# Patient Record
Sex: Male | Born: 1955
Health system: Southern US, Community
[De-identification: ages and names within clinical notes are randomized; demographics above are authoritative.]

## PROBLEM LIST (undated history)

## (undated) DIAGNOSIS — J449 Chronic obstructive pulmonary disease, unspecified: Secondary | ICD-10-CM

## (undated) DIAGNOSIS — C801 Malignant (primary) neoplasm, unspecified: Secondary | ICD-10-CM

## (undated) DIAGNOSIS — I10 Essential (primary) hypertension: Secondary | ICD-10-CM

## (undated) HISTORY — PX: HERNIA REPAIR: SHX51

## (undated) HISTORY — DX: Chronic obstructive pulmonary disease, unspecified: J44.9

## (undated) HISTORY — PX: APPENDECTOMY: SHX54

## (undated) MED FILL — Leucovorin Calcium For Inj 350 MG: INTRAMUSCULAR | Qty: 17.6 | Status: AC

---

## 2019-06-20 DIAGNOSIS — Z0000 Encounter for general adult medical examination without abnormal findings: Secondary | ICD-10-CM | POA: Diagnosis not present

## 2019-11-24 DIAGNOSIS — K219 Gastro-esophageal reflux disease without esophagitis: Secondary | ICD-10-CM | POA: Diagnosis not present

## 2019-11-24 DIAGNOSIS — R079 Chest pain, unspecified: Secondary | ICD-10-CM | POA: Diagnosis not present

## 2019-11-24 DIAGNOSIS — Z87891 Personal history of nicotine dependence: Secondary | ICD-10-CM | POA: Diagnosis not present

## 2019-11-24 DIAGNOSIS — R112 Nausea with vomiting, unspecified: Secondary | ICD-10-CM | POA: Diagnosis not present

## 2019-11-24 DIAGNOSIS — I10 Essential (primary) hypertension: Secondary | ICD-10-CM | POA: Diagnosis not present

## 2019-11-24 DIAGNOSIS — E785 Hyperlipidemia, unspecified: Secondary | ICD-10-CM | POA: Diagnosis not present

## 2019-11-24 DIAGNOSIS — R1013 Epigastric pain: Secondary | ICD-10-CM | POA: Diagnosis not present

## 2019-11-24 DIAGNOSIS — R9431 Abnormal electrocardiogram [ECG] [EKG]: Secondary | ICD-10-CM | POA: Diagnosis not present

## 2019-11-25 DIAGNOSIS — I10 Essential (primary) hypertension: Secondary | ICD-10-CM | POA: Diagnosis not present

## 2019-11-25 DIAGNOSIS — R1013 Epigastric pain: Secondary | ICD-10-CM | POA: Diagnosis not present

## 2019-11-25 DIAGNOSIS — R9431 Abnormal electrocardiogram [ECG] [EKG]: Secondary | ICD-10-CM | POA: Diagnosis not present

## 2019-11-25 DIAGNOSIS — R112 Nausea with vomiting, unspecified: Secondary | ICD-10-CM

## 2019-11-25 DIAGNOSIS — Z87891 Personal history of nicotine dependence: Secondary | ICD-10-CM | POA: Diagnosis not present

## 2019-11-25 DIAGNOSIS — E785 Hyperlipidemia, unspecified: Secondary | ICD-10-CM | POA: Diagnosis not present

## 2019-11-25 DIAGNOSIS — K219 Gastro-esophageal reflux disease without esophagitis: Secondary | ICD-10-CM | POA: Diagnosis not present

## 2019-11-25 DIAGNOSIS — Z9049 Acquired absence of other specified parts of digestive tract: Secondary | ICD-10-CM | POA: Diagnosis not present

## 2019-11-25 DIAGNOSIS — R079 Chest pain, unspecified: Secondary | ICD-10-CM | POA: Diagnosis not present

## 2019-11-29 DIAGNOSIS — K219 Gastro-esophageal reflux disease without esophagitis: Secondary | ICD-10-CM | POA: Diagnosis not present

## 2019-11-29 DIAGNOSIS — I10 Essential (primary) hypertension: Secondary | ICD-10-CM | POA: Diagnosis not present

## 2019-11-29 DIAGNOSIS — Z131 Encounter for screening for diabetes mellitus: Secondary | ICD-10-CM | POA: Diagnosis not present

## 2019-11-29 DIAGNOSIS — Z122 Encounter for screening for malignant neoplasm of respiratory organs: Secondary | ICD-10-CM | POA: Diagnosis not present

## 2019-11-29 DIAGNOSIS — R0789 Other chest pain: Secondary | ICD-10-CM | POA: Diagnosis not present

## 2019-11-29 DIAGNOSIS — Z6830 Body mass index (BMI) 30.0-30.9, adult: Secondary | ICD-10-CM | POA: Diagnosis not present

## 2019-11-29 DIAGNOSIS — E785 Hyperlipidemia, unspecified: Secondary | ICD-10-CM | POA: Diagnosis not present

## 2019-11-29 DIAGNOSIS — Z87891 Personal history of nicotine dependence: Secondary | ICD-10-CM | POA: Diagnosis not present

## 2019-12-07 DIAGNOSIS — Z87891 Personal history of nicotine dependence: Secondary | ICD-10-CM | POA: Diagnosis not present

## 2019-12-07 DIAGNOSIS — Z122 Encounter for screening for malignant neoplasm of respiratory organs: Secondary | ICD-10-CM | POA: Diagnosis not present

## 2020-01-02 DIAGNOSIS — K219 Gastro-esophageal reflux disease without esophagitis: Secondary | ICD-10-CM | POA: Diagnosis not present

## 2020-01-02 DIAGNOSIS — Z9181 History of falling: Secondary | ICD-10-CM | POA: Diagnosis not present

## 2020-01-02 DIAGNOSIS — Z23 Encounter for immunization: Secondary | ICD-10-CM | POA: Diagnosis not present

## 2020-01-02 DIAGNOSIS — Z6831 Body mass index (BMI) 31.0-31.9, adult: Secondary | ICD-10-CM | POA: Diagnosis not present

## 2020-01-03 DIAGNOSIS — D649 Anemia, unspecified: Secondary | ICD-10-CM | POA: Diagnosis not present

## 2020-01-04 DIAGNOSIS — K219 Gastro-esophageal reflux disease without esophagitis: Secondary | ICD-10-CM

## 2020-01-04 DIAGNOSIS — Z1211 Encounter for screening for malignant neoplasm of colon: Secondary | ICD-10-CM | POA: Diagnosis not present

## 2020-01-04 DIAGNOSIS — D649 Anemia, unspecified: Secondary | ICD-10-CM | POA: Diagnosis not present

## 2020-01-04 DIAGNOSIS — Z6831 Body mass index (BMI) 31.0-31.9, adult: Secondary | ICD-10-CM | POA: Diagnosis not present

## 2020-01-04 HISTORY — DX: Gastro-esophageal reflux disease without esophagitis: K21.9

## 2020-02-29 ENCOUNTER — Encounter: Payer: Self-pay | Admitting: Family Medicine

## 2020-07-09 DIAGNOSIS — Z125 Encounter for screening for malignant neoplasm of prostate: Secondary | ICD-10-CM | POA: Diagnosis not present

## 2020-07-09 DIAGNOSIS — E785 Hyperlipidemia, unspecified: Secondary | ICD-10-CM | POA: Diagnosis not present

## 2020-07-09 DIAGNOSIS — Z79899 Other long term (current) drug therapy: Secondary | ICD-10-CM | POA: Diagnosis not present

## 2020-07-09 DIAGNOSIS — Z1211 Encounter for screening for malignant neoplasm of colon: Secondary | ICD-10-CM | POA: Diagnosis not present

## 2020-07-09 DIAGNOSIS — K219 Gastro-esophageal reflux disease without esophagitis: Secondary | ICD-10-CM | POA: Diagnosis not present

## 2020-07-09 DIAGNOSIS — Z131 Encounter for screening for diabetes mellitus: Secondary | ICD-10-CM | POA: Diagnosis not present

## 2020-07-09 DIAGNOSIS — Z1331 Encounter for screening for depression: Secondary | ICD-10-CM | POA: Diagnosis not present

## 2020-07-09 DIAGNOSIS — E669 Obesity, unspecified: Secondary | ICD-10-CM | POA: Diagnosis not present

## 2020-07-09 DIAGNOSIS — Z0000 Encounter for general adult medical examination without abnormal findings: Secondary | ICD-10-CM | POA: Diagnosis not present

## 2020-07-09 DIAGNOSIS — I10 Essential (primary) hypertension: Secondary | ICD-10-CM | POA: Diagnosis not present

## 2020-07-09 DIAGNOSIS — Z6831 Body mass index (BMI) 31.0-31.9, adult: Secondary | ICD-10-CM | POA: Diagnosis not present

## 2020-07-31 ENCOUNTER — Encounter: Payer: Self-pay | Admitting: Gastroenterology

## 2020-08-12 ENCOUNTER — Other Ambulatory Visit: Payer: Self-pay | Admitting: General Surgery

## 2020-08-20 ENCOUNTER — Other Ambulatory Visit (INDEPENDENT_AMBULATORY_CARE_PROVIDER_SITE_OTHER): Payer: PPO

## 2020-08-20 ENCOUNTER — Other Ambulatory Visit: Payer: PPO

## 2020-08-20 ENCOUNTER — Other Ambulatory Visit: Payer: Self-pay

## 2020-08-20 ENCOUNTER — Encounter: Payer: Self-pay | Admitting: Gastroenterology

## 2020-08-20 ENCOUNTER — Ambulatory Visit (INDEPENDENT_AMBULATORY_CARE_PROVIDER_SITE_OTHER): Payer: PPO | Admitting: Gastroenterology

## 2020-08-20 VITALS — BP 154/86 | HR 73 | Ht 62.0 in | Wt 179.5 lb

## 2020-08-20 DIAGNOSIS — Z1212 Encounter for screening for malignant neoplasm of rectum: Secondary | ICD-10-CM | POA: Diagnosis not present

## 2020-08-20 DIAGNOSIS — D509 Iron deficiency anemia, unspecified: Secondary | ICD-10-CM

## 2020-08-20 DIAGNOSIS — K219 Gastro-esophageal reflux disease without esophagitis: Secondary | ICD-10-CM | POA: Diagnosis not present

## 2020-08-20 DIAGNOSIS — R195 Other fecal abnormalities: Secondary | ICD-10-CM | POA: Diagnosis not present

## 2020-08-20 DIAGNOSIS — Z1211 Encounter for screening for malignant neoplasm of colon: Secondary | ICD-10-CM

## 2020-08-20 LAB — CBC
HCT: 34.4 % — ABNORMAL LOW (ref 38.5–50.0)
Hemoglobin: 12.7 g/dL — ABNORMAL LOW (ref 13.2–17.1)
MCH: 34 pg — ABNORMAL HIGH (ref 27.0–33.0)
MCHC: 36.9 g/dL — ABNORMAL HIGH (ref 32.0–36.0)
MCV: 92 fL (ref 80.0–100.0)
MPV: 10.2 fL (ref 7.5–12.5)
Platelets: 303 10*3/uL (ref 140–400)
RBC: 3.74 10*6/uL — ABNORMAL LOW (ref 4.20–5.80)
RDW: 14.1 % (ref 11.0–15.0)
WBC: 9.6 10*3/uL (ref 3.8–10.8)

## 2020-08-20 LAB — FERRITIN: Ferritin: 67.9 ng/mL (ref 22.0–322.0)

## 2020-08-20 MED ORDER — CLENPIQ 10-3.5-12 MG-GM -GM/160ML PO SOLN: 1.0000 | ORAL | 0 refills | Status: DC

## 2020-08-20 NOTE — Progress Notes (Signed)
Chief Complaint: Colon cancer screening, GERD   Referring Provider:     Serita Grammes, MD   HPI:     Alexander [name] is a 65 y.o. male with a history of COPD, HLD, HTN, GERD, referred to the Gastroenterology Clinic for evaluation of colon cancer screening and chronic GERD.  No previous colonoscopy.   Had noticed black stool approx 3 weeks ago, lasted a couple days, which then resolved.  No Pepto, p.o. iron, etc. at that time.  Does use Advil for arthralgias and LBP.    He has a long-standing hx of GERD, which had been largely well controlled. More recently with increasing nocturnal sxs (belching, regurgitation, sour brash). Also with increased daytime belching, HB, regurgitation. No dysphagia. Occasional nausea, but no emesis. Has been taking Protonix 40 mg/day for 1 year. No prior similar sxs. No previous EGD.   He reports he was diagnosed with IDA in 2021, which was normal on repeat a few months later with his PCM. Never took any supplemental iron. No labs for review.  Requested records today.  Evaluated by Cardiology in 11/2019: -TTE (11/25/2019): Concentric LVH, EF 60-65%, mild aortic valve sclerosis -CT chest (12/07/2019): Lung RADS 2, benign appearance.  Continued annual screening with CT without contrast in 12 months.  Hepatic steatosis.  Aortic atherosclerosis and emphysema.  Coronary artery atherosclerosis.  Aortic valve calcifications. -Lexiscan cardiac stress test (11/25/2018): Uneventful Lexiscan infusion, EKG changes at rest most likely related to LVH.  No reversible ischemia or infarction.  EF 70%.   Past Medical History:  Diagnosis Date  . COPD (chronic obstructive pulmonary disease) ([place])   . GERD without esophagitis 01/04/2020   [facility]     Past Surgical History:  Procedure Laterality Date  . APPENDECTOMY    . HERNIA REPAIR     age 46 or 2    Family History  Problem Relation Age of Onset  . Diabetes Sister   .  Hypertension Brother   . Cancer Neg Hx   . Stroke Neg Hx   . Heart attack Neg Hx    Social History   Tobacco Use  . Smoking status: Former Smoker    Types: Cigars    Quit date: 04/2019    Years since quitting: 1.3  . Smokeless tobacco: Never Used  Substance Use Topics  . Alcohol use: Not Currently    Comment: doesnt drink anymore. stopped in 2021  . Drug use: Not Currently   Current Outpatient Medications  Medication Sig Dispense Refill  . losartan (COZAAR) 25 MG tablet Take 25 mg by mouth every morning.    . pantoprazole (PROTONIX) 40 MG tablet Take 40 mg by mouth at bedtime.    . rosuvastatin (CRESTOR) 10 MG tablet Take 10 mg by mouth every morning.     No current facility-administered medications for this visit.   No Known Allergies   Review of Systems: All systems reviewed and negative except where noted in HPI.     Physical Exam:    Wt Readings from Last 3 Encounters:  08/20/20 179 lb 8 oz (81.4 kg)    BP (!) 154/86   Pulse 73   Ht 5\' 2"  (1.575 m)   Wt 179 lb 8 oz (81.4 kg)   BMI 32.83 kg/m  Constitutional:  Pleasant, in no acute distress. Psychiatric: Normal mood and affect. Behavior is normal. EENT: Pupils normal.  Conjunctivae are normal.  No scleral icterus. Neck supple. No cervical LAD. Cardiovascular: Normal rate, regular rhythm. No edema Pulmonary/chest: Effort normal and breath sounds normal. No wheezing, rales or rhonchi. Abdominal: Soft, nondistended, nontender. Bowel sounds active throughout. There are no masses palpable. No hepatomegaly. Neurological: Alert and oriented to person place and time. Skin: Skin is warm and dry. No rashes noted.   ASSESSMENT AND PLAN;   1) colon cancer screening -Overdue for average risk screening -Schedule colonoscopy  2) GERD 3) Nausea without emesis -Longstanding history of reflux, previously well controlled, but increasing nocturnal reflux symptoms lately despite continued Protonix 40 mg/day -EGD to  evaluate for erosive esophagitis, LES laxity, hiatal hernia, Barrett's esophagus screening -Antireflux lifestyle/dietary modifications -HOB elevation, avoid eating within 3 hours of bedtime -Avoid exacerbating foods  4) Dark stools -Isolated episode lasting a couple days of dark stools approximately 3 weeks ago.  Does take NSAIDs regularly for arthralgias -Evaluate for gastritis, PUD, etc. time EGD as above  5) Possible history of iron deficiency anemia -Requested previous records -Repeat CBC and check iron panel -Evaluate for callosal/luminal pathology time EGD/colonoscopy as above  The indications, risks, and benefits of EGD and colonoscopy were explained to the patient in detail. Risks include but are not limited to bleeding, perforation, adverse reaction to medications, and cardiopulmonary compromise. Sequelae include but are not limited to the possibility of surgery, hospitalization, and mortality. The patient verbalized understanding and wished to proceed. All questions answered, referred to scheduler and bowel prep ordered. Further recommendations pending results of the exam.     Alexander Bullion, DO, FACG  08/20/2020, 11:10 AM   Serita Grammes, MD

## 2020-08-20 NOTE — Patient Instructions (Addendum)
If you are age 66 or older, your body mass index should be between 23-30. Your Body mass index is 32.83 kg/m. If this is out of the aforementioned range listed, please consider follow up with your Primary Care Provider.  If you are age 78 or younger, your body mass index should be between 19-25. Your Body mass index is 32.83 kg/m. If this is out of the aformentioned range listed, please consider follow up with your Primary Care Provider.    Please go to the 2nd floor of this building today and schedule your labwork, [place], [address].  We have sent the following medications to your pharmacy for you to pick up at your convenience:  Clenpiq   Due to recent changes in healthcare laws, you may see the results of your imaging and laboratory studies on MyChart before your provider has had a chance to review them.  We understand that in some cases there may be results that are confusing or concerning to you. Not all laboratory results come back in the same time frame and the provider may be waiting for multiple results in order to interpret others.  Please give [place] 48 hours in order for your provider to thoroughly review all the results before contacting the office for clarification of your results.   Thank you for choosing me and [facility].  Vito Cirigliano, D.O.

## 2020-08-21 ENCOUNTER — Telehealth: Payer: Self-pay | Admitting: Gastroenterology

## 2020-08-21 LAB — IRON, TOTAL/TOTAL IRON BINDING CAP
%SAT: 21 % (calc) (ref 20–48)
Iron: 88 ug/dL (ref 50–180)
TIBC: 422 mcg/dL (calc) (ref 250–425)

## 2020-08-21 NOTE — Telephone Encounter (Signed)
LVM for the patients wife to contact me back. Left a sample of Clenpiq the front desk for the patient to pick up

## 2020-08-22 ENCOUNTER — Telehealth: Payer: Self-pay | Admitting: General Surgery

## 2020-08-22 NOTE — Telephone Encounter (Signed)
-----   Message from [place], DO sent at 08/21/2020  4:30 PM EDT ----- Hemoglobin very mildly reduced at 12.7, but otherwise normal CBC.  Normal iron panel.  No need to start iron therapy.

## 2020-08-22 NOTE — Telephone Encounter (Signed)
Left a voicemail for the patient advising him that he hand a mildly reduced hemoglobin. No need to start iron. They also had requested a cheaper prep for his colonoscopy. Left a prep at the front desk in [place] for him.

## 2020-09-05 DIAGNOSIS — K5730 Diverticulosis of large intestine without perforation or abscess without bleeding: Secondary | ICD-10-CM | POA: Diagnosis not present

## 2020-09-05 DIAGNOSIS — Z8249 Family history of ischemic heart disease and other diseases of the circulatory system: Secondary | ICD-10-CM | POA: Diagnosis not present

## 2020-09-05 DIAGNOSIS — R1084 Generalized abdominal pain: Secondary | ICD-10-CM | POA: Diagnosis not present

## 2020-09-05 DIAGNOSIS — C169 Malignant neoplasm of stomach, unspecified: Secondary | ICD-10-CM | POA: Diagnosis present

## 2020-09-05 DIAGNOSIS — D696 Thrombocytopenia, unspecified: Secondary | ICD-10-CM | POA: Diagnosis not present

## 2020-09-05 DIAGNOSIS — Z20822 Contact with and (suspected) exposure to covid-19: Secondary | ICD-10-CM | POA: Diagnosis not present

## 2020-09-05 DIAGNOSIS — K3189 Other diseases of stomach and duodenum: Secondary | ICD-10-CM | POA: Diagnosis not present

## 2020-09-05 DIAGNOSIS — D649 Anemia, unspecified: Secondary | ICD-10-CM | POA: Diagnosis not present

## 2020-09-05 DIAGNOSIS — D72829 Elevated white blood cell count, unspecified: Secondary | ICD-10-CM | POA: Diagnosis not present

## 2020-09-05 DIAGNOSIS — K259 Gastric ulcer, unspecified as acute or chronic, without hemorrhage or perforation: Secondary | ICD-10-CM | POA: Diagnosis not present

## 2020-09-05 DIAGNOSIS — K222 Esophageal obstruction: Secondary | ICD-10-CM | POA: Diagnosis not present

## 2020-09-05 DIAGNOSIS — Z6830 Body mass index (BMI) 30.0-30.9, adult: Secondary | ICD-10-CM | POA: Diagnosis not present

## 2020-09-05 DIAGNOSIS — E669 Obesity, unspecified: Secondary | ICD-10-CM | POA: Diagnosis not present

## 2020-09-05 DIAGNOSIS — E441 Mild protein-calorie malnutrition: Secondary | ICD-10-CM | POA: Diagnosis not present

## 2020-09-05 DIAGNOSIS — R079 Chest pain, unspecified: Secondary | ICD-10-CM | POA: Diagnosis not present

## 2020-09-05 DIAGNOSIS — R1013 Epigastric pain: Secondary | ICD-10-CM | POA: Diagnosis not present

## 2020-09-05 DIAGNOSIS — Z87891 Personal history of nicotine dependence: Secondary | ICD-10-CM | POA: Diagnosis not present

## 2020-09-05 DIAGNOSIS — Z808 Family history of malignant neoplasm of other organs or systems: Secondary | ICD-10-CM | POA: Diagnosis not present

## 2020-09-05 DIAGNOSIS — R932 Abnormal findings on diagnostic imaging of liver and biliary tract: Secondary | ICD-10-CM | POA: Diagnosis not present

## 2020-09-05 DIAGNOSIS — E785 Hyperlipidemia, unspecified: Secondary | ICD-10-CM | POA: Diagnosis not present

## 2020-09-05 DIAGNOSIS — K5669 Other partial intestinal obstruction: Secondary | ICD-10-CM | POA: Diagnosis not present

## 2020-09-05 DIAGNOSIS — M1990 Unspecified osteoarthritis, unspecified site: Secondary | ICD-10-CM | POA: Diagnosis not present

## 2020-09-05 DIAGNOSIS — K219 Gastro-esophageal reflux disease without esophagitis: Secondary | ICD-10-CM | POA: Diagnosis not present

## 2020-09-05 DIAGNOSIS — E46 Unspecified protein-calorie malnutrition: Secondary | ICD-10-CM | POA: Diagnosis not present

## 2020-09-05 DIAGNOSIS — K311 Adult hypertrophic pyloric stenosis: Secondary | ICD-10-CM | POA: Diagnosis not present

## 2020-09-05 DIAGNOSIS — K264 Chronic or unspecified duodenal ulcer with hemorrhage: Secondary | ICD-10-CM | POA: Diagnosis not present

## 2020-09-05 DIAGNOSIS — R112 Nausea with vomiting, unspecified: Secondary | ICD-10-CM | POA: Diagnosis not present

## 2020-09-05 DIAGNOSIS — K254 Chronic or unspecified gastric ulcer with hemorrhage: Secondary | ICD-10-CM | POA: Diagnosis not present

## 2020-09-05 DIAGNOSIS — I10 Essential (primary) hypertension: Secondary | ICD-10-CM | POA: Diagnosis not present

## 2020-09-05 DIAGNOSIS — Z4682 Encounter for fitting and adjustment of non-vascular catheter: Secondary | ICD-10-CM | POA: Diagnosis not present

## 2020-09-05 DIAGNOSIS — Z6831 Body mass index (BMI) 31.0-31.9, adult: Secondary | ICD-10-CM | POA: Diagnosis not present

## 2020-09-05 DIAGNOSIS — E876 Hypokalemia: Secondary | ICD-10-CM | POA: Diagnosis not present

## 2020-09-05 DIAGNOSIS — Z79899 Other long term (current) drug therapy: Secondary | ICD-10-CM | POA: Diagnosis not present

## 2020-09-05 DIAGNOSIS — K922 Gastrointestinal hemorrhage, unspecified: Secondary | ICD-10-CM | POA: Diagnosis not present

## 2020-09-05 DIAGNOSIS — K760 Fatty (change of) liver, not elsewhere classified: Secondary | ICD-10-CM | POA: Diagnosis not present

## 2020-09-05 DIAGNOSIS — Z806 Family history of leukemia: Secondary | ICD-10-CM | POA: Diagnosis not present

## 2020-09-05 DIAGNOSIS — C7889 Secondary malignant neoplasm of other digestive organs: Secondary | ICD-10-CM | POA: Diagnosis not present

## 2020-09-05 DIAGNOSIS — E871 Hypo-osmolality and hyponatremia: Secondary | ICD-10-CM | POA: Diagnosis not present

## 2020-09-05 DIAGNOSIS — D500 Iron deficiency anemia secondary to blood loss (chronic): Secondary | ICD-10-CM | POA: Diagnosis not present

## 2020-09-05 DIAGNOSIS — Z833 Family history of diabetes mellitus: Secondary | ICD-10-CM | POA: Diagnosis not present

## 2020-09-05 DIAGNOSIS — Z23 Encounter for immunization: Secondary | ICD-10-CM | POA: Diagnosis present

## 2020-09-05 DIAGNOSIS — J449 Chronic obstructive pulmonary disease, unspecified: Secondary | ICD-10-CM | POA: Diagnosis not present

## 2020-09-05 DIAGNOSIS — C163 Malignant neoplasm of pyloric antrum: Secondary | ICD-10-CM | POA: Diagnosis not present

## 2020-09-12 ENCOUNTER — Inpatient Hospital Stay (HOSPITAL_COMMUNITY): Payer: PPO

## 2020-09-12 ENCOUNTER — Encounter (HOSPITAL_COMMUNITY): Payer: Self-pay | Admitting: Internal Medicine

## 2020-09-12 ENCOUNTER — Inpatient Hospital Stay (HOSPITAL_COMMUNITY)
Admission: AD | Admit: 2020-09-12 | Discharge: 2020-10-02 | DRG: 326 | Disposition: A | Discharge status: Home / Self Care | Payer: PPO | Source: Other Acute Inpatient Hospital | Attending: Internal Medicine | Admitting: Internal Medicine

## 2020-09-12 DIAGNOSIS — Z6831 Body mass index (BMI) 31.0-31.9, adult: Secondary | ICD-10-CM | POA: Diagnosis not present

## 2020-09-12 DIAGNOSIS — Z87891 Personal history of nicotine dependence: Secondary | ICD-10-CM | POA: Diagnosis not present

## 2020-09-12 DIAGNOSIS — J449 Chronic obstructive pulmonary disease, unspecified: Secondary | ICD-10-CM | POA: Diagnosis present

## 2020-09-12 DIAGNOSIS — R059 Cough, unspecified: Secondary | ICD-10-CM | POA: Diagnosis not present

## 2020-09-12 DIAGNOSIS — Z833 Family history of diabetes mellitus: Secondary | ICD-10-CM

## 2020-09-12 DIAGNOSIS — E46 Unspecified protein-calorie malnutrition: Secondary | ICD-10-CM | POA: Diagnosis not present

## 2020-09-12 DIAGNOSIS — K5669 Other partial intestinal obstruction: Secondary | ICD-10-CM | POA: Diagnosis not present

## 2020-09-12 DIAGNOSIS — Z20822 Contact with and (suspected) exposure to covid-19: Secondary | ICD-10-CM | POA: Diagnosis not present

## 2020-09-12 DIAGNOSIS — K254 Chronic or unspecified gastric ulcer with hemorrhage: Secondary | ICD-10-CM | POA: Diagnosis not present

## 2020-09-12 DIAGNOSIS — E876 Hypokalemia: Secondary | ICD-10-CM | POA: Diagnosis not present

## 2020-09-12 DIAGNOSIS — K760 Fatty (change of) liver, not elsewhere classified: Secondary | ICD-10-CM | POA: Diagnosis not present

## 2020-09-12 DIAGNOSIS — Z0189 Encounter for other specified special examinations: Secondary | ICD-10-CM

## 2020-09-12 DIAGNOSIS — E669 Obesity, unspecified: Secondary | ICD-10-CM | POA: Diagnosis not present

## 2020-09-12 DIAGNOSIS — C169 Malignant neoplasm of stomach, unspecified: Secondary | ICD-10-CM | POA: Diagnosis not present

## 2020-09-12 DIAGNOSIS — E441 Mild protein-calorie malnutrition: Secondary | ICD-10-CM | POA: Diagnosis not present

## 2020-09-12 DIAGNOSIS — Z808 Family history of malignant neoplasm of other organs or systems: Secondary | ICD-10-CM | POA: Diagnosis not present

## 2020-09-12 DIAGNOSIS — Z23 Encounter for immunization: Secondary | ICD-10-CM | POA: Diagnosis not present

## 2020-09-12 DIAGNOSIS — Z8249 Family history of ischemic heart disease and other diseases of the circulatory system: Secondary | ICD-10-CM | POA: Diagnosis not present

## 2020-09-12 DIAGNOSIS — Z85028 Personal history of other malignant neoplasm of stomach: Secondary | ICD-10-CM | POA: Diagnosis not present

## 2020-09-12 DIAGNOSIS — E871 Hypo-osmolality and hyponatremia: Secondary | ICD-10-CM | POA: Diagnosis not present

## 2020-09-12 DIAGNOSIS — E785 Hyperlipidemia, unspecified: Secondary | ICD-10-CM | POA: Diagnosis not present

## 2020-09-12 DIAGNOSIS — D696 Thrombocytopenia, unspecified: Secondary | ICD-10-CM | POA: Diagnosis not present

## 2020-09-12 DIAGNOSIS — C7889 Secondary malignant neoplasm of other digestive organs: Secondary | ICD-10-CM | POA: Diagnosis not present

## 2020-09-12 DIAGNOSIS — R0602 Shortness of breath: Secondary | ICD-10-CM | POA: Diagnosis not present

## 2020-09-12 DIAGNOSIS — R112 Nausea with vomiting, unspecified: Secondary | ICD-10-CM

## 2020-09-12 DIAGNOSIS — I10 Essential (primary) hypertension: Secondary | ICD-10-CM | POA: Diagnosis not present

## 2020-09-12 DIAGNOSIS — Z452 Encounter for adjustment and management of vascular access device: Secondary | ICD-10-CM | POA: Diagnosis not present

## 2020-09-12 DIAGNOSIS — D500 Iron deficiency anemia secondary to blood loss (chronic): Secondary | ICD-10-CM | POA: Diagnosis not present

## 2020-09-12 DIAGNOSIS — Z6830 Body mass index (BMI) 30.0-30.9, adult: Secondary | ICD-10-CM

## 2020-09-12 DIAGNOSIS — K311 Adult hypertrophic pyloric stenosis: Secondary | ICD-10-CM | POA: Diagnosis not present

## 2020-09-12 DIAGNOSIS — K219 Gastro-esophageal reflux disease without esophagitis: Secondary | ICD-10-CM | POA: Diagnosis present

## 2020-09-12 DIAGNOSIS — Z806 Family history of leukemia: Secondary | ICD-10-CM | POA: Diagnosis not present

## 2020-09-12 DIAGNOSIS — D649 Anemia, unspecified: Secondary | ICD-10-CM | POA: Diagnosis not present

## 2020-09-12 DIAGNOSIS — Z4682 Encounter for fitting and adjustment of non-vascular catheter: Secondary | ICD-10-CM | POA: Diagnosis not present

## 2020-09-12 DIAGNOSIS — R1114 Bilious vomiting: Secondary | ICD-10-CM | POA: Diagnosis not present

## 2020-09-12 LAB — COMPREHENSIVE METABOLIC PANEL
ALT: 33 U/L (ref 0–44)
AST: 28 U/L (ref 15–41)
Albumin: 3.4 g/dL — ABNORMAL LOW (ref 3.5–5.0)
Alkaline Phosphatase: 68 U/L (ref 38–126)
Anion gap: 13 (ref 5–15)
BUN: 9 mg/dL (ref 8–23)
CO2: 18 mmol/L — ABNORMAL LOW (ref 22–32)
Calcium: 9.1 mg/dL (ref 8.9–10.3)
Chloride: 108 mmol/L (ref 98–111)
Creatinine, Ser: 0.99 mg/dL (ref 0.61–1.24)
GFR, Estimated: >60 mL/min (ref >60)
Glucose, Bld: 107 mg/dL — ABNORMAL HIGH (ref 70–99)
Potassium: 3.8 mmol/L (ref 3.5–5.1)
Sodium: 139 mmol/L (ref 135–145)
Total Bilirubin: 1.3 mg/dL — ABNORMAL HIGH (ref 0.3–1.2)
Total Protein: 6.6 g/dL (ref 6.5–8.1)

## 2020-09-12 LAB — CBC WITH DIFFERENTIAL/PLATELET
Abs Immature Granulocytes: 0.11 10*3/uL — ABNORMAL HIGH (ref 0.00–0.07)
Basophils Absolute: 0.1 10*3/uL (ref 0.0–0.1)
Basophils Relative: 1 %
Eosinophils Absolute: 0.2 10*3/uL (ref 0.0–0.5)
Eosinophils Relative: 1 %
HCT: 35.4 % — ABNORMAL LOW (ref 39.0–52.0)
Hemoglobin: 12 g/dL — ABNORMAL LOW (ref 13.0–17.0)
Immature Granulocytes: 1 %
Lymphocytes Relative: 16 %
Lymphs Abs: 2.1 10*3/uL (ref 0.7–4.0)
MCH: 32 pg (ref 26.0–34.0)
MCHC: 33.9 g/dL (ref 30.0–36.0)
MCV: 94.4 fL (ref 80.0–100.0)
Monocytes Absolute: 1.2 10*3/uL — ABNORMAL HIGH (ref 0.1–1.0)
Monocytes Relative: 9 %
Neutro Abs: 9.6 10*3/uL — ABNORMAL HIGH (ref 1.7–7.7)
Neutrophils Relative %: 72 %
Platelets: 360 10*3/uL (ref 150–400)
RBC: 3.75 MIL/uL — ABNORMAL LOW (ref 4.22–5.81)
RDW: 14.9 % (ref 11.5–15.5)
WBC: 13.2 10*3/uL — ABNORMAL HIGH (ref 4.0–10.5)
nRBC: 0 % (ref 0.0–0.2)

## 2020-09-12 LAB — MAGNESIUM: Magnesium: 2 mg/dL (ref 1.7–2.4)

## 2020-09-12 MED ORDER — POTASSIUM CHLORIDE 2 MEQ/ML IV SOLN
INTRAVENOUS | Status: DC
  Filled 2020-09-12: qty 1000

## 2020-09-12 MED ORDER — ACETAMINOPHEN 650 MG RE SUPP: 650.0000 mg | Freq: Four times a day (QID) | RECTAL | Status: DC | PRN

## 2020-09-12 MED ORDER — ONDANSETRON HCL 4 MG/2ML IJ SOLN
4.0000 mg | Freq: Four times a day (QID) | INTRAMUSCULAR | Status: DC | PRN
  Administered 2020-09-17 – 2020-10-02 (×8): 4 mg via INTRAVENOUS
  Filled 2020-09-12 (×9): qty 2

## 2020-09-12 MED ORDER — LABETALOL HCL 5 MG/ML IV SOLN
10.0000 mg | INTRAVENOUS | Status: DC | PRN
  Administered 2020-09-13: 10 mg via INTRAVENOUS
  Filled 2020-09-12: qty 4

## 2020-09-12 MED ORDER — PANTOPRAZOLE SODIUM 40 MG IV SOLR
40.0000 mg | Freq: Two times a day (BID) | INTRAVENOUS | Status: DC
  Administered 2020-09-13 – 2020-09-26 (×27): 40 mg via INTRAVENOUS
  Filled 2020-09-12 (×27): qty 40

## 2020-09-12 MED ORDER — ONDANSETRON HCL 4 MG PO TABS: 4.0000 mg | ORAL_TABLET | Freq: Four times a day (QID) | ORAL | Status: DC | PRN

## 2020-09-12 MED ORDER — ACETAMINOPHEN 325 MG PO TABS: 650.0000 mg | ORAL_TABLET | Freq: Four times a day (QID) | ORAL | Status: DC | PRN

## 2020-09-12 NOTE — H&P (Signed)
History and Physical    Alexander [name] [name] GOT:[id number] DOB: [birth year] DOA: 09/12/2020  PCP: Serita Grammes, MD  Patient coming from: [facility]  I have personally briefly reviewed patient's old medical records in [place]  Chief Complaint: Gastric malignancy  HPI: Alexander [name] is a 65 y.o. male with medical history significant for COPD, HTN, HLD, GERD who was admitted from [facility] for management of newly diagnosed gastric malignancy with gastric outlet obstruction.  Physical chart/records from [facility] reviewed.  Initially admitted at [facility] 4/21 for abdominal bloating with 2 episodes of large-volume emesis, and melena. CT Abd/Pelvis showed nodular thickening of the distal gastric mucosa involving antrum and pylorus concerning for infiltrative neoplasm w/ associated gastric outlet obstruction. Patient was initially treated with IV PPI, IV octreotide. He did not require blood transfusion during his hospital stay.  GI were consulted and he underwent EGD on 4/22. 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 Biopsy was performed. NG tube was placed post EGD and he has maintained n.p.o. status.   Pathology resulted today 4/28 consistent with adenocarcinoma. Recommendation was to transfer to [facility] for surgery evaluation.  On arrival, patient is feeling well.  He did not have any further nausea, vomiting, or abdominal discomfort.  He says he did have output via his NG tube earlier today.  He has not had any recent bowel movement which he attributes to not eating anything over the last week.  He has not seen any further obvious bleeding since he was initially admitted at [facility].  He otherwise denies any chest pain, dyspnea, or dysuria.  He reports former tobacco use of 35-40 pack years.  Quit 1.5 years ago.  Also former  alcohol use, sixpack of beer on almost daily basis.  He quit drinking alcohol about a year ago.  He reports a history of brain cancer in his father.  Review of Systems: All systems reviewed and are negative except as documented in history of present illness above.   Past Medical History:  Diagnosis Date  . COPD (chronic obstructive pulmonary disease) ([place])   . GERD without esophagitis 01/04/2020   [facility]    Past Surgical History:  Procedure Laterality Date  . APPENDECTOMY    . HERNIA REPAIR     age 89 or 2     Social History:  reports that he quit smoking about 16 months ago. His smoking use included cigars. He has never used smokeless tobacco. He reports previous alcohol use. He reports previous drug use.  No Known Allergies  Family History  Problem Relation Age of Onset  . Diabetes Sister   . Hypertension Brother   . Brain cancer Father   . Cancer Neg Hx   . Stroke Neg Hx   . Heart attack Neg Hx      Prior to Admission medications   Medication Sig Start Date End Date Taking? Authorizing Provider  losartan (COZAAR) 25 MG tablet Take 25 mg by mouth every morning. 06/25/20   [provider]  pantoprazole (PROTONIX) 40 MG tablet Take 40 mg by mouth at bedtime. 06/25/20   [provider]  rosuvastatin (CRESTOR) 10 MG tablet Take 10 mg by mouth every morning. 06/25/20   [provider]  Sod Picosulfate-Mag Ox-Cit Acd (CLENPIQ) 10-3.5-12 MG-GM -GM/160ML SOLN Take 1 kit by mouth as directed. 08/20/20   Cirigliano, Luanna Salk  V, DO    Physical Exam: Vitals:   09/12/20 2056  BP: (!) 174/94  Pulse: 81  Resp: 18  Temp: 97.6 F (36.4 C)  TempSrc: Oral  SpO2: 98%   Constitutional: Resting in bed with head elevated, NAD, calm, comfortable Eyes: PERRL, lids and conjunctivae normal ENMT: NG tube in place right nares.  Mucous membranes are moist. Posterior pharynx clear of any exudate or lesions.Normal dentition.  Neck: normal, supple, no  masses. Respiratory: clear to auscultation bilaterally, no wheezing, no crackles. Normal respiratory effort. No accessory muscle use.  Cardiovascular: Regular rate and rhythm, no murmurs / rubs / gallops. No extremity edema. 2+ pedal pulses. Abdomen: no tenderness, no masses palpated. No hepatosplenomegaly. Bowel sounds diminished.  Musculoskeletal: no clubbing / cyanosis. No joint deformity upper and lower extremities. Good ROM, no contractures. Normal muscle tone.  Skin: no rashes, lesions, ulcers. No induration Neurologic: CN 2-12 grossly intact. Sensation intact. Strength 5/5 in all 4.  Psychiatric: Normal judgment and insight. Alert and oriented x 3. Normal mood.  Labs on Admission: I have personally reviewed following labs and imaging studies  CBC: No results for input(s): WBC, NEUTROABS, HGB, HCT, MCV, PLT in the last 168 hours. Basic Metabolic Panel: Recent Labs  Lab 09/12/20 2135  NA 139  K 3.8  CL 108  CO2 18*  GLUCOSE 107*  BUN 9  CREATININE 0.99  CALCIUM 9.1  MG 2.0   GFR: CrCl cannot be calculated (Unknown ideal weight.). Liver Function Tests: Recent Labs  Lab 09/12/20 2135  AST 28  ALT 33  ALKPHOS 68  BILITOT 1.3*  PROT 6.6  ALBUMIN 3.4*   No results for input(s): LIPASE, AMYLASE in the last 168 hours. No results for input(s): AMMONIA in the last 168 hours. Coagulation Profile: No results for input(s): INR, PROTIME in the last 168 hours. Cardiac Enzymes: No results for input(s): CKTOTAL, CKMB, CKMBINDEX, TROPONINI in the last 168 hours. BNP (last 3 results) No results for input(s): PROBNP in the last 8760 hours. HbA1C: No results for input(s): HGBA1C in the last 72 hours. CBG: No results for input(s): GLUCAP in the last 168 hours. Lipid Profile: No results for input(s): CHOL, HDL, LDLCALC, TRIG, CHOLHDL, LDLDIRECT in the last 72 hours. Thyroid Function Tests: No results for input(s): TSH, T4TOTAL, FREET4, T3FREE, THYROIDAB in the last 72  hours. Anemia Panel: No results for input(s): VITAMINB12, FOLATE, FERRITIN, TIBC, IRON, RETICCTPCT in the last 72 hours. Urine analysis: No results found for: COLORURINE, APPEARANCEUR, LABSPEC, PHURINE, GLUCOSEU, HGBUR, BILIRUBINUR, KETONESUR, PROTEINUR, UROBILINOGEN, NITRITE, LEUKOCYTESUR  Radiological Exams on Admission: No results found.  EKG: Not performed.  Assessment/Plan Principal Problem:   Gastric adenocarcinoma (HCC) Active Problems:   Gastric outlet obstruction   Essential hypertension   THADDEUS EVITTS [name] is a 65 y.o. male with medical history significant for COPD, HTN, HLD, GERD who is admitted with gastric adenocarcinoma with associated gastric outlet obstruction.  Ulcerative gastric adenocarcinoma with associated gastric outlet obstruction: Transfer from [place] EGD 4/22 which showed a large antral ulcer with GOO.  Pathology resulted 4/28 showing adenocarcinoma. -Keep n.p.o. -Check KUB -Continue NG tube with low intermittent suction -Continue IV Protonix 40 mg twice daily -Continue maintenance IV fluids overnight -Will need to consult general surgery and oncology in a.m.  GI bleed: Present at initial admission to [facility] due to gastric ulcer.  Did not require blood transfusion.  Continue to monitor hemoglobin and transfuse if <7.0 or has large volume GI bleeding.  Continue IV PPI.  Hypertension: Home antihypertensives on hold while NPO.  Continue IV labetalol as needed.  DVT prophylaxis: SCDs Code Status: Full code, confirmed with patient Family Communication: Discussed with patient, he has discussed with family Disposition Plan: Likely discharge to home pending further surgical and oncological evaluation Consults called: Will need general surgery and oncology consult in a.m. Level of care: Med-Surg Admission status:  Status is: Inpatient  Remains inpatient appropriate because:Ongoing diagnostic testing needed not appropriate  for outpatient work up  Dispo: The patient is from: Home              Anticipated d/c is to: Home              Patient currently is not medically stable to d/c.   Difficult to place patient No  Zada Finders MD [facility]  If 7PM-7AM, please contact night-coverage [email or web address]  09/12/2020, 10:46 PM

## 2020-09-13 ENCOUNTER — Inpatient Hospital Stay: Payer: Self-pay

## 2020-09-13 ENCOUNTER — Encounter (HOSPITAL_COMMUNITY): Payer: Self-pay | Admitting: Internal Medicine

## 2020-09-13 ENCOUNTER — Inpatient Hospital Stay (HOSPITAL_COMMUNITY): Payer: PPO

## 2020-09-13 ENCOUNTER — Other Ambulatory Visit: Payer: Self-pay

## 2020-09-13 DIAGNOSIS — C169 Malignant neoplasm of stomach, unspecified: Principal | ICD-10-CM

## 2020-09-13 DIAGNOSIS — K5669 Other partial intestinal obstruction: Secondary | ICD-10-CM | POA: Diagnosis not present

## 2020-09-13 DIAGNOSIS — K760 Fatty (change of) liver, not elsewhere classified: Secondary | ICD-10-CM

## 2020-09-13 DIAGNOSIS — D649 Anemia, unspecified: Secondary | ICD-10-CM

## 2020-09-13 DIAGNOSIS — E785 Hyperlipidemia, unspecified: Secondary | ICD-10-CM

## 2020-09-13 DIAGNOSIS — I10 Essential (primary) hypertension: Secondary | ICD-10-CM

## 2020-09-13 DIAGNOSIS — J449 Chronic obstructive pulmonary disease, unspecified: Secondary | ICD-10-CM | POA: Diagnosis not present

## 2020-09-13 DIAGNOSIS — E441 Mild protein-calorie malnutrition: Secondary | ICD-10-CM

## 2020-09-13 LAB — BASIC METABOLIC PANEL
Anion gap: 11 (ref 5–15)
BUN: 9 mg/dL (ref 8–23)
CO2: 21 mmol/L — ABNORMAL LOW (ref 22–32)
Calcium: 9.4 mg/dL (ref 8.9–10.3)
Chloride: 108 mmol/L (ref 98–111)
Creatinine, Ser: 0.95 mg/dL (ref 0.61–1.24)
GFR, Estimated: >60 mL/min (ref >60)
Glucose, Bld: 105 mg/dL — ABNORMAL HIGH (ref 70–99)
Potassium: 4 mmol/L (ref 3.5–5.1)
Sodium: 140 mmol/L (ref 135–145)

## 2020-09-13 LAB — CBC
HCT: 32.9 % — ABNORMAL LOW (ref 39.0–52.0)
Hemoglobin: 11.8 g/dL — ABNORMAL LOW (ref 13.0–17.0)
MCH: 33.1 pg (ref 26.0–34.0)
MCHC: 35.9 g/dL (ref 30.0–36.0)
MCV: 92.4 fL (ref 80.0–100.0)
Platelets: 355 10*3/uL (ref 150–400)
RBC: 3.56 MIL/uL — ABNORMAL LOW (ref 4.22–5.81)
RDW: 15 % (ref 11.5–15.5)
WBC: 13.5 10*3/uL — ABNORMAL HIGH (ref 4.0–10.5)
nRBC: 0 % (ref 0.0–0.2)

## 2020-09-13 LAB — HEMOGLOBIN A1C
Hgb A1c MFr Bld: 5.6 % (ref 4.8–5.6)
Mean Plasma Glucose: 114.02 mg/dL

## 2020-09-13 LAB — GLUCOSE, CAPILLARY
Glucose-Capillary: 114 mg/dL — ABNORMAL HIGH (ref 70–99)
Glucose-Capillary: 96 mg/dL (ref 70–99)
Glucose-Capillary: 129 mg/dL — ABNORMAL HIGH (ref 70–99)
Glucose-Capillary: 141 mg/dL — ABNORMAL HIGH (ref 70–99)

## 2020-09-13 LAB — HIV ANTIBODY (ROUTINE TESTING W REFLEX): HIV Screen 4th Generation wRfx: NONREACTIVE

## 2020-09-13 LAB — PROTIME-INR
INR: 1 (ref 0.8–1.2)
Prothrombin Time: 13.4 seconds (ref 11.4–15.2)

## 2020-09-13 LAB — TYPE AND SCREEN
ABO/RH(D): O POS
Antibody Screen: NEGATIVE

## 2020-09-13 LAB — PHOSPHORUS: Phosphorus: 3.3 mg/dL (ref 2.5–4.6)

## 2020-09-13 LAB — SARS CORONAVIRUS 2 (TAT 6-24 HRS): SARS Coronavirus 2: NEGATIVE

## 2020-09-13 LAB — PREALBUMIN: Prealbumin: 7.2 mg/dL — ABNORMAL LOW (ref 18–38)

## 2020-09-13 LAB — ABO/RH: ABO/RH(D): O POS

## 2020-09-13 MED ORDER — LACTATED RINGERS IV SOLN: INTRAVENOUS | Status: DC

## 2020-09-13 MED ORDER — INSULIN ASPART 100 UNIT/ML IJ SOLN
0.0000 [IU] | Freq: Four times a day (QID) | INTRAMUSCULAR | Status: DC
  Administered 2020-09-13 – 2020-09-15 (×7): 1 [IU] via SUBCUTANEOUS
  Administered 2020-09-15: 2 [IU] via SUBCUTANEOUS
  Administered 2020-09-16 – 2020-09-17 (×4): 1 [IU] via SUBCUTANEOUS

## 2020-09-13 MED ORDER — TRAVASOL 10 % IV SOLN
INTRAVENOUS | Status: AC
  Filled 2020-09-13: qty 449.4

## 2020-09-13 MED ORDER — CHLORHEXIDINE GLUCONATE CLOTH 2 % EX PADS
6.0000 | MEDICATED_PAD | Freq: Every day | CUTANEOUS | Status: DC
  Administered 2020-09-13 – 2020-10-02 (×20): 6 via TOPICAL

## 2020-09-13 MED ORDER — POTASSIUM CHLORIDE 2 MEQ/ML IV SOLN
INTRAVENOUS | Status: AC
  Filled 2020-09-13 (×2): qty 1000

## 2020-09-13 MED ORDER — CLONIDINE HCL 0.2 MG/24HR TD PTWK
0.2000 mg | MEDICATED_PATCH | TRANSDERMAL | Status: DC
  Administered 2020-09-13: 0.2 mg via TRANSDERMAL
  Filled 2020-09-13: qty 1

## 2020-09-13 MED ORDER — SODIUM CHLORIDE 0.9% FLUSH
10.0000 mL | Freq: Two times a day (BID) | INTRAVENOUS | Status: DC
  Administered 2020-09-13 (×2): 10 mL

## 2020-09-13 MED ORDER — SODIUM CHLORIDE 0.9% FLUSH
10.0000 mL | INTRAVENOUS | Status: DC | PRN
  Administered 2020-09-14 – 2020-10-01 (×4): 10 mL

## 2020-09-13 MED ORDER — HEPARIN SODIUM (PORCINE) 5000 UNIT/ML IJ SOLN
5000.0000 [IU] | Freq: Three times a day (TID) | INTRAMUSCULAR | Status: DC
  Administered 2020-09-13 – 2020-09-23 (×24): 5000 [IU] via SUBCUTANEOUS
  Filled 2020-09-13 (×25): qty 1

## 2020-09-13 MED ORDER — TRACE MINERALS CU-MN-SE-ZN 300-55-60-3000 MCG/ML IV SOLN
INTRAVENOUS | Status: DC
  Filled 2020-09-13: qty 898.8

## 2020-09-13 NOTE — Progress Notes (Signed)
PT Cancellation Note  Patient Details Name: Alexander [name] MRN: [id number] DOB: 11-Sep-1955   Cancelled Treatment:    Reason Eval/Treat Not Completed: PT screened, no needs identified, will sign off.  Pt currently has no acute PT needs, but relays to me he is going to have surgery on Monday 09/16/20.  I anticipate PT/OT would be beneficial post-op, so will hold evaluation until after his surgery on Monday 09/16/20 scheduled for 07:15 am.  See OT evaluation for current mobility details.   Thanks,  Verdene Lennert, PT, DPT  Acute Rehabilitation [phone] pager #[phone] office       Barbarann Ehlers Alannie Amodio 09/13/2020, 12:24 PM

## 2020-09-13 NOTE — Evaluation (Signed)
Occupational Therapy Evaluation Patient Details Name: Alexander [name] MRN: [id number] DOB: June 20, 1955 Today's Date: 09/13/2020    History of Present Illness Mr. Garske is a 65 yo male with a history of HTN and GERD who developed nausea and early satiety about a month ago. His symptoms progressed until he was able to tolerate only a few bites of food per day. He also began having large volume emesis, as well as dark tarry stools. He was admitted at [facility] on 4/21, and a CT scan showed significant gastric distension, consistent with gastric outlet obstruction. He was mildly anemic but did not require any blood transfusions. An NG tube was placed. He underwent an EGD on 4/22, which showed a 1.5cm ulcer in the antrum with obstruction of the pylorus. The pyloric channel could only be traversed after dilations. A biopsy of the ulcer was performed, and yesterday the pathology came back as moderately-differentiated adenocarcinoma   Clinical Impression   Pt in recliner upon arrival with wife present. Pt moving well and is Ind with mobility, ambulating to bathroom, transfers and all selfcare; no physical assist required. Pt with no LOB observed during mobility. All education completed and no further acute OT services are indicated at this time. OT will sign off    Follow Up Recommendations  No OT follow up    Equipment Recommendations  None recommended by OT    Recommendations for Other Services       Precautions / Restrictions Precautions Precautions: None Precaution Comments: NG tube Restrictions Weight Bearing Restrictions: No      Mobility Bed Mobility Overal bed mobility: Independent                  Transfers Overall transfer level: Independent                    Balance Overall balance assessment: No apparent balance deficits (not formally assessed)                                         ADL either performed or assessed with  clinical judgement   ADL Overall ADL's : Modified independent;Independent                                             Vision Patient Visual Report: No change from baseline       Perception     Praxis      Pertinent Vitals/Pain Pain Assessment: No/denies pain     Hand Dominance Right   Extremity/Trunk Assessment Upper Extremity Assessment Upper Extremity Assessment: Overall WFL for tasks assessed   Lower Extremity Assessment Lower Extremity Assessment: Defer to PT evaluation   Cervical / Trunk Assessment Cervical / Trunk Assessment: Normal   Communication Communication Communication: No difficulties   Cognition Arousal/Alertness: Awake/alert Behavior During Therapy: WFL for tasks assessed/performed Overall Cognitive Status: Within Functional Limits for tasks assessed                                     General Comments       Exercises     Shoulder Instructions      Home Living Family/patient expects to be discharged to:: Private  residence Living Arrangements: Spouse/significant other Available Help at Discharge: Family;Available 24 hours/day Type of Home: House Home Access: Stairs to enter [employer] of Steps: 2   Home Layout: One level     Bathroom Shower/Tub: Tub/shower unit;Walk-in shower   Bathroom Toilet: Standard     Home Equipment: None          Prior Functioning/Environment Level of Independence: Independent                 OT Problem List: Decreased activity tolerance      OT Treatment/Interventions: Self-care/ADL training;Therapeutic activities;Patient/family education    OT Goals(Current goals can be found in the care plan section) Acute Rehab OT Goals Patient Stated Goal: go home OT Goal Formulation: With patient  OT Frequency:     Barriers to D/C:            Co-evaluation              AM-PAC OT "6 Clicks" Daily Activity     Outcome Measure Help from another  person eating meals?: None Help from another person taking care of personal grooming?: None Help from another person toileting, which includes using toliet, bedpan, or urinal?: None Help from another person bathing (including washing, rinsing, drying)?: None Help from another person to put on and taking off regular upper body clothing?: None Help from another person to put on and taking off regular lower body clothing?: None 6 Click Score: 24   End of Session Nurse Communication: Mobility status  Activity Tolerance: Patient tolerated treatment well Patient left: in chair;with call bell/phone within reach;with family/visitor present                   Time: 4010-2725 OT Time Calculation (min): 20 min Charges:  OT General Charges $OT Visit: 1 Visit OT Evaluation $OT Eval Low Complexity: 1 Low  Britt Bottom 09/13/2020, 12:32 PM

## 2020-09-13 NOTE — Consult Note (Signed)
Alexander [name] 03-29-1956  [id number].    Chief Complaint/Reason for Consult: gastric cancer, gastric outlet obstruction  HPI:  Alexander [name] is a 65 yo male with a history of HTN and GERD who developed nausea and early satiety about a month ago. His symptoms progressed until he was able to tolerate only a few bites of food per day. He also began having large volume emesis, as well as dark tarry stools. He was admitted at [facility] on 4/21, and a CT scan showed significant gastric distension, consistent with gastric outlet obstruction. He was mildly anemic but did not require any blood transfusions. An NG tube was placed. He underwent an EGD on 4/22, which showed a 1.5cm ulcer in the antrum with obstruction of the pylorus. The pyloric channel could only be traversed after dilations. A biopsy of the ulcer was performed, and yesterday the pathology came back as moderately-differentiated adenocarcinoma.   Attempts were made to advance the patient's diet after he underwent pyloric dilation, but he continued to have PO intolerance. He was transferred to [facility] last night. He has not eaten in over a week but denies recent unintentional weight loss.  His only prior abdominal surgeries are an appendectomy and inguinal hernia repair, both in childhood. He had a cardiac workup in July 2021; Lexiscan showed no ischemic changes with an EF of 70%.  ROS: Review of Systems  Constitutional: Negative for chills and fever.  HENT: Negative for congestion.   Respiratory: Negative for shortness of breath and stridor.   Cardiovascular: Negative for chest pain.  Gastrointestinal: Positive for blood in stool, nausea and vomiting. Negative for abdominal pain.  Musculoskeletal: Negative for falls.  Skin: Negative for rash.  Neurological: Negative for dizziness and seizures.    Family History  Problem Relation Age of Onset  . Diabetes Sister   . Hypertension Brother   . Brain cancer Father   .  Cancer Neg Hx   . Stroke Neg Hx   . Heart attack Neg Hx     Past Medical History:  Diagnosis Date  . COPD (chronic obstructive pulmonary disease) ([place])   . GERD without esophagitis 01/04/2020   [facility]    Past Surgical History:  Procedure Laterality Date  . APPENDECTOMY    . HERNIA REPAIR     age 75 or 2     Social History:  reports that he quit smoking about 16 months ago. His smoking use included cigars. He has never used smokeless tobacco. He reports previous alcohol use. He reports previous drug use.  Allergies: No Known Allergies  Medications Prior to Admission  Medication Sig Dispense Refill  . losartan (COZAAR) 25 MG tablet Take 25 mg by mouth every morning.    . pantoprazole (PROTONIX) 40 MG tablet Take 40 mg by mouth at bedtime.    . rosuvastatin (CRESTOR) 10 MG tablet Take 10 mg by mouth every morning.    . Sod Picosulfate-Mag Ox-Cit Acd (CLENPIQ) 10-3.5-12 MG-GM -GM/160ML SOLN Take 1 kit by mouth as directed. 320 mL 0     Physical Exam: Blood pressure (!) 173/94, pulse 81, temperature 98.8 F (37.1 C), temperature source Oral, resp. rate 18, SpO2 98 %. General: resting comfortably, appears stated age, no apparent distress Neurological: alert and oriented, no focal deficits, cranial nerves grossly in tact HEENT: normocephalic, atraumatic, oropharynx clear, no scleral icterus. NG tube in place draining clear gastric contents. CV:  extremities warm and well-perfused Respiratory: normal work of  breathing, lungs clear to auscultation bilaterally, symmetric chest wall expansion Abdomen: soft, nondistended, nontender to deep palpation. No masses or organomegaly. Extremities: warm and well-perfused, no deformities, moving all extremities spontaneously Psychiatric: normal mood and affect Skin: warm and dry, no jaundice, no rashes or lesions   Results for orders placed or performed during the hospital encounter of 09/12/20 (from the past 48 hour(s))   CBC WITH DIFFERENTIAL     Status: Abnormal   Collection Time: 09/12/20  9:35 PM  Result Value Ref Range   WBC 13.2 (H) 4.0 - 10.5 K/uL   RBC 3.75 (L) 4.22 - 5.81 MIL/uL   Hemoglobin 12.0 (L) 13.0 - 17.0 g/dL   HCT 35.4 (L) 39.0 - 52.0 %   MCV 94.4 80.0 - 100.0 fL   MCH 32.0 26.0 - 34.0 pg   MCHC 33.9 30.0 - 36.0 g/dL    Comment: CORRECTED FOR COLD AGGLUTININS   RDW 14.9 11.5 - 15.5 %   Platelets 360 150 - 400 K/uL   nRBC 0.0 0.0 - 0.2 %   Neutrophils Relative % 72 %   Neutro Abs 9.6 (H) 1.7 - 7.7 K/uL   Lymphocytes Relative 16 %   Lymphs Abs 2.1 0.7 - 4.0 K/uL   Monocytes Relative 9 %   Monocytes Absolute 1.2 (H) 0.1 - 1.0 K/uL   Eosinophils Relative 1 %   Eosinophils Absolute 0.2 0.0 - 0.5 K/uL   Basophils Relative 1 %   Basophils Absolute 0.1 0.0 - 0.1 K/uL   Immature Granulocytes 1 %   Abs Immature Granulocytes 0.11 (H) 0.00 - 0.07 K/uL    Comment: Performed at [facility], [address], [place], [place] [address]  Comprehensive metabolic panel     Status: Abnormal   Collection Time: 09/12/20  9:35 PM  Result Value Ref Range   Sodium 139 135 - 145 mmol/L   Potassium 3.8 3.5 - 5.1 mmol/L   Chloride 108 98 - 111 mmol/L   CO2 18 (L) 22 - 32 mmol/L   Glucose, Bld 107 (H) 70 - 99 mg/dL    Comment: Glucose reference range applies only to samples taken after fasting for at least 8 hours.   BUN 9 8 - 23 mg/dL   Creatinine, Ser 0.99 0.61 - 1.24 mg/dL   Calcium 9.1 8.9 - 10.3 mg/dL   Total Protein 6.6 6.5 - 8.1 g/dL   Albumin 3.4 (L) 3.5 - 5.0 g/dL   AST 28 15 - 41 U/L   ALT 33 0 - 44 U/L   Alkaline Phosphatase 68 38 - 126 U/L   Total Bilirubin 1.3 (H) 0.3 - 1.2 mg/dL   GFR, Estimated >60 >60 mL/min    Comment: (NOTE) Calculated using the CKD-EPI Creatinine Equation (2021)    Anion gap 13 5 - 15    Comment: Performed at [facility] [address], [place], [place] [address]  Magnesium     Status: None   Collection Time: 09/12/20  9:35 PM  Result Value  Ref Range   Magnesium 2.0 1.7 - 2.4 mg/dL    Comment: Performed at [facility] [address] [place], [place] [address]  Basic metabolic panel     Status: Abnormal   Collection Time: 09/13/20  2:59 AM  Result Value Ref Range   Sodium 140 135 - 145 mmol/L   Potassium 4.0 3.5 - 5.1 mmol/L   Chloride 108 98 - 111 mmol/L   CO2 21 (L)  22 - 32 mmol/L   Glucose, Bld 105 (H) 70 - 99 mg/dL    Comment: Glucose reference range applies only to samples taken after fasting for at least 8 hours.   BUN 9 8 - 23 mg/dL   Creatinine, Ser 0.95 0.61 - 1.24 mg/dL   Calcium 9.4 8.9 - 10.3 mg/dL   GFR, Estimated >60 >60 mL/min    Comment: (NOTE) Calculated using the CKD-EPI Creatinine Equation (2021)    Anion gap 11 5 - 15    Comment: Performed at [facility] [address] [place], [place] [address]  CBC     Status: Abnormal   Collection Time: 09/13/20  2:59 AM  Result Value Ref Range   WBC 13.5 (H) 4.0 - 10.5 K/uL   RBC 3.56 (L) 4.22 - 5.81 MIL/uL   Hemoglobin 11.8 (L) 13.0 - 17.0 g/dL   HCT 32.9 (L) 39.0 - 52.0 %   MCV 92.4 80.0 - 100.0 fL   MCH 33.1 26.0 - 34.0 pg   MCHC 35.9 30.0 - 36.0 g/dL    Comment: CORRECTED FOR COLD AGGLUTININS   RDW 15.0 11.5 - 15.5 %   Platelets 355 150 - 400 K/uL   nRBC 0.0 0.0 - 0.2 %    Comment: Performed at [facility], [place] [address] [place], [place] [address]   DG Abd Portable 1V  Result Date: 09/12/2020 CLINICAL DATA:  NG tube in place.  Gastric outlet obstruction. EXAM: PORTABLE ABDOMEN - 1 VIEW COMPARISON:  Abdominal CTA 09/05/2020 FINDINGS: Tip and side port of the enteric tube are below the diaphragm in the stomach, enteric tube is coiled within the gastric body. Decreased gastric distension from prior CT. Generalized paucity of small bowel gas. Residual high-density contrast throughout the colon. Vascular calcifications are seen. IMPRESSION: Tip and side port of the enteric tube below the diaphragm in the stomach, enteric tube coiled  within the gastric body. Electronically Signed   By: Keith Rake M.D.   On: 09/12/2020 23:28   [place] EKG SITE RITE  Result Date: 09/13/2020 If Site Rite image not attached, placement could not be confirmed due to current cardiac rhythm.     Assessment/Plan 65 yo male with gastric outlet obstruction secondary to a gastric cancer at the antrum. I reviewed the patient's EGD reports and his CT chest/abd/pelvis from [facility]. The stomach is markedly dilated with thickening of the pyloric channel, however the small bowel is decompressed. The liver is steatotic but there are no signs of metastatic disease. Since the patient is obstructed he should undergo upfront surgical resection for treatment of his cancer, which will include an open distal gastrectomy with lymphadenectomy. Final pathology results will determine whether adjuvant chemotherapy is indicated. - Place PICC today and begin TPN to optimize nutrition, as patient has been NPO for >1 week - Continue NG decompression. Ok for ice and sips of clears for comfort. Do not administer PO meds as they will not be absorbed. - Plan for surgery early next week, likely Monday 4/2 - Surgery will continue to follow   Michaelle Birks, [facility], Hepatobiliary and Pancreatic Surgery 09/13/20 6:30 AM

## 2020-09-13 NOTE — Progress Notes (Signed)
Peripherally Inserted Central Catheter Placement  The IV Nurse has discussed with the patient and/or persons authorized to consent for the patient, the purpose of this procedure and the potential benefits and risks involved with this procedure.  The benefits include less needle sticks, lab draws from the catheter, and the patient may be discharged home with the catheter. Risks include, but not limited to, infection, bleeding, blood clot (thrombus formation), and puncture of an artery; nerve damage and irregular heartbeat and possibility to perform a PICC exchange if needed/ordered by physician.  Alternatives to this procedure were also discussed.  Bard Power PICC patient education guide, fact sheet on infection prevention and patient information card has been provided to patient /or left at bedside.    PICC Placement Documentation  PICC Double Lumen 09/13/20 PICC Right Brachial 36 cm 0 cm (Active)  Indication for Insertion or Continuance of Line Administration of hyperosmolar/irritating solutions (i.e. TPN, Vancomycin, etc.) 09/13/20 1000  Exposed Catheter (cm) 0 cm 09/13/20 1000  Site Assessment Clean;Dry;Intact 09/13/20 1000  Lumen #1 Status Flushed;Saline locked;Blood return noted 09/13/20 1000  Lumen #2 Status Flushed;Saline locked;Blood return noted 09/13/20 1000  Dressing Type Transparent;Securing device 09/13/20 1000  Dressing Status Clean;Dry;Intact 09/13/20 1000  Antimicrobial disc in place? Yes 09/13/20 1000  Safety Lock Not Applicable 93/81/82 9937  Line Care Connections checked and tightened 09/13/20 1000  Dressing Intervention New dressing 09/13/20 1000  Dressing Change Due 09/20/20 09/13/20 [place], [place] 09/13/2020, 10:31 AM

## 2020-09-13 NOTE — Progress Notes (Signed)
Initial Nutrition Assessment  DOCUMENTATION CODES:   Obesity unspecified  INTERVENTION:   -TPN management per pharmacy  NUTRITION DIAGNOSIS:   Increased nutrient needs related to chronic illness (gastric cancer) as evidenced by estimated needs.  GOAL:   Patient will meet greater than or equal to 90% of their needs  MONITOR:   Diet advancement,Labs,Weight trends,Skin,I & O's  REASON FOR ASSESSMENT:   Consult New TPN/TNA  ASSESSMENT:   Alexander [name] is a 65 y.o. male with medical history significant for COPD, HTN, HLD, GERD who was admitted from [facility] for management of newly diagnosed gastric malignancy with gastric outlet obstruction, he had work-up at [facility] which included EGD and biopsy showing gastric adenocarcinoma, NG tube was placed and he was transferred to [facility] for further treatment.  Pt admitted with GOO secondary to gastric cancer.   4/22- NGT placed, connected to low, intermittent suction 4/29- PICC placed, TPN initiated  Reviewed I/O's: -450 ml x 24 hours  Per general surgery notes, plan for surgery early next week.   Case discussed with RN, who reports PICC line was just placed and plan to start TPN today.   Spoke with pt and wife at bedside. Pt reports that he has a very healthy appetite at baseline, consuming 3 meals per day ("I would eat anything and everything"). Over the past 3 weeks, intake has decreased and pt was consuming mostly gingerale, activia yogurt, and smoothies, as that was all he would be able to keep down. Pt wife shares that pt experienced significant abdominal distention, which also impaired his ability to consume adequate nutrition ("his belly looked like he was 9 months pregnant with triplets"). Pt shares that his abdominal distention has reduced significantly and suspects that this has caused him to lose weight. Of note, pt was transferred from [facility], where he had been NPO for approximately 1  week.   Reviewed wt hx; pt has experienced a 5.2% wt loss over the past 3 weeks, which is significant for time frame. Pt denies any weight loss PTA.   Discussed rationale for NPO status and explained how pt would receive nutrition via TPN.   Per pharmacy note, plan to initiate TPN at 1800 at 35 ml/hr, which provides 825 kcals and 45 grams protein, meeting 50% of estimated kcal and protein needs.   Medications reviewed and include lactated ringers with potassium chloride infusion @ 75 ml/hr.   Lab Results  Component Value Date   HGBA1C 5.6 09/13/2020   PTA DM medications are none.   Labs reviewed: CBGS: 114 (inpatient orders for glycemic control are 0-9 units insulin aspart TID with meals).   NUTRITION - FOCUSED PHYSICAL EXAM:  Flowsheet Row Most Recent Value  Orbital Region No depletion  Upper Arm Region No depletion  Thoracic and Lumbar Region No depletion  Buccal Region No depletion  Temple Region No depletion  Clavicle Bone Region No depletion  Clavicle and Acromion Bone Region No depletion  Scapular Bone Region No depletion  Dorsal Hand No depletion  Patellar Region Mild depletion  Anterior Thigh Region Mild depletion  Posterior Calf Region Mild depletion  Edema (RD Assessment) None  Hair Reviewed  Eyes Reviewed  Mouth Reviewed  Skin Reviewed  Nails Reviewed       Diet Order:   Diet Order            Diet NPO time specified  Diet effective now  EDUCATION NEEDS:   Education needs have been addressed  Skin:  Skin Assessment: Reviewed RN Assessment  Last BM:  09/05/20  Height:   Ht Readings from Last 1 Encounters:  09/13/20 5\' 2"  (1.575 m)    Weight:   Wt Readings from Last 1 Encounters:  09/13/20 77.2 kg    Ideal Body Weight:  53.6 kg  BMI:  Body mass index is 31.13 kg/m.  Estimated Nutritional Needs:   Kcal:  1650-1850  Protein:  90-105 grams  Fluid:  > 1.6 L    Loistine Chance, RD, LDN, [place] Registered Dietitian  II Certified Diabetes Care and Education Specialist Please refer to [facility] for RD and/or RD on-call/weekend/after hours pager

## 2020-09-13 NOTE — Plan of Care (Signed)
  Problem: Education: Goal: Knowledge of General Education information will improve Description: Including pain rating scale, medication(s)/side effects and non-pharmacologic comfort measures Outcome: Progressing   Problem: Clinical Measurements: Goal: Ability to maintain clinical measurements within normal limits will improve Outcome: Progressing Goal: Will remain free from infection Outcome: Progressing Goal: Diagnostic test results will improve Outcome: Progressing Goal: Respiratory complications will improve Outcome: Progressing Goal: Cardiovascular complication will be avoided Outcome: Progressing   Problem: Health Behavior/Discharge Planning: Goal: Ability to manage health-related needs will improve Outcome: Progressing   Problem: Activity: Goal: Risk for activity intolerance will decrease Outcome: Progressing   Problem: Nutrition: Goal: Adequate nutrition will be maintained Outcome: Progressing   Problem: Coping: Goal: Level of anxiety will decrease Outcome: Progressing   Problem: Elimination: Goal: Will not experience complications related to bowel motility Outcome: Progressing Goal: Will not experience complications related to urinary retention Outcome: Progressing

## 2020-09-13 NOTE — Consult Note (Addendum)
[place]  [phone] [phone]   MEDICAL ONCOLOGY - INITIAL CONSULTATION  Referral MD: Dr. Lala Lund  Reason for Referral: Gastric adenocarcinoma  HPI: [name] is a 65 year old male with a past medical history significant for COPD, hypertension, hyperlipidemia, GERD.  He was admitted from [facility] for management of newly diagnosed gastric malignancy with gastric outlet obstruction.  Outside records from [facility] have been reviewed. The patient was admitted to [facility] starting on 4/21 for abdominal bloating with 2 episodes of large-volume emesis and melena.  CTA chest/abdomen/pelvis showed a nodular thickening in the distal gastric mucosa involving the antrum and pylorus concerning for infiltrative neoplasm with associated gastric outlet obstruction.  He was also found to have severe fatty infiltrate of his liver.  He was initially treated with IV PPI and IV octreotide.  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  Biopsy was performed.  Biopsy was consistent with moderately differentiated adenocarcinoma and he was transferred to [facility] for surgical evaluation.  The patient has been seen by general surgery who have placed him on TPN to optimize his nutrition and he has an NG tube in place for decompression.  Plan is for surgery early next week.  The patient is sitting up in the recliner today.  His wife is at the bedside.  He reports that he has not had a full meal since before Easter.  He has lost about 15 pounds.  He currently has an NG tube in place with improvement of his abdominal/epigastric pain and bloating.  He reports prior to admission he had some hematemesis.  He has not had a bowel movement in about a week but stools were black in color with his last bowel movement.  He  denies fevers, chills, headaches, dizziness, chest pain, shortness of breath, cough.  No lower extremity edema reported.  The patient is married and has 2 biologic children.  He has a history of smoking cigarettes including 1 pack a day for 40 years.  He quit 2020.  He previously drank a sixpack of beer daily but quit in 2021.  Family history significant for a father with brain cancer and a maternal uncle with acute leukemia.  Medical oncology was asked to see the patient to make recommendations regarding his gastric adenocarcinoma.   Past Medical History:  Diagnosis Date  . COPD (chronic obstructive pulmonary disease) ([place])   . GERD without esophagitis 01/04/2020   [facility]  :  Past Surgical History:  Procedure Laterality Date  . APPENDECTOMY    . HERNIA REPAIR     age 83 or 2   :  Current Facility-Administered Medications  Medication Dose Route Frequency Provider Last Rate Last Admin  . acetaminophen (TYLENOL) tablet 650 mg  650 mg Oral Q6H PRN Lenore Cordia, MD       Or  . acetaminophen (TYLENOL) suppository 650 mg  650 mg Rectal Q6H PRN Lenore Cordia, MD      . Chlorhexidine Gluconate Cloth 2 % PADS 6 each  6 each Topical Daily Thurnell Lose, MD   6 each at 09/13/20 1100  . cloNIDine (CATAPRES - Dosed in mg/24 hr) patch 0.2 mg  0.2 mg Transdermal Q Fri Singh, Prashant K, MD   0.2 mg at 09/13/20 1115  . heparin injection 5,000 Units  5,000 Units  Subcutaneous Q8H Thurnell Lose, MD   5,000 Units at 09/13/20 1311  . insulin aspart (novoLOG) injection 0-9 Units  0-9 Units Subcutaneous Q6H Wilson Singer I, RPH      . labetalol (NORMODYNE) injection 10 mg  10 mg Intravenous Q4H PRN Lenore Cordia, MD   10 mg at 09/13/20 0924  . lactated ringers 1,000 mL with potassium chloride 20 mEq infusion   Intravenous Continuous Wilson Singer I, RPH 75 mL/hr at 09/13/20 1055 New Bag at 09/13/20 1055  . ondansetron (ZOFRAN) injection 4 mg  4 mg Intravenous Q6H  PRN Zada Finders R, MD      . pantoprazole (PROTONIX) injection 40 mg  40 mg Intravenous Q12H Lenore Cordia, MD   40 mg at 09/13/20 0919  . sodium chloride flush (NS) 0.9 % injection 10-40 mL  10-40 mL Intracatheter Q12H Thurnell Lose, MD   10 mL at 09/13/20 1059  . sodium chloride flush (NS) 0.9 % injection 10-40 mL  10-40 mL Intracatheter PRN Thurnell Lose, MD      . TPN ADULT (ION)   Intravenous Continuous TPN Wilson Singer I, RPH         No Known Allergies:  Family History  Problem Relation Age of Onset  . Diabetes Sister   . Hypertension Brother   . Brain cancer Father   . Cancer Neg Hx   . Stroke Neg Hx   . Heart attack Neg Hx   :  Social History   Socioeconomic History  . Marital status: Married    Spouse name: Not on file  . Number of children: Not on file  . Years of education: Not on file  . Highest education level: Not on file  Occupational History  . Not on file  Tobacco Use  . Smoking status: Former Smoker    Types: Cigars    Quit date: 04/2019    Years since quitting: 1.4  . Smokeless tobacco: Never Used  . Tobacco comment: 35-40 pack years  Substance and Sexual Activity  . Alcohol use: Not Currently    Comment: doesnt drink anymore. stopped in 2021.  Previously drinking 6 pack of beer on almost daily basis.  . Drug use: Not Currently  . Sexual activity: Not on file  Other Topics Concern  . Not on file  Social History Narrative  . Not on file   Social Determinants of Health   Financial Resource Strain: Not on file  Food Insecurity: Not on file  Transportation Needs: Not on file  Physical Activity: Not on file  Stress: Not on file  Social Connections: Not on file  Intimate Partner Violence: Not on file  :  Review of Systems: A comprehensive 14 point review of systems was negative except as noted in the HPI.  Exam: Patient Vitals for the past 24 hrs:  BP Temp Temp src Pulse Resp SpO2 Height Weight  09/13/20 1231 (!) 169/100  97.8 F (36.6 C) Oral 75 17 96 % -- --  09/13/20 0900 -- -- -- -- -- -- 5\' 2"  (1.575 m) 77.2 kg  09/13/20 0811 -- -- -- -- -- -- -- 77.2 kg  09/13/20 0740 (!) 184/90 97.8 F (36.6 C) Oral 76 20 96 % -- --  09/13/20 0349 (!) 173/94 98.8 F (37.1 C) Oral 81 18 98 % -- --  09/12/20 2300 (!) 154/80 98.8 F (37.1 C) Oral 80 18 99 % -- --  09/12/20 2056 (!) 174/94 97.6 F (36.4  C) Oral 81 18 98 % -- --    General:  well-nourished in no acute distress.   Eyes:  no scleral icterus.   ENT: NG tube in place, there were no oropharyngeal lesions.   Lymphatics:  Negative cervical, supraclavicular or axillary adenopathy.   Respiratory: lungs were clear bilaterally without wheezing or crackles.   Cardiovascular:  Regular rate and rhythm, S1/S2, without murmur, rub or gallop.  There was no pedal edema.   GI: Hypoactive bowel sounds, soft, nontender, nondistended. Musculoskeletal: Strength symmetrical in the upper and lower extremities. Skin exam was without echymosis, petichae.   Neuro exam was nonfocal. Patient was alert and oriented.  Attention was good. Language was appropriate.  Mood was normal without depression.  Speech was not pressured.  Thought content was not tangential.     Lab Results  Component Value Date   WBC 13.5 (H) 09/13/2020   HGB 11.8 (L) 09/13/2020   HCT 32.9 (L) 09/13/2020   PLT 355 09/13/2020   GLUCOSE 105 (H) 09/13/2020   ALT 33 09/12/2020   AST 28 09/12/2020   NA 140 09/13/2020   K 4.0 09/13/2020   CL 108 09/13/2020   CREATININE 0.95 09/13/2020   BUN 9 09/13/2020   CO2 21 (L) 09/13/2020    DG Chest Port 1 View  Result Date: 09/13/2020 CLINICAL DATA:  Cough and shortness of breath EXAM: PORTABLE CHEST 1 VIEW COMPARISON:  Chest CT September 05, 2020 FINDINGS: Nasogastric tube tip and side port in stomach. No edema or airspace opacity. Heart size and pulmonary vascularity are normal. No adenopathy. No bone lesions. IMPRESSION: Nasogastric tube tip and side port in  stomach. Lungs clear. Cardiac silhouette within normal limits. Electronically Signed   By: Lowella Grip III M.D.   On: 09/13/2020 08:00   DG Abd Portable 1V  Result Date: 09/12/2020 CLINICAL DATA:  NG tube in place.  Gastric outlet obstruction. EXAM: PORTABLE ABDOMEN - 1 VIEW COMPARISON:  Abdominal CTA 09/05/2020 FINDINGS: Tip and side port of the enteric tube are below the diaphragm in the stomach, enteric tube is coiled within the gastric body. Decreased gastric distension from prior CT. Generalized paucity of small bowel gas. Residual high-density contrast throughout the colon. Vascular calcifications are seen. IMPRESSION: Tip and side port of the enteric tube below the diaphragm in the stomach, enteric tube coiled within the gastric body. Electronically Signed   By: Keith Rake M.D.   On: 09/12/2020 23:28   [place] EKG SITE RITE  Result Date: 09/13/2020 If Site Rite image not attached, placement could not be confirmed due to current cardiac rhythm.    DG Chest Port 1 View  Result Date: 09/13/2020 CLINICAL DATA:  Cough and shortness of breath EXAM: PORTABLE CHEST 1 VIEW COMPARISON:  Chest CT September 05, 2020 FINDINGS: Nasogastric tube tip and side port in stomach. No edema or airspace opacity. Heart size and pulmonary vascularity are normal. No adenopathy. No bone lesions. IMPRESSION: Nasogastric tube tip and side port in stomach. Lungs clear. Cardiac silhouette within normal limits. Electronically Signed   By: Lowella Grip III M.D.   On: 09/13/2020 08:00   DG Abd Portable 1V  Result Date: 09/12/2020 CLINICAL DATA:  NG tube in place.  Gastric outlet obstruction. EXAM: PORTABLE ABDOMEN - 1 VIEW COMPARISON:  Abdominal CTA 09/05/2020 FINDINGS: Tip and side port of the enteric tube are below the diaphragm in the stomach, enteric tube is coiled within the gastric body. Decreased gastric distension from prior CT. Generalized paucity  of small bowel gas. Residual high-density contrast throughout  the colon. Vascular calcifications are seen. IMPRESSION: Tip and side port of the enteric tube below the diaphragm in the stomach, enteric tube coiled within the gastric body. Electronically Signed   By: Keith Rake M.D.   On: 09/12/2020 23:28   [place] EKG SITE RITE  Result Date: 09/13/2020 If Site Rite image not attached, placement could not be confirmed due to current cardiac rhythm.  Assessment and Plan:  1.  Moderately differentiated adenocarcinoma of the stomach 2.  Gastric outlet obstruction secondary to gastric cancer 3.  Mild anemia 4.  COPD 5.  Hypertension 6.  Hyperlipidemia 7.  Hepatic steatosis 8.  Protein calorie malnutrition  -Discussed the biopsy findings as well as the CTA chest/abdomen/pelvis performed at [facility].  CT scan does not appear to show any evidence of metastatic disease. -The patient will proceed with gastrectomy early next week.  We will await surgical pathology report. -Discussed with the patient that depending on the biopsy results, he may need adjuvant systemic therapy.  Further discussion regarding adjuvant therapy will occur once biopsy results are available to [place]. -The patient has mild anemia due to GI blood loss.  Recent iron studies performed 08/20/2020 did not show any evidence of iron deficiency.  Monitor hemoglobin closely and transfuse for hemoglobin less than 8. -TPN to begin this evening per general surgery.  Thank you for this referral.   Mikey Bussing, DNP, AGPCNP-BC, AOCNP  ADDENDUM: I saw and examined [name].  He is neurovascularly.  He was former Nature conservation officer.  He was in the [employer].  I thanked him for his service to our country.  Looks like this is a localized process.  He has adenocarcinoma.  It does not look like this is spread a least from the reports I have seen.  I totally agree that this is a surgical issue and he needs to have the tumor resected.  By doing this, we can get the stage of the tumor.  He clearly needs  nutrition right now.  He will start TNA this evening.  He will get this up until his surgery.  His prealbumin is only 7.2 which is quite low.  He will clearly need postop chemotherapy.  This is typically the standard now.  He lives down in [place].  It certainly would be easier for him to be treated down there since it is closer to him.  We will certainly make arrangements for all of this at the time of his discharge after surgery.  Again, hopefully this is no more than a stage II malignancy.  I cannot think of any other studies that need to be done right now.  We will follow along and really just always supportive care for him right now.  We really we will have to see what the results of surgery show.  I know that he is in fantastic hands with all the staff up on [address].  It was truly a pleasure to meet he and his wife.  I talked to them.  I answered their questions.  I would like to hope that he will be cured from his cancer by surgery and adjuvant therapy.  Lattie Haw, MD  Jeneen Rinks 1:5

## 2020-09-13 NOTE — Progress Notes (Signed)
Progress Note     Subjective: Denies abdominal pain. Understands plans for OR early next week.   Objective: Vital signs in last 24 hours: Temp:  [97.6 F (36.4 C)-98.8 F (37.1 C)] 97.8 F (36.6 C) (04/29 0740) Pulse Rate:  [76-81] 76 (04/29 0740) Resp:  [18-20] 20 (04/29 0740) BP: (154-184)/(80-94) 184/90 (04/29 0740) SpO2:  [96 %-99 %] 96 % (04/29 0740) Weight:  [77.2 kg] 77.2 kg (04/29 0900)    Intake/Output from previous day: 04/28 0701 - 04/29 0700 In: -  Out: 450 [Urine:450] Intake/Output this shift: No intake/output data recorded.  PE: General: pleasant, WD, overweight male who is laying in bed in NAD HEENT: head is normocephalic, atraumatic.  Sclera are noninjected. Ears and nose without any masses or lesions.  Mouth is pink and moist Heart: regular, rate, and rhythm.  Lungs:  Respiratory effort nonlabored Abd: soft, NT, ND, +BS, no masses, hernias, or organomegaly, NGT with minimal thin yellowish drainage  MS: all 4 extremities are symmetrical with no cyanosis, clubbing, or edema. Skin: warm and dry with no masses, lesions, or rashes Neuro: Cranial nerves 2-12 grossly intact, sensation is normal throughout Psych: A&Ox3 with an appropriate affect.    Lab Results:  Recent Labs    09/12/20 2135 09/13/20 0259  WBC 13.2* 13.5*  HGB 12.0* 11.8*  HCT 35.4* 32.9*  PLT 360 355   BMET Recent Labs    09/12/20 2135 09/13/20 0259  NA 139 140  K 3.8 4.0  CL 108 108  CO2 18* 21*  GLUCOSE 107* 105*  BUN 9 9  CREATININE 0.99 0.95  CALCIUM 9.1 9.4   PT/INR Recent Labs    09/13/20 0638  LABPROT 13.4  INR 1.0   CMP     Component Value Date/Time   NA 140 09/13/2020 0259   K 4.0 09/13/2020 0259   CL 108 09/13/2020 0259   CO2 21 (L) 09/13/2020 0259   GLUCOSE 105 (H) 09/13/2020 0259   BUN 9 09/13/2020 0259   CREATININE 0.95 09/13/2020 0259   CALCIUM 9.4 09/13/2020 0259   PROT 6.6 09/12/2020 2135   ALBUMIN 3.4 (L) 09/12/2020 2135   AST 28  09/12/2020 2135   ALT 33 09/12/2020 2135   ALKPHOS 68 09/12/2020 2135   BILITOT 1.3 (H) 09/12/2020 2135   GFRNONAA >60 09/13/2020 0259   Lipase  No results found for: LIPASE     Studies/Results: DG Chest Port 1 View  Result Date: 09/13/2020 CLINICAL DATA:  Cough and shortness of breath EXAM: PORTABLE CHEST 1 VIEW COMPARISON:  Chest CT September 05, 2020 FINDINGS: Nasogastric tube tip and side port in stomach. No edema or airspace opacity. Heart size and pulmonary vascularity are normal. No adenopathy. No bone lesions. IMPRESSION: Nasogastric tube tip and side port in stomach. Lungs clear. Cardiac silhouette within normal limits. Electronically Signed   By: Lowella Grip III M.D.   On: 09/13/2020 08:00   DG Abd Portable 1V  Result Date: 09/12/2020 CLINICAL DATA:  NG tube in place.  Gastric outlet obstruction. EXAM: PORTABLE ABDOMEN - 1 VIEW COMPARISON:  Abdominal CTA 09/05/2020 FINDINGS: Tip and side port of the enteric tube are below the diaphragm in the stomach, enteric tube is coiled within the gastric body. Decreased gastric distension from prior CT. Generalized paucity of small bowel gas. Residual high-density contrast throughout the colon. Vascular calcifications are seen. IMPRESSION: Tip and side port of the enteric tube below the diaphragm in the stomach, enteric tube coiled within the gastric  body. Electronically Signed   By: Keith Rake M.D.   On: 09/12/2020 23:28   [place] EKG SITE RITE  Result Date: 09/13/2020 If Site Rite image not attached, placement could not be confirmed due to current cardiac rhythm.   Anti-infectives: Anti-infectives (From admission, onward)   None       Assessment/Plan GOO secondary to gastric adenocarcinoma in antrum - PICC and TPN - NGT decompression - ok to have ice chips and sips for comfort - check nutritional labs Monday, likely OR Monday     LOS: 1 day    Norm Parcel, [facility] Surgery 09/13/2020, 9:11 AM Please  see Amion for pager number during day hours 7:00am-4:30pm

## 2020-09-13 NOTE — Progress Notes (Signed)
PROGRESS NOTE                                                                                                                                                                                                             Patient Demographics:    Alexander [name], is a 65 y.o. male, DOB - 07/31/1955, IRW:[id number]  Outpatient Primary MD for the patient is Serita Grammes, MD    LOS - 1  Admit date - 09/12/2020    No chief complaint on file.      Brief Narrative (HPI from H&P)  - Alexander [name] is a 65 y.o. male with medical history significant for COPD, HTN, HLD, GERD who was admitted from [facility] for management of newly diagnosed gastric malignancy with gastric outlet obstruction, he had work-up at [facility] which included EGD and biopsy showing gastric adenocarcinoma, NG tube was placed and he was transferred to [facility] for further treatment.   Subjective:    Alexander [name] today has, No headache, No chest pain, No abdominal pain - No Nausea, No new weakness tingling or numbness, no SOB   Assessment  & Plan :   1.  Gastric outlet obstruction caused by gastric adenocarcinoma as diagnosed with biopsy the hospital. General surgery following, will continue NG tube for decompression, per general surgery PICC line with ANA Comer to optimize him for possible surgical intervention on coming Monday or Tuesday.  2.  HTN.  Catapres patch along with IV labetalol as needed.  3.  Upper GI bleed caused by gastric ulcer at [facility].  Resolved after IV PPI.  Type screen and monitor.        Condition - Extremely Guarded  Family Communication  : Wife Neoma Laming [phone] 09/13/2020  Code Status :  Full  Consults  :  CCS, Oncology  PUD Prophylaxis : PPI   Procedures  :            Disposition Plan  :    Status is: Inpatient  Remains inpatient appropriate because:IV treatments appropriate  due to intensity of illness or inability to take PO   Dispo: The patient is from: Home              Anticipated d/c is to: Home  Patient currently is not medically stable to d/c.   Difficult to place patient No   DVT Prophylaxis  :    SCDs Start: 09/12/20 2127 Heparin    Lab Results  Component Value Date   PLT 355 09/13/2020    Diet :  Diet Order            Diet NPO time specified  Diet effective now                  Inpatient Medications  Scheduled Meds:  . cloNIDine  0.2 mg Transdermal Weekly  . insulin aspart  0-9 Units Subcutaneous Q6H  . pantoprazole (PROTONIX) IV  40 mg Intravenous Q12H   Continuous Infusions:  . lactated ringers with kcl 75 mL/hr at 09/13/20 0919  . lactated ringers    . TPN ADULT (ION)     PRN Meds:.acetaminophen **OR** acetaminophen, labetalol, ondansetron **OR** ondansetron (ZOFRAN) IV  Antibiotics  :    Anti-infectives (From admission, onward)   None       Time Spent in minutes  30   Lala Lund M.D on 09/13/2020 at 10:22 AM  To page go to [email or web address]   [facility] -  Office  [phone]    See all Orders from today for further details    Objective:   Vitals:   09/13/20 0349 09/13/20 0740 09/13/20 0811 09/13/20 0900  BP: (!) 173/94 (!) 184/90    Pulse: 81 76    Resp: 18 20    Temp: 98.8 F (37.1 C) 97.8 F (36.6 C)    TempSrc: Oral Oral    SpO2: 98% 96%    Weight:   77.2 kg 77.2 kg  Height:    5\' 2"  (1.575 m)    Wt Readings from Last 3 Encounters:  09/13/20 77.2 kg  08/20/20 81.4 kg     Intake/Output Summary (Last 24 hours) at 09/13/2020 1022 Last data filed at 09/13/2020 0400 Gross per 24 hour  Intake --  Output 450 ml  Net -450 ml     Physical Exam  Awake Alert, No new F.N deficits, NG in place [place].AT,PERRAL Supple Neck,No JVD, No cervical lymphadenopathy appriciated.  Symmetrical Chest wall movement, Good air movement bilaterally, CTAB RRR,No Gallops,Rubs or new  Murmurs, No Parasternal Heave +ve B.Sounds, Abd Soft, No tenderness, No organomegaly appriciated, No rebound - guarding or rigidity. No Cyanosis,      Data Review:    CBC Recent Labs  Lab 09/12/20 2135 09/13/20 0259  WBC 13.2* 13.5*  HGB 12.0* 11.8*  HCT 35.4* 32.9*  PLT 360 355  MCV 94.4 92.4  MCH 32.0 33.1  MCHC 33.9 35.9  RDW 14.9 15.0  LYMPHSABS 2.1  --   MONOABS 1.2*  --   EOSABS 0.2  --   BASOSABS 0.1  --     Recent Labs  Lab 09/12/20 2135 09/13/20 0259 09/13/20 0638  NA 139 140  --   K 3.8 4.0  --   CL 108 108  --   CO2 18* 21*  --   GLUCOSE 107* 105*  --   BUN 9 9  --   CREATININE 0.99 0.95  --   CALCIUM 9.1 9.4  --   AST 28  --   --   ALT 33  --   --   ALKPHOS 68  --   --   BILITOT 1.3*  --   --   ALBUMIN 3.4*  --   --   MG  2.0  --   --   INR  --   --  1.0    ------------------------------------------------------------------------------------------------------------------ No results for input(s): CHOL, HDL, LDLCALC, TRIG, CHOLHDL, LDLDIRECT in the last 72 hours.  No results found for: HGBA1C ------------------------------------------------------------------------------------------------------------------ No results for input(s): TSH, T4TOTAL, T3FREE, THYROIDAB in the last 72 hours.  Invalid input(s): FREET3  Cardiac Enzymes No results for input(s): CKMB, TROPONINI, MYOGLOBIN in the last 168 hours.  Invalid input(s): CK ------------------------------------------------------------------------------------------------------------------ No results found for: BNP  Micro Results No results found for this or any previous visit (from the past 240 hour(s)).  Radiology Reports DG Chest Port 1 View  Result Date: 09/13/2020 CLINICAL DATA:  Cough and shortness of breath EXAM: PORTABLE CHEST 1 VIEW COMPARISON:  Chest CT September 05, 2020 FINDINGS: Nasogastric tube tip and side port in stomach. No edema or airspace opacity. Heart size and pulmonary  vascularity are normal. No adenopathy. No bone lesions. IMPRESSION: Nasogastric tube tip and side port in stomach. Lungs clear. Cardiac silhouette within normal limits. Electronically Signed   By: Lowella Grip [name] M.D.   On: 09/13/2020 08:00   DG Abd Portable 1V  Result Date: 09/12/2020 CLINICAL DATA:  NG tube in place.  Gastric outlet obstruction. EXAM: PORTABLE ABDOMEN - 1 VIEW COMPARISON:  Abdominal CTA 09/05/2020 FINDINGS: Tip and side port of the enteric tube are below the diaphragm in the stomach, enteric tube is coiled within the gastric body. Decreased gastric distension from prior CT. Generalized paucity of small bowel gas. Residual high-density contrast throughout the colon. Vascular calcifications are seen. IMPRESSION: Tip and side port of the enteric tube below the diaphragm in the stomach, enteric tube coiled within the gastric body. Electronically Signed   By: Keith Rake M.D.   On: 09/12/2020 23:28   [place] EKG SITE RITE  Result Date: 09/13/2020 If Site Rite image not attached, placement could not be confirmed due to current cardiac rhythm.

## 2020-09-13 NOTE — Progress Notes (Addendum)
PHARMACY - TOTAL PARENTERAL NUTRITION CONSULT NOTE   Indication: gastric outlet obstruction + malnourishment   Patient Measurements: Height: 5\' 2"  (157.5 cm) Weight: 77.2 kg (170 lb 3.1 oz) IBW/kg (Calculated) : 54.6 TPN AdjBW (KG): 60.2 Body mass index is 31.13 kg/m. Usual Weight: ~75kg per patient   Assessment:  65 yo M with PMH significant for GERD and now newly diagnosed gastric malignancy with planned bowel resection on Monday 09/16/2020.Patient reports normal diet (3 meals/day) prior to admission with no dietary restrictions and no unusual weight changes or fluctuations recently. He stopped eating regularly a few days PTA.  Pharmacy consulted to manage TPN.  Glucose / Insulin: no DM known (no A1c on file), no SSI prior to TPN  Electrolytes: CO2 21, Chloride 108, Phos 3.3 other lytes WNL Renal: Scr <1, BUN WNL  Hepatic: LFTs WNL, Tbili 1.3, Alb 3.4, Prealbumin 7.2 Intake / Output; MIVF: LR @ 59ml/hr GI Imaging: 4/28 Abx Xray: enteric tube placed in stomach GI Surgeries / Procedures: planned for Monday 5/2 bowel resection   Central access: PICC 4/29  TPN start date: NPO + TPN to start 1800   Nutritional Goals  kCal: 1650, Protein: 190g, Fluid: 1.7L Goal TPN rate is 70 mL/hr (provides 90 g of protein, 1650 kcals per day) -current formula: 1649 kcal, 89g AA, 233g CHO, 49g lipids  Current Nutrition:  NPO + starting TPN   Plan:  Start TPN at half goal rate of 82mL/hr at 1800 Electrolytes in TPN: Na 26mEq/L, K 47mEq/L, Ca 83mEq/L, Mg 86mEq/L, and Phos 16mmol/L. Cl:Ac 1:2 Add standard MVI and trace elements to TPN Initiate Sensitive q6h SSI and adjust as needed  Reduce MIVF to 40 mL/hr at 1800 and remove K from MIVF Monitor TPN labs on Mon/Thurs TPN labs tomorrow AM   Wilson Singer, PharmD PGY1 Pharmacy Resident 09/13/2020 9:26 AM

## 2020-09-14 DIAGNOSIS — C169 Malignant neoplasm of stomach, unspecified: Secondary | ICD-10-CM | POA: Diagnosis not present

## 2020-09-14 LAB — BRAIN NATRIURETIC PEPTIDE: B Natriuretic Peptide: 86.4 pg/mL (ref 0.0–100.0)

## 2020-09-14 LAB — CBC WITH DIFFERENTIAL/PLATELET
Abs Immature Granulocytes: 0.16 10*3/uL — ABNORMAL HIGH (ref 0.00–0.07)
Basophils Absolute: 0.1 10*3/uL (ref 0.0–0.1)
Basophils Relative: 1 %
Eosinophils Absolute: 0.5 10*3/uL (ref 0.0–0.5)
Eosinophils Relative: 4 %
HCT: 33.1 % — ABNORMAL LOW (ref 39.0–52.0)
Hemoglobin: 11.3 g/dL — ABNORMAL LOW (ref 13.0–17.0)
Immature Granulocytes: 1 %
Lymphocytes Relative: 20 %
Lymphs Abs: 2.6 10*3/uL (ref 0.7–4.0)
MCH: 31.1 pg (ref 26.0–34.0)
MCHC: 34.1 g/dL (ref 30.0–36.0)
MCV: 91.2 fL (ref 80.0–100.0)
Monocytes Absolute: 0.9 10*3/uL (ref 0.1–1.0)
Monocytes Relative: 7 %
Neutro Abs: 9 10*3/uL — ABNORMAL HIGH (ref 1.7–7.7)
Neutrophils Relative %: 67 %
Platelets: 330 10*3/uL (ref 150–400)
RBC: 3.63 MIL/uL — ABNORMAL LOW (ref 4.22–5.81)
RDW: 14.5 % (ref 11.5–15.5)
WBC: 13.2 10*3/uL — ABNORMAL HIGH (ref 4.0–10.5)
nRBC: 0 % (ref 0.0–0.2)

## 2020-09-14 LAB — COMPREHENSIVE METABOLIC PANEL
ALT: 32 U/L (ref 0–44)
AST: 26 U/L (ref 15–41)
Albumin: 3.1 g/dL — ABNORMAL LOW (ref 3.5–5.0)
Alkaline Phosphatase: 62 U/L (ref 38–126)
Anion gap: 10 (ref 5–15)
BUN: 11 mg/dL (ref 8–23)
CO2: 27 mmol/L (ref 22–32)
Calcium: 8.8 mg/dL — ABNORMAL LOW (ref 8.9–10.3)
Chloride: 100 mmol/L (ref 98–111)
Creatinine, Ser: 0.79 mg/dL (ref 0.61–1.24)
GFR, Estimated: >60 mL/min (ref >60)
Glucose, Bld: 139 mg/dL — ABNORMAL HIGH (ref 70–99)
Potassium: 2.9 mmol/L — ABNORMAL LOW (ref 3.5–5.1)
Sodium: 137 mmol/L (ref 135–145)
Total Bilirubin: 0.8 mg/dL (ref 0.3–1.2)
Total Protein: 6.1 g/dL — ABNORMAL LOW (ref 6.5–8.1)

## 2020-09-14 LAB — MAGNESIUM: Magnesium: 2 mg/dL (ref 1.7–2.4)

## 2020-09-14 LAB — PHOSPHORUS: Phosphorus: 3.2 mg/dL (ref 2.5–4.6)

## 2020-09-14 LAB — PREALBUMIN: Prealbumin: 7.3 mg/dL — ABNORMAL LOW (ref 18–38)

## 2020-09-14 LAB — GLUCOSE, CAPILLARY
Glucose-Capillary: 128 mg/dL — ABNORMAL HIGH (ref 70–99)
Glucose-Capillary: 128 mg/dL — ABNORMAL HIGH (ref 70–99)
Glucose-Capillary: 137 mg/dL — ABNORMAL HIGH (ref 70–99)
Glucose-Capillary: 141 mg/dL — ABNORMAL HIGH (ref 70–99)

## 2020-09-14 LAB — TRIGLYCERIDES: Triglycerides: 90 mg/dL (ref <150)

## 2020-09-14 MED ORDER — TRAVASOL 10 % IV SOLN
INTRAVENOUS | Status: AC
  Filled 2020-09-14: qty 648

## 2020-09-14 MED ORDER — POTASSIUM CHLORIDE 10 MEQ/100ML IV SOLN
10.0000 meq | INTRAVENOUS | Status: AC
  Administered 2020-09-14 (×6): 10 meq via INTRAVENOUS
  Filled 2020-09-14 (×6): qty 100

## 2020-09-14 MED ORDER — HYDRALAZINE HCL 20 MG/ML IJ SOLN
10.0000 mg | Freq: Four times a day (QID) | INTRAMUSCULAR | Status: DC | PRN
  Administered 2020-09-14 – 2020-09-25 (×10): 10 mg via INTRAVENOUS
  Filled 2020-09-14 (×10): qty 1

## 2020-09-14 MED ORDER — PNEUMOCOCCAL VAC POLYVALENT 25 MCG/0.5ML IJ INJ
0.5000 mL | INJECTION | INTRAMUSCULAR | Status: AC
  Administered 2020-10-02: 0.5 mL via INTRAMUSCULAR
  Filled 2020-09-14: qty 0.5

## 2020-09-14 MED ORDER — CLONIDINE HCL 0.3 MG/24HR TD PTWK
0.3000 mg | MEDICATED_PATCH | TRANSDERMAL | Status: DC
  Administered 2020-09-20 – 2020-09-27 (×2): 0.3 mg via TRANSDERMAL
  Filled 2020-09-14 (×2): qty 1

## 2020-09-14 NOTE — Progress Notes (Signed)
PHARMACY - TOTAL PARENTERAL NUTRITION CONSULT NOTE  Indication: Gastric outlet obstruction  Patient Measurements: Height: 5\' 2"  (157.5 cm) Weight: 77.2 kg (170 lb 3.1 oz) IBW/kg (Calculated) : 54.6 TPN AdjBW (KG): 60.2 Body mass index is 31.13 kg/m. Usual Weight: ~75kg per patient   Assessment:  65 yo M with PMH significant for GERD and now newly diagnosed gastric malignancy with planned bowel resection on Monday 09/16/20. Patient reports a normal diet (3 meals/day) PTA with no dietary restrictions and no unusual weight changes recently. He stopped eating regularly a few days PTA.  Pharmacy consulted to manage TPN.  Glucose / Insulin: no hx DM - CBGs < 180.  Used 2 units SSI since TPN started Electrolytes: K down to 2.9, others WNL Renal: SCr <1, BUN WNL  Hepatic: LFTs / TG WNL, tbili normalized, albumin 3.1, prealbumin low at 7.2 Intake / Output; MIVF: UOP 0.7 ml/kg/hr, NG 880mL, net -1.3L GI Imaging:  4/28 Abx Xray: enteric tube placed in stomach GI Surgeries / Procedures:  SBR pending  Central access: PICC 09/13/20 TPN start date: 09/13/20  Nutritional Goals  1650-1850 kCal, 90-105g AA, > 1.6L fluid per day  Current Nutrition:  TPN  Plan:  Increase TPN to 50 ml/hr at 1800 (goal rate 70 ml/hr) Electrolytes in TPN: Na 35mEq/L, incr K 57mEq/L, Ca 47mEq/L, Mg 19mEq/L, Phos 48mmol/L, Cl:Ac 1:2 Add standard MVI and trace elements to TPN Continue sensitive SSI Q6H  KCL x 6 runs per MD F/U AM labs  Sagar Tengan D. Mina Marble, PharmD, BCPS, [place] 09/14/2020, 8:55 AM

## 2020-09-14 NOTE — Progress Notes (Addendum)
Progress Note     Subjective: Feeling well.    Objective: Vital signs in last 24 hours: Temp:  [97.8 F (36.6 C)-98.8 F (37.1 C)] 98.4 F (36.9 C) (04/30 0801) Pulse Rate:  [75-83] 81 (04/30 0801) Resp:  [17-20] 18 (04/30 0801) BP: (138-177)/(90-103) 154/92 (04/30 0402) SpO2:  [93 %-98 %] 94 % (04/30 0801) Last BM Date: 09/05/20  Intake/Output from previous day: 04/29 0701 - 04/30 0700 In: 1136.8 [I.V.:1036.8; NG/GT:100] Out: 2075 [Urine:1275; Emesis/NG output:800] Intake/Output this shift: No intake/output data recorded.  PE: General: pleasant, WD, overweight male who is laying in bed in NAD HEENT: head is normocephalic, atraumatic.  Sclera are noninjected. Ears and nose without any masses or lesions.  Mouth is pink and moist Heart: regular, rate, and rhythm.  Lungs:  Respiratory effort nonlabored Abd: soft, NT, ND, +BS, no masses, hernias, or organomegaly, NGT with minimal thin light drainage  MS: all 4 extremities are symmetrical with no cyanosis, clubbing, or edema. Skin: warm and dry with no masses, lesions, or rashes Neuro: Cranial nerves 2-12 grossly intact, sensation is normal throughout Psych: A&Ox3 with an appropriate affect.    Lab Results:  Recent Labs    09/13/20 0259 09/14/20 0315  WBC 13.5* 13.2*  HGB 11.8* 11.3*  HCT 32.9* 33.1*  PLT 355 330   BMET Recent Labs    09/13/20 0259 09/14/20 0315  NA 140 137  K 4.0 2.9*  CL 108 100  CO2 21* 27  GLUCOSE 105* 139*  BUN 9 11  CREATININE 0.95 0.79  CALCIUM 9.4 8.8*   PT/INR Recent Labs    09/13/20 0638  LABPROT 13.4  INR 1.0   CMP     Component Value Date/Time   NA 137 09/14/2020 0315   K 2.9 (L) 09/14/2020 0315   CL 100 09/14/2020 0315   CO2 27 09/14/2020 0315   GLUCOSE 139 (H) 09/14/2020 0315   BUN 11 09/14/2020 0315   CREATININE 0.79 09/14/2020 0315   CALCIUM 8.8 (L) 09/14/2020 0315   PROT 6.1 (L) 09/14/2020 0315   ALBUMIN 3.1 (L) 09/14/2020 0315   AST 26 09/14/2020 0315    ALT 32 09/14/2020 0315   ALKPHOS 62 09/14/2020 0315   BILITOT 0.8 09/14/2020 0315   GFRNONAA >60 09/14/2020 0315   Lipase  No results found for: LIPASE     Studies/Results: DG Chest Port 1 View  Result Date: 09/13/2020 CLINICAL DATA:  Cough and shortness of breath EXAM: PORTABLE CHEST 1 VIEW COMPARISON:  Chest CT September 05, 2020 FINDINGS: Nasogastric tube tip and side port in stomach. No edema or airspace opacity. Heart size and pulmonary vascularity are normal. No adenopathy. No bone lesions. IMPRESSION: Nasogastric tube tip and side port in stomach. Lungs clear. Cardiac silhouette within normal limits. Electronically Signed   By: Lowella Grip III M.D.   On: 09/13/2020 08:00   DG Abd Portable 1V  Result Date: 09/12/2020 CLINICAL DATA:  NG tube in place.  Gastric outlet obstruction. EXAM: PORTABLE ABDOMEN - 1 VIEW COMPARISON:  Abdominal CTA 09/05/2020 FINDINGS: Tip and side port of the enteric tube are below the diaphragm in the stomach, enteric tube is coiled within the gastric body. Decreased gastric distension from prior CT. Generalized paucity of small bowel gas. Residual high-density contrast throughout the colon. Vascular calcifications are seen. IMPRESSION: Tip and side port of the enteric tube below the diaphragm in the stomach, enteric tube coiled within the gastric body. Electronically Signed   By: Keith Rake  M.D.   On: 09/12/2020 23:28   [place] EKG SITE RITE  Result Date: 09/13/2020 If Site Rite image not attached, placement could not be confirmed due to current cardiac rhythm.   Anti-infectives: Anti-infectives (From admission, onward)   None       Assessment/Plan GOO secondary to gastric adenocarcinoma in antrum - PICC and TPN - NGT decompression - ok to have ice chips and sips for comfort - check nutritional labs Monday, if prealbumin still quite low may be best to postpone resection a week or two to improve nutrition/conditioning and improve his outcome.       LOS: 2 days    Clovis Riley, [facility] 09/14/2020, 10:31 AM Please see Amion for pager number during day hours 7:00am-4:30pm

## 2020-09-14 NOTE — Progress Notes (Signed)
PROGRESS NOTE                                                                                                                                                                                                             Patient Demographics:    Alexander [name], is a 65 y.o. male, DOB - April 27, 1956, WEX:[id number]  Outpatient Primary MD for the patient is Serita Grammes, MD    LOS - 2  Admit date - 09/12/2020    No chief complaint on file.      Brief Narrative (HPI from H&P)  - Alexander [name] is a 65 y.o. male with medical history significant for COPD, HTN, HLD, GERD who was admitted from [facility] for management of newly diagnosed gastric malignancy with gastric outlet obstruction, he had work-up at [facility] which included EGD and biopsy showing gastric adenocarcinoma, NG tube was placed and he was transferred to [facility] for further treatment.   Subjective:   Patient in bed, appears comfortable, denies any headache, no fever, no chest pain or pressure, no shortness of breath , no abdominal pain. No new focal weakness.   Assessment  & Plan :   1.  Gastric outlet obstruction caused by gastric adenocarcinoma as diagnosed with biopsy the hospital. General surgery following, will continue NG tube for decompression, per general surgery PICC line with ANA Comer to optimize him for possible surgical intervention on coming Monday or Tuesday.  2. HTN.  Catapres patch dose increased 09/14/20  along with IV labetalol &  Hydralazine as needed.  3. Upper GI bleed caused by gastric ulcer at [facility]  Resolved after IV PPI.  Type screen and monitor.   4. Hypokalemia - replaced, likely due to NG losses.       Condition - Extremely Guarded  Family Communication  : Wife Alexander [name] [phone] 09/13/2020  Code Status :  Full  Consults  :  CCS, Oncology  PUD Prophylaxis : PPI   Procedures  :             Disposition Plan  :    Status is: Inpatient  Remains inpatient appropriate because:IV treatments appropriate due to intensity of illness or inability to take PO   Dispo: The patient is from: Home  Anticipated d/c is to: Home              Patient currently is not medically stable to d/c.   Difficult to place patient No   DVT Prophylaxis  :    heparin injection 5,000 Units Start: 09/13/20 1400 SCDs Start: 09/12/20 2127    Lab Results  Component Value Date   PLT 330 09/14/2020    Diet :  Diet Order            Diet NPO time specified  Diet effective now                  Inpatient Medications  Scheduled Meds:  . Chlorhexidine Gluconate Cloth  6 each Topical Daily  . [START ON 09/20/2020] cloNIDine  0.3 mg Transdermal Q Fri  . heparin injection (subcutaneous)  5,000 Units Subcutaneous Q8H  . insulin aspart  0-9 Units Subcutaneous Q6H  . pantoprazole (PROTONIX) IV  40 mg Intravenous Q12H  . [START ON 09/15/2020] pneumococcal 23 valent vaccine  0.5 mL Intramuscular Tomorrow-1000   Continuous Infusions:  . potassium chloride 10 mEq (09/14/20 0944)  . TPN ADULT (ION) 35 mL/hr at 09/13/20 1731  . TPN ADULT (ION)     PRN Meds:.acetaminophen **OR** acetaminophen, hydrALAZINE, labetalol, [DISCONTINUED] ondansetron **OR** ondansetron (ZOFRAN) IV, sodium chloride flush  Antibiotics  :    Anti-infectives (From admission, onward)   None       Time Spent in minutes  30   Lala Lund M.D on 09/14/2020 at 10:36 AM  To page go to [email or web address]   [facility] -  Office  [phone]    See all Orders from today for further details    Objective:   Vitals:   09/13/20 2001 09/13/20 2321 09/14/20 0402 09/14/20 0801  BP: (!) 177/102 (!) 176/90 (!) 154/92   Pulse: 80 75 83 81  Resp: 20 20 18 18   Temp: 98.8 F (37.1 C) 98.8 F (37.1 C) 98.7 F (37.1 C) 98.4 F (36.9 C)  TempSrc: Oral Oral Oral Oral  SpO2: 98% 98% 93% 94%  Weight:       Height:        Wt Readings from Last 3 Encounters:  09/13/20 77.2 kg  08/20/20 81.4 kg     Intake/Output Summary (Last 24 hours) at 09/14/2020 1036 Last data filed at 09/14/2020 0659 Gross per 24 hour  Intake 1136.75 ml  Output 2075 ml  Net -938.25 ml     Physical Exam  Awake Alert, No new F.N deficits, NG in place .AT,PERRAL Supple Neck,No JVD, No cervical lymphadenopathy appriciated.  Symmetrical Chest wall movement, Good air movement bilaterally, CTAB RRR,No Gallops, Rubs or new Murmurs, No Parasternal Heave +ve B.Sounds, Abd Soft, No tenderness, No organomegaly appriciated, No rebound - guarding or rigidity. No Cyanosis, Clubbing or edema, No new Rash or bruise    Data Review:    CBC Recent Labs  Lab 09/12/20 2135 09/13/20 0259 09/14/20 0315  WBC 13.2* 13.5* 13.2*  HGB 12.0* 11.8* 11.3*  HCT 35.4* 32.9* 33.1*  PLT 360 355 330  MCV 94.4 92.4 91.2  MCH 32.0 33.1 31.1  MCHC 33.9 35.9 34.1  RDW 14.9 15.0 14.5  LYMPHSABS 2.1  --  2.6  MONOABS 1.2*  --  0.9  EOSABS 0.2  --  0.5  BASOSABS 0.1  --  0.1    Recent Labs  Lab 09/12/20 2135 09/13/20 0259 09/13/20 0638 09/13/20 0943 09/14/20 0315  NA 139 140  --   --  137  K 3.8 4.0  --   --  2.9*  CL 108 108  --   --  100  CO2 18* 21*  --   --  27  GLUCOSE 107* 105*  --   --  139*  BUN 9 9  --   --  11  CREATININE 0.99 0.95  --   --  0.79  CALCIUM 9.1 9.4  --   --  8.8*  AST 28  --   --   --  26  ALT 33  --   --   --  32  ALKPHOS 68  --   --   --  62  BILITOT 1.3*  --   --   --  0.8  ALBUMIN 3.4*  --   --   --  3.1*  MG 2.0  --   --   --  2.0  INR  --   --  1.0  --   --   HGBA1C  --   --   --  5.6  --   BNP  --   --   --   --  86.4    ------------------------------------------------------------------------------------------------------------------ Recent Labs    09/14/20 0315  TRIG 90    Lab Results  Component Value Date   HGBA1C 5.6 09/13/2020    ------------------------------------------------------------------------------------------------------------------ No results for input(s): TSH, T4TOTAL, T3FREE, THYROIDAB in the last 72 hours.  Invalid input(s): FREET3  Cardiac Enzymes No results for input(s): CKMB, TROPONINI, MYOGLOBIN in the last 168 hours.  Invalid input(s): CK ------------------------------------------------------------------------------------------------------------------    Component Value Date/Time   BNP 86.4 09/14/2020 0315    Micro Results Recent Results (from the past 240 hour(s))  SARS CORONAVIRUS 2 (TAT 6-24 HRS) Nasopharyngeal Nasopharyngeal Swab     Status: None   Collection Time: 09/13/20  5:20 AM   Specimen: Nasopharyngeal Swab  Result Value Ref Range Status   SARS Coronavirus 2 NEGATIVE NEGATIVE Final    Comment: (NOTE) SARS-CoV-2 target nucleic acids are NOT DETECTED.  The SARS-CoV-2 RNA is generally detectable in upper and lower respiratory specimens during the acute phase of infection. Negative results do not preclude SARS-CoV-2 infection, do not rule out co-infections with other pathogens, and should not be used as the sole basis for treatment or other patient management decisions. Negative results must be combined with clinical observations, patient history, and epidemiological information. The expected result is Negative.  Fact Sheet for Patients: SugarRoll.be  Fact Sheet for Healthcare Providers: https://www.woods-mathews.com/  This test is not yet approved or cleared by the [place] FDA and  has been authorized for detection and/or diagnosis of SARS-CoV-2 by FDA under an Emergency Use Authorization (EUA). This EUA will remain  in effect (meaning this test can be used) for the duration of the COVID-19 declaration under Se ction 564(b)(1) of the Act, 21 U.S.C. section 360bbb-3(b)(1), unless the authorization is terminated or revoked  sooner.  Performed at [facility], [place] [address], [place], [place] [address]     Radiology Reports DG Chest [place] 1 View  Result Date: 09/13/2020 CLINICAL DATA:  Cough and shortness of breath EXAM: PORTABLE CHEST 1 VIEW COMPARISON:  Chest CT September 05, 2020 FINDINGS: Nasogastric tube tip and side port in stomach. No edema or airspace opacity. Heart size and pulmonary vascularity are normal. No adenopathy. No bone lesions. IMPRESSION: Nasogastric tube tip and side port in stomach. Lungs clear. Cardiac silhouette within normal limits. Electronically Signed   By: Gwyndolyn Saxon  Jasmine December [name] M.D.   On: 09/13/2020 08:00   DG Abd Portable 1V  Result Date: 09/12/2020 CLINICAL DATA:  NG tube in place.  Gastric outlet obstruction. EXAM: PORTABLE ABDOMEN - 1 VIEW COMPARISON:  Abdominal CTA 09/05/2020 FINDINGS: Tip and side port of the enteric tube are below the diaphragm in the stomach, enteric tube is coiled within the gastric body. Decreased gastric distension from prior CT. Generalized paucity of small bowel gas. Residual high-density contrast throughout the colon. Vascular calcifications are seen. IMPRESSION: Tip and side port of the enteric tube below the diaphragm in the stomach, enteric tube coiled within the gastric body. Electronically Signed   By: Keith Rake M.D.   On: 09/12/2020 23:28   [place] EKG SITE RITE  Result Date: 09/13/2020 If Site Rite image not attached, placement could not be confirmed due to current cardiac rhythm.

## 2020-09-15 DIAGNOSIS — C169 Malignant neoplasm of stomach, unspecified: Secondary | ICD-10-CM | POA: Diagnosis not present

## 2020-09-15 LAB — CBC WITH DIFFERENTIAL/PLATELET
Abs Immature Granulocytes: 0.15 10*3/uL — ABNORMAL HIGH (ref 0.00–0.07)
Basophils Absolute: 0.1 10*3/uL (ref 0.0–0.1)
Basophils Relative: 1 %
Eosinophils Absolute: 0.5 10*3/uL (ref 0.0–0.5)
Eosinophils Relative: 4 %
HCT: 34.3 % — ABNORMAL LOW (ref 39.0–52.0)
Hemoglobin: 11.8 g/dL — ABNORMAL LOW (ref 13.0–17.0)
Immature Granulocytes: 1 %
Lymphocytes Relative: 18 %
Lymphs Abs: 2.4 10*3/uL (ref 0.7–4.0)
MCH: 31.7 pg (ref 26.0–34.0)
MCHC: 34.4 g/dL (ref 30.0–36.0)
MCV: 92.2 fL (ref 80.0–100.0)
Monocytes Absolute: 1.5 10*3/uL — ABNORMAL HIGH (ref 0.1–1.0)
Monocytes Relative: 11 %
Neutro Abs: 9.1 10*3/uL — ABNORMAL HIGH (ref 1.7–7.7)
Neutrophils Relative %: 65 %
Platelets: 345 10*3/uL (ref 150–400)
RBC: 3.72 MIL/uL — ABNORMAL LOW (ref 4.22–5.81)
RDW: 14.2 % (ref 11.5–15.5)
WBC: 13.8 10*3/uL — ABNORMAL HIGH (ref 4.0–10.5)
nRBC: 0 % (ref 0.0–0.2)

## 2020-09-15 LAB — GLUCOSE, CAPILLARY
Glucose-Capillary: 132 mg/dL — ABNORMAL HIGH (ref 70–99)
Glucose-Capillary: 137 mg/dL — ABNORMAL HIGH (ref 70–99)
Glucose-Capillary: 145 mg/dL — ABNORMAL HIGH (ref 70–99)
Glucose-Capillary: 160 mg/dL — ABNORMAL HIGH (ref 70–99)

## 2020-09-15 LAB — COMPREHENSIVE METABOLIC PANEL
ALT: 33 U/L (ref 0–44)
AST: 26 U/L (ref 15–41)
Albumin: 3 g/dL — ABNORMAL LOW (ref 3.5–5.0)
Alkaline Phosphatase: 61 U/L (ref 38–126)
Anion gap: 9 (ref 5–15)
BUN: 15 mg/dL (ref 8–23)
CO2: 29 mmol/L (ref 22–32)
Calcium: 8.9 mg/dL (ref 8.9–10.3)
Chloride: 100 mmol/L (ref 98–111)
Creatinine, Ser: 0.83 mg/dL (ref 0.61–1.24)
GFR, Estimated: >60 mL/min (ref >60)
Glucose, Bld: 163 mg/dL — ABNORMAL HIGH (ref 70–99)
Potassium: 3.3 mmol/L — ABNORMAL LOW (ref 3.5–5.1)
Sodium: 138 mmol/L (ref 135–145)
Total Bilirubin: 0.6 mg/dL (ref 0.3–1.2)
Total Protein: 6.2 g/dL — ABNORMAL LOW (ref 6.5–8.1)

## 2020-09-15 LAB — BRAIN NATRIURETIC PEPTIDE: B Natriuretic Peptide: 50.5 pg/mL (ref 0.0–100.0)

## 2020-09-15 LAB — TYPE AND SCREEN
ABO/RH(D): O POS
Antibody Screen: NEGATIVE

## 2020-09-15 LAB — MAGNESIUM: Magnesium: 2 mg/dL (ref 1.7–2.4)

## 2020-09-15 LAB — PHOSPHORUS: Phosphorus: 2.7 mg/dL (ref 2.5–4.6)

## 2020-09-15 MED ORDER — POTASSIUM PHOSPHATES 15 MMOLE/5ML IV SOLN
10.0000 mmol | Freq: Once | INTRAVENOUS | Status: AC
  Administered 2020-09-15: 10 mmol via INTRAVENOUS
  Filled 2020-09-15: qty 3.33

## 2020-09-15 MED ORDER — TRAVASOL 10 % IV SOLN
INTRAVENOUS | Status: AC
  Filled 2020-09-15: qty 972

## 2020-09-15 MED ORDER — NITROGLYCERIN 2 % TD OINT
0.5000 [in_us] | TOPICAL_OINTMENT | Freq: Four times a day (QID) | TRANSDERMAL | Status: DC
  Administered 2020-09-15 – 2020-09-16 (×4): 0.5 [in_us] via TOPICAL
  Filled 2020-09-15: qty 30

## 2020-09-15 MED ORDER — POTASSIUM CHLORIDE 10 MEQ/100ML IV SOLN
10.0000 meq | INTRAVENOUS | Status: AC
  Administered 2020-09-15 (×4): 10 meq via INTRAVENOUS
  Filled 2020-09-15 (×4): qty 100

## 2020-09-15 NOTE — Progress Notes (Signed)
PROGRESS NOTE                                                                                                                                                                                                             Patient Demographics:    Alexander [name], is a 65 y.o. male, DOB - 03/16/1956, DGL:[id number]  Outpatient Primary MD for the patient is Serita Grammes, MD    LOS - 3  Admit date - 09/12/2020    No chief complaint on file.      Brief Narrative (HPI from H&P)  - Alexander [name] is a 65 y.o. male with medical history significant for COPD, HTN, HLD, GERD who was admitted from [facility] for management of newly diagnosed gastric malignancy with gastric outlet obstruction, he had work-up at [facility] which included EGD and biopsy showing gastric adenocarcinoma, NG tube was placed and he was transferred to [facility] for further treatment.   Subjective:   Patient in bed, appears comfortable, denies any headache, no fever, no chest pain or pressure, no shortness of breath , no abdominal pain. No new focal weakness.   Assessment  & Plan :   1.  Gastric outlet obstruction caused by gastric adenocarcinoma as diagnosed with biopsy the hospital. General surgery following, will continue NG tube for decompression, per general surgery PICC line with ANA Comer to optimize him for possible surgical intervention on coming Monday or Tuesday.  2. HTN.  Catapres patch dose increased 09/14/20, add NTG paste along with IV labetalol &  Hydralazine as needed.  3. Upper GI bleed caused by gastric ulcer at [facility].  Resolved after IV PPI.  Type screen and monitor.   4. Hypokalemia -likely due to ongoing NG losses, replaced IV again and requested pharmacy to add some more potassium in the TPN fluid.       Condition - Extremely Guarded  Family Communication  : Wife Neoma Laming [phone] 09/13/2020  Code  Status :  Full  Consults  :  CCS, Oncology  PUD Prophylaxis : PPI   Procedures  :            Disposition Plan  :    Status is: Inpatient  Remains inpatient appropriate because:IV treatments appropriate due to intensity of illness or inability to take PO   Dispo: The patient  is from: Home              Anticipated d/c is to: Home              Patient currently is not medically stable to d/c.   Difficult to place patient No   DVT Prophylaxis  :    heparin injection 5,000 Units Start: 09/13/20 1400 SCDs Start: 09/12/20 2127    Lab Results  Component Value Date   PLT 345 09/15/2020    Diet :  Diet Order            Diet NPO time specified  Diet effective now                  Inpatient Medications  Scheduled Meds:  . Chlorhexidine Gluconate Cloth  6 each Topical Daily  . [START ON 09/20/2020] cloNIDine  0.3 mg Transdermal Q Fri  . heparin injection (subcutaneous)  5,000 Units Subcutaneous Q8H  . insulin aspart  0-9 Units Subcutaneous Q6H  . pantoprazole (PROTONIX) IV  40 mg Intravenous Q12H  . pneumococcal 23 valent vaccine  0.5 mL Intramuscular Tomorrow-1000   Continuous Infusions:  . potassium chloride 10 mEq (09/15/20 0918)  . potassium PHOSPHATE IVPB (in mmol)    . TPN ADULT (ION) 50 mL/hr at 09/14/20 1725  . TPN ADULT (ION)     PRN Meds:.acetaminophen **OR** acetaminophen, hydrALAZINE, labetalol, [DISCONTINUED] ondansetron **OR** ondansetron (ZOFRAN) IV, sodium chloride flush  Antibiotics  :    Anti-infectives (From admission, onward)   None       Time Spent in minutes  30   Lala Lund M.D on 09/15/2020 at 9:54 AM  To page go to [email or web address]   [facility] -  Office  [phone]    See all Orders from today for further details    Objective:   Vitals:   09/14/20 2349 09/15/20 0107 09/15/20 0351 09/15/20 0831  BP: (!) 158/98 (!) 149/86 (!) 165/90 (!) 153/93  Pulse: 93 96 94 84  Resp: 18  17 18   Temp: 98.7 F (37.1 C)   98.4 F (36.9 C) 98.3 F (36.8 C)  TempSrc: Oral  Oral Oral  SpO2: 94%  94% 94%  Weight:      Height:        Wt Readings from Last 3 Encounters:  09/13/20 77.2 kg  08/20/20 81.4 kg     Intake/Output Summary (Last 24 hours) at 09/15/2020 0954 Last data filed at 09/15/2020 [id number] Gross per 24 hour  Intake 1911.46 ml  Output 1650 ml  Net 261.46 ml     Physical Exam  Awake Alert, No new F.N deficits, NG in place [place].AT,PERRAL Supple Neck,No JVD, No cervical lymphadenopathy appriciated.  Symmetrical Chest wall movement, Good air movement bilaterally, CTAB RRR,No Gallops, Rubs or new Murmurs, No Parasternal Heave +ve B.Sounds, Abd Soft, No tenderness, No organomegaly appriciated, No rebound - guarding or rigidity. No Cyanosis, Clubbing or edema, No new Rash or bruise     Data Review:    CBC Recent Labs  Lab 09/12/20 2135 09/13/20 0259 09/14/20 0315 09/15/20 0512  WBC 13.2* 13.5* 13.2* 13.8*  HGB 12.0* 11.8* 11.3* 11.8*  HCT 35.4* 32.9* 33.1* 34.3*  PLT 360 355 330 345  MCV 94.4 92.4 91.2 92.2  MCH 32.0 33.1 31.1 31.7  MCHC 33.9 35.9 34.1 34.4  RDW 14.9 15.0 14.5 14.2  LYMPHSABS 2.1  --  2.6 2.4  MONOABS 1.2*  --  0.9 1.5*  EOSABS 0.2  --  0.5 0.5  BASOSABS 0.1  --  0.1 0.1    Recent Labs  Lab 09/12/20 2135 09/13/20 0259 09/13/20 7062 09/13/20 0943 09/14/20 0315 09/15/20 0512  NA 139 140  --   --  137 138  K 3.8 4.0  --   --  2.9* 3.3*  CL 108 108  --   --  100 100  CO2 18* 21*  --   --  27 29  GLUCOSE 107* 105*  --   --  139* 163*  BUN 9 9  --   --  11 15  CREATININE 0.99 0.95  --   --  0.79 0.83  CALCIUM 9.1 9.4  --   --  8.8* 8.9  AST 28  --   --   --  26 26  ALT 33  --   --   --  32 33  ALKPHOS 68  --   --   --  62 61  BILITOT 1.3*  --   --   --  0.8 0.6  ALBUMIN 3.4*  --   --   --  3.1* 3.0*  MG 2.0  --   --   --  2.0 2.0  INR  --   --  1.0  --   --   --   HGBA1C  --   --   --  5.6  --   --   BNP  --   --   --   --  86.4 50.5     ------------------------------------------------------------------------------------------------------------------ Recent Labs    09/14/20 0315  TRIG 90    Lab Results  Component Value Date   HGBA1C 5.6 09/13/2020   ------------------------------------------------------------------------------------------------------------------ No results for input(s): TSH, T4TOTAL, T3FREE, THYROIDAB in the last 72 hours.  Invalid input(s): FREET3  Cardiac Enzymes No results for input(s): CKMB, TROPONINI, MYOGLOBIN in the last 168 hours.  Invalid input(s): CK ------------------------------------------------------------------------------------------------------------------    Component Value Date/Time   BNP 50.5 09/15/2020 3762    Micro Results Recent Results (from the past 240 hour(s))  SARS CORONAVIRUS 2 (TAT 6-24 HRS) Nasopharyngeal Nasopharyngeal Swab     Status: None   Collection Time: 09/13/20  5:20 AM   Specimen: Nasopharyngeal Swab  Result Value Ref Range Status   SARS Coronavirus 2 NEGATIVE NEGATIVE Final    Comment: (NOTE) SARS-CoV-2 target nucleic acids are NOT DETECTED.  The SARS-CoV-2 RNA is generally detectable in upper and lower respiratory specimens during the acute phase of infection. Negative results do not preclude SARS-CoV-2 infection, do not rule out co-infections with other pathogens, and should not be used as the sole basis for treatment or other patient management decisions. Negative results must be combined with clinical observations, patient history, and epidemiological information. The expected result is Negative.  Fact Sheet for Patients: SugarRoll.be  Fact Sheet for Healthcare Providers: https://www.woods-mathews.com/  This test is not yet approved or cleared by the [place] FDA and  has been authorized for detection and/or diagnosis of SARS-CoV-2 by FDA under an Emergency Use Authorization (EUA). This  EUA will remain  in effect (meaning this test can be used) for the duration of the COVID-19 declaration under Se ction 564(b)(1) of the Act, 21 U.S.C. section 360bbb-3(b)(1), unless the authorization is terminated or revoked sooner.  Performed at [facility], [place] [address], [place], [place] [address]     Radiology Reports DG Chest [place] 1 View  Result Date: 09/13/2020 CLINICAL DATA:  Cough and shortness of breath  EXAM: PORTABLE CHEST 1 VIEW COMPARISON:  Chest CT September 05, 2020 FINDINGS: Nasogastric tube tip and side port in stomach. No edema or airspace opacity. Heart size and pulmonary vascularity are normal. No adenopathy. No bone lesions. IMPRESSION: Nasogastric tube tip and side port in stomach. Lungs clear. Cardiac silhouette within normal limits. Electronically Signed   By: Lowella Grip [name] M.D.   On: 09/13/2020 08:00   DG Abd Portable 1V  Result Date: 09/12/2020 CLINICAL DATA:  NG tube in place.  Gastric outlet obstruction. EXAM: PORTABLE ABDOMEN - 1 VIEW COMPARISON:  Abdominal CTA 09/05/2020 FINDINGS: Tip and side port of the enteric tube are below the diaphragm in the stomach, enteric tube is coiled within the gastric body. Decreased gastric distension from prior CT. Generalized paucity of small bowel gas. Residual high-density contrast throughout the colon. Vascular calcifications are seen. IMPRESSION: Tip and side port of the enteric tube below the diaphragm in the stomach, enteric tube coiled within the gastric body. Electronically Signed   By: Keith Rake M.D.   On: 09/12/2020 23:28   [place] EKG SITE RITE  Result Date: 09/13/2020 If Site Rite image not attached, placement could not be confirmed due to current cardiac rhythm.

## 2020-09-15 NOTE — Progress Notes (Signed)
PHARMACY - TOTAL PARENTERAL NUTRITION CONSULT NOTE  Indication: Gastric outlet obstruction  Patient Measurements: Height: 5\' 2"  (157.5 cm) Weight: 77.2 kg (170 lb 3.1 oz) IBW/kg (Calculated) : 54.6 TPN AdjBW (KG): 60.2 Body mass index is 31.13 kg/m. Usual Weight: ~75kg per patient   Assessment:  65 yo M with PMH significant for GERD and now newly diagnosed gastric malignancy with planned bowel resection. Patient reports a normal diet (3 meals/day) PTA with no dietary restrictions and no unusual weight changes recently. He stopped eating regularly a few days PTA.  Pharmacy consulted to manage TPN.  Glucose / Insulin: no hx DM - CBGs < 180.  Used 3 units SSI since TPN started Electrolytes: K up to 3.3 post 6 runs, others WNL (Phos down to low normal) Renal: SCr < 1, BUN WNL  Hepatic: LFTs / TG WNL, tbili normalized, albumin 3, prealbumin low at 7.2 Intake / Output; MIVF: UOP 0.5 ml/kg/hr, NG 555mL GI Imaging:  4/28 Abx Xray: enteric tube placed in stomach GI Surgeries / Procedures:  SBR pending  Central access: PICC 09/13/20 TPN start date: 09/13/20  Nutritional Goals  1650-1850 kCal, 90-105g AA, > 1.6L fluid per day  Current Nutrition:  TPN  Plan:  Increase TPN to goal rate 75 ml/hr at 1800 TPN will provide 97g AA and 1706 kCal, meeting 100% of needs Electrolytes in TPN: Na 95mEq/L, K 47mEq/L (= 126 mEq/d), Ca 58mEq/L, Mg 43mEq/L, incr Phos 21mmol/L, change Cl:Ac 1:1 Add standard MVI and trace elements to TPN Continue sensitive SSI Q6H - D/C if CBGs remain controlled at goal TPN rate KCL x 4 runs per MD KPhos 10 mmol IV x 1 F/U AM labs   Surgery postponing resection until nutritional status improves  Meadow Abramo D. Mina Marble, PharmD, BCPS, [place] 09/15/2020, 7:52 AM

## 2020-09-16 ENCOUNTER — Encounter (HOSPITAL_COMMUNITY)
Admission: AD | Disposition: A | Discharge status: Home / Self Care | Payer: Self-pay | Source: Other Acute Inpatient Hospital | Attending: Internal Medicine

## 2020-09-16 DIAGNOSIS — C169 Malignant neoplasm of stomach, unspecified: Secondary | ICD-10-CM | POA: Diagnosis not present

## 2020-09-16 LAB — CBC WITH DIFFERENTIAL/PLATELET
Abs Immature Granulocytes: 0.17 10*3/uL — ABNORMAL HIGH (ref 0.00–0.07)
Basophils Absolute: 0.1 10*3/uL (ref 0.0–0.1)
Basophils Relative: 1 %
Eosinophils Absolute: 0.5 10*3/uL (ref 0.0–0.5)
Eosinophils Relative: 4 %
HCT: 32.7 % — ABNORMAL LOW (ref 39.0–52.0)
Hemoglobin: 11.7 g/dL — ABNORMAL LOW (ref 13.0–17.0)
Immature Granulocytes: 2 %
Lymphocytes Relative: 22 %
Lymphs Abs: 2.6 10*3/uL (ref 0.7–4.0)
MCH: 33.5 pg (ref 26.0–34.0)
MCHC: 35.8 g/dL (ref 30.0–36.0)
MCV: 93.7 fL (ref 80.0–100.0)
Monocytes Absolute: 1.5 10*3/uL — ABNORMAL HIGH (ref 0.1–1.0)
Monocytes Relative: 13 %
Neutro Abs: 6.8 10*3/uL (ref 1.7–7.7)
Neutrophils Relative %: 58 %
Platelets: 342 10*3/uL (ref 150–400)
RBC: 3.49 MIL/uL — ABNORMAL LOW (ref 4.22–5.81)
RDW: 14.1 % (ref 11.5–15.5)
WBC: 11.6 10*3/uL — ABNORMAL HIGH (ref 4.0–10.5)
nRBC: 0 % (ref 0.0–0.2)

## 2020-09-16 LAB — COMPREHENSIVE METABOLIC PANEL
ALT: 38 U/L (ref 0–44)
AST: 28 U/L (ref 15–41)
Albumin: 2.9 g/dL — ABNORMAL LOW (ref 3.5–5.0)
Alkaline Phosphatase: 57 U/L (ref 38–126)
Anion gap: 9 (ref 5–15)
BUN: 19 mg/dL (ref 8–23)
CO2: 28 mmol/L (ref 22–32)
Calcium: 9.1 mg/dL (ref 8.9–10.3)
Chloride: 99 mmol/L (ref 98–111)
Creatinine, Ser: 0.77 mg/dL (ref 0.61–1.24)
GFR, Estimated: >60 mL/min (ref >60)
Glucose, Bld: 130 mg/dL — ABNORMAL HIGH (ref 70–99)
Potassium: 4.1 mmol/L (ref 3.5–5.1)
Sodium: 136 mmol/L (ref 135–145)
Total Bilirubin: 0.3 mg/dL (ref 0.3–1.2)
Total Protein: 6 g/dL — ABNORMAL LOW (ref 6.5–8.1)

## 2020-09-16 LAB — GLUCOSE, CAPILLARY
Glucose-Capillary: 101 mg/dL — ABNORMAL HIGH (ref 70–99)
Glucose-Capillary: 120 mg/dL — ABNORMAL HIGH (ref 70–99)
Glucose-Capillary: 123 mg/dL — ABNORMAL HIGH (ref 70–99)
Glucose-Capillary: 144 mg/dL — ABNORMAL HIGH (ref 70–99)

## 2020-09-16 LAB — BRAIN NATRIURETIC PEPTIDE: B Natriuretic Peptide: 28.8 pg/mL (ref 0.0–100.0)

## 2020-09-16 LAB — PREALBUMIN: Prealbumin: 9.5 mg/dL — ABNORMAL LOW (ref 18–38)

## 2020-09-16 LAB — PHOSPHORUS: Phosphorus: 4.4 mg/dL (ref 2.5–4.6)

## 2020-09-16 LAB — TRIGLYCERIDES: Triglycerides: 100 mg/dL (ref <150)

## 2020-09-16 LAB — MAGNESIUM: Magnesium: 2 mg/dL (ref 1.7–2.4)

## 2020-09-16 SURGERY — OPEN PROXIMAL GASTRECTOMY
Anesthesia: General

## 2020-09-16 MED ORDER — NITROGLYCERIN 2 % TD OINT
1.0000 [in_us] | TOPICAL_OINTMENT | Freq: Four times a day (QID) | TRANSDERMAL | Status: DC
  Administered 2020-09-16 – 2020-09-30 (×53): 1 [in_us] via TOPICAL
  Filled 2020-09-16 (×2): qty 30

## 2020-09-16 MED ORDER — TRAVASOL 10 % IV SOLN
INTRAVENOUS | Status: AC
  Filled 2020-09-16: qty 972

## 2020-09-16 NOTE — Progress Notes (Signed)
PHARMACY - TOTAL PARENTERAL NUTRITION CONSULT NOTE  Indication: Gastric outlet obstruction  Patient Measurements: Height: 5\' 2"  (157.5 cm) Weight: 77.2 kg (170 lb 3.1 oz) IBW/kg (Calculated) : 54.6 TPN AdjBW (KG): 60.2 Body mass index is 31.13 kg/m. Usual Weight: ~75kg per patient   Assessment:  65 yo M with PMH significant for GERD and now newly diagnosed gastric malignancy with planned bowel resection. Patient reports a normal diet (3 meals/day) PTA with no dietary restrictions and no unusual weight changes recently. He stopped eating regularly a few days PTA.  Pharmacy consulted to manage TPN.  Glucose / Insulin: no hx DM - CBGs < 180.  Used 5 units SSI yesterday. Electrolytes: K up to 4.1 and Phos up to 4.4 post 4 runs and KPhos 110mmol, others WNL (Na low normal) Renal: SCr < 1, BUN WNL  Hepatic: LFTs / TG WNL, tbili normalized, albumin 2.9, prealbumin improved to 9.5 Intake / Output; MIVF: UOP 0.3 ml/kg/hr, NG 554mL, net -1L GI Imaging:  4/28 Abx Xray: enteric tube placed in stomach GI Surgeries / Procedures:  SBR pending  Central access: PICC 09/13/20 TPN start date: 09/13/20  Nutritional Goals  1650-1850 kCal, 90-105g AA, > 1.6L fluid per day  Current Nutrition:  TPN  Plan:  Continue TPN at goal rate 75 ml/hr to provide 97g AA and 1706 kCal, meeting 100% of needs Electrolytes in TPN: incr Na 72mEq/L, K 14mEq/L (= 126 mEq/d), Ca 31mEq/L, Mg 66mEq/L, reduce Phos to 56mmol/L, Cl:Ac 1:1 Add standard MVI and trace elements to TPN Continue sensitive SSI Q6H - D/C in AM if CBGs remain controlled at goal TPN rate Repeat labs on Wed  Surgery postponing resection until nutritional status improves  Ammara Raj D. Mina Marble, PharmD, BCPS, [place] 09/16/2020, 8:03 AM

## 2020-09-16 NOTE — Progress Notes (Signed)
PROGRESS NOTE                                                                                                                                                                                                             Patient Demographics:    Alexander [name], is a 65 y.o. male, DOB - [birth year], ZDG:[id number]  Outpatient Primary MD for the patient is Serita Grammes, MD    LOS - 4  Admit date - 09/12/2020    No chief complaint on file.      Brief Narrative (HPI from H&P)  - Alexander [name] is a 65 y.o. male with medical history significant for COPD, HTN, HLD, GERD who was admitted from [facility] for management of newly diagnosed gastric malignancy with gastric outlet obstruction, he had work-up at [facility] which included EGD and biopsy showing gastric adenocarcinoma, NG tube was placed and he was transferred to [facility] for further treatment.   Subjective:   Patient in bed, appears comfortable, denies any headache, no fever, no chest pain or pressure, no shortness of breath , no abdominal pain. No new focal weakness.   Assessment  & Plan :   1.  Gastric outlet obstruction caused by gastric adenocarcinoma as diagnosed with biopsy the hospital. General surgery following, will continue NG tube for decompression, per general surgery PICC line with TNA to optimize him for possible surgical intervention this week, seen by Oncologist Dr Marin Olp as well.  2. HTN.  Catapres patch dose increased 09/14/20, increased Nitropaste on 09/16/20, on IV labetalol &  Hydralazine as needed.  3. Upper GI bleed caused by gastric ulcer at [facility].  Resolved after IV PPI.  Type screen and monitor.   4. Hypokalemia - likely due to ongoing NG losses, replaced and stable.       Condition - Extremely Guarded  Family Communication  : Wife Neoma Laming [phone] 09/13/2020, wife bedside 09/16/20  Code Status :   Full  Consults  :  CCS, Oncology  PUD Prophylaxis : PPI   Procedures  :      R. Arm PICC 09/13/20      Disposition Plan  :    Status is: Inpatient  Remains inpatient appropriate because:IV treatments appropriate due to intensity of illness or inability to take PO   Dispo: The patient is from:  Home              Anticipated d/c is to: Home              Patient currently is not medically stable to d/c.   Difficult to place patient No   DVT Prophylaxis  :    heparin injection 5,000 Units Start: 09/13/20 1400 SCDs Start: 09/12/20 2127    Lab Results  Component Value Date   PLT 342 09/16/2020    Diet :  Diet Order            Diet NPO time specified  Diet effective now                  Inpatient Medications  Scheduled Meds:  . Chlorhexidine Gluconate Cloth  6 each Topical Daily  . [START ON 09/20/2020] cloNIDine  0.3 mg Transdermal Q Fri  . heparin injection (subcutaneous)  5,000 Units Subcutaneous Q8H  . insulin aspart  0-9 Units Subcutaneous Q6H  . nitroGLYCERIN  0.5 inch Topical Q6H  . pantoprazole (PROTONIX) IV  40 mg Intravenous Q12H  . pneumococcal 23 valent vaccine  0.5 mL Intramuscular Tomorrow-1000   Continuous Infusions:  . TPN ADULT (ION) 75 mL/hr at 09/15/20 1716  . TPN ADULT (ION)     PRN Meds:.acetaminophen **OR** acetaminophen, hydrALAZINE, labetalol, [DISCONTINUED] ondansetron **OR** ondansetron (ZOFRAN) IV, sodium chloride flush  Antibiotics  :    Anti-infectives (From admission, onward)   None       Time Spent in minutes  30   Lala Lund M.D on 09/16/2020 at 10:50 AM  To page go to [email or web address]   [facility] -  Office  [phone]    See all Orders from today for further details    Objective:   Vitals:   09/15/20 1957 09/15/20 2347 09/16/20 0403 09/16/20 0811  BP: (!) 151/95 (!) 160/90 138/90 (!) 149/98  Pulse: 85 83 88 81  Resp: 19 17 17 18   Temp: 98.4 F (36.9 C) 98.3 F (36.8 C) 98.3 F (36.8 C) 98.7  F (37.1 C)  TempSrc: Oral Oral Oral Oral  SpO2: 94% 92% 94% 95%  Weight:      Height:        Wt Readings from Last 3 Encounters:  09/13/20 77.2 kg  08/20/20 81.4 kg     Intake/Output Summary (Last 24 hours) at 09/16/2020 1050 Last data filed at 09/16/2020 0715 Gross per 24 hour  Intake 717.31 ml  Output 1025 ml  Net -307.69 ml     Physical Exam  Awake Alert, No new F.N deficits, NG in place [place].AT,PERRAL Supple Neck,No JVD, No cervical lymphadenopathy appriciated.  Symmetrical Chest wall movement, Good air movement bilaterally, CTAB RRR,No Gallops, Rubs or new Murmurs, No Parasternal Heave +ve B.Sounds, Abd Soft, No tenderness, No organomegaly appriciated, No rebound - guarding or rigidity. No Cyanosis, Clubbing or edema, No new Rash or bruise    Data Review:    CBC Recent Labs  Lab 09/12/20 2135 09/13/20 0259 09/14/20 0315 09/15/20 0512 09/16/20 0433  WBC 13.2* 13.5* 13.2* 13.8* 11.6*  HGB 12.0* 11.8* 11.3* 11.8* 11.7*  HCT 35.4* 32.9* 33.1* 34.3* 32.7*  PLT 360 355 330 345 342  MCV 94.4 92.4 91.2 92.2 93.7  MCH 32.0 33.1 31.1 31.7 33.5  MCHC 33.9 35.9 34.1 34.4 35.8  RDW 14.9 15.0 14.5 14.2 14.1  LYMPHSABS 2.1  --  2.6 2.4 2.6  MONOABS 1.2*  --  0.9 1.5* 1.5*  EOSABS 0.2  --  0.5 0.5 0.5  BASOSABS 0.1  --  0.1 0.1 0.1    Recent Labs  Lab 09/12/20 2135 09/13/20 0259 09/13/20 9326 09/13/20 0943 09/14/20 0315 09/15/20 0512 09/16/20 0433  NA 139 140  --   --  137 138 136  K 3.8 4.0  --   --  2.9* 3.3* 4.1  CL 108 108  --   --  100 100 99  CO2 18* 21*  --   --  27 29 28   GLUCOSE 107* 105*  --   --  139* 163* 130*  BUN 9 9  --   --  11 15 19   CREATININE 0.99 0.95  --   --  0.79 0.83 0.77  CALCIUM 9.1 9.4  --   --  8.8* 8.9 9.1  AST 28  --   --   --  26 26 28   ALT 33  --   --   --  32 33 38  ALKPHOS 68  --   --   --  62 61 57  BILITOT 1.3*  --   --   --  0.8 0.6 0.3  ALBUMIN 3.4*  --   --   --  3.1* 3.0* 2.9*  MG 2.0  --   --   --  2.0 2.0 2.0   INR  --   --  1.0  --   --   --   --   HGBA1C  --   --   --  5.6  --   --   --   BNP  --   --   --   --  86.4 50.5 28.8    ------------------------------------------------------------------------------------------------------------------ Recent Labs    09/14/20 0315 09/16/20 0433  TRIG 90 100    Lab Results  Component Value Date   HGBA1C 5.6 09/13/2020   ------------------------------------------------------------------------------------------------------------------ No results for input(s): TSH, T4TOTAL, T3FREE, THYROIDAB in the last 72 hours.  Invalid input(s): FREET3  Cardiac Enzymes No results for input(s): CKMB, TROPONINI, MYOGLOBIN in the last 168 hours.  Invalid input(s): CK ------------------------------------------------------------------------------------------------------------------    Component Value Date/Time   BNP 28.8 09/16/2020 0433    Micro Results Recent Results (from the past 240 hour(s))  SARS CORONAVIRUS 2 (TAT 6-24 HRS) Nasopharyngeal Nasopharyngeal Swab     Status: None   Collection Time: 09/13/20  5:20 AM   Specimen: Nasopharyngeal Swab  Result Value Ref Range Status   SARS Coronavirus 2 NEGATIVE NEGATIVE Final    Comment: (NOTE) SARS-CoV-2 target nucleic acids are NOT DETECTED.  The SARS-CoV-2 RNA is generally detectable in upper and lower respiratory specimens during the acute phase of infection. Negative results do not preclude SARS-CoV-2 infection, do not rule out co-infections with other pathogens, and should not be used as the sole basis for treatment or other patient management decisions. Negative results must be combined with clinical observations, patient history, and epidemiological information. The expected result is Negative.  Fact Sheet for Patients: SugarRoll.be  Fact Sheet for Healthcare Providers: https://www.woods-mathews.com/  This test is not yet approved or cleared by the  [place] FDA and  has been authorized for detection and/or diagnosis of SARS-CoV-2 by FDA under an Emergency Use Authorization (EUA). This EUA will remain  in effect (meaning this test can be used) for the duration of the COVID-19 declaration under Se ction 564(b)(1) of the Act, 21 U.S.C. section 360bbb-3(b)(1), unless the authorization is terminated or revoked sooner.  Performed at [facility], [place]  [address] [place], [place] [address]     Radiology Reports DG Chest [place] 1 View  Result Date: 09/13/2020 CLINICAL DATA:  Cough and shortness of breath EXAM: PORTABLE CHEST 1 VIEW COMPARISON:  Chest CT September 05, 2020 FINDINGS: Nasogastric tube tip and side port in stomach. No edema or airspace opacity. Heart size and pulmonary vascularity are normal. No adenopathy. No bone lesions. IMPRESSION: Nasogastric tube tip and side port in stomach. Lungs clear. Cardiac silhouette within normal limits. Electronically Signed   By: Lowella Grip [name] M.D.   On: 09/13/2020 08:00   DG Abd Portable 1V  Result Date: 09/12/2020 CLINICAL DATA:  NG tube in place.  Gastric outlet obstruction. EXAM: PORTABLE ABDOMEN - 1 VIEW COMPARISON:  Abdominal CTA 09/05/2020 FINDINGS: Tip and side port of the enteric tube are below the diaphragm in the stomach, enteric tube is coiled within the gastric body. Decreased gastric distension from prior CT. Generalized paucity of small bowel gas. Residual high-density contrast throughout the colon. Vascular calcifications are seen. IMPRESSION: Tip and side port of the enteric tube below the diaphragm in the stomach, enteric tube coiled within the gastric body. Electronically Signed   By: Keith Rake M.D.   On: 09/12/2020 23:28   [place] EKG SITE RITE  Result Date: 09/13/2020 If Site Rite image not attached, placement could not be confirmed due to current cardiac rhythm.

## 2020-09-16 NOTE — Progress Notes (Signed)
   Progress Note     Subjective: Patient and wife disappointed that he is not having surgery today but do understand reasoning. Patient denies pain and reports BM yesterday. NGT present and patient on TPN.   Objective: Vital signs in last 24 hours: Temp:  [97.9 F (36.6 C)-98.7 F (37.1 C)] 98.7 F (37.1 C) (05/02 0811) Pulse Rate:  [80-88] 81 (05/02 0811) Resp:  [17-19] 18 (05/02 0811) BP: (138-160)/(87-98) 149/98 (05/02 0811) SpO2:  [92 %-95 %] 95 % (05/02 0811) Last BM Date: 09/05/20  Intake/Output from previous day: 05/01 0701 - 05/02 0700 In: 1657.2 [I.V.:1242.6; NG/GT:237; IV Piggyback:177.5] Out: 1025 [Urine:525; Emesis/NG output:500] Intake/Output this shift: Total I/O In: -  Out: 200 [Urine:200]  PE: General: pleasant, WD, overweight male who is sitting in chair  HEENT: head is normocephalic, atraumatic.  Sclera are noninjected. Ears and nose without any masses or lesions.  Mouth is pink and moist Heart: regular, rate, and rhythm.  Lungs:  Respiratory effort nonlabored Abd: soft, NT, ND, +BS, no masses, hernias, or organomegaly, NGT with minimal thin yellowish drainage  MS: all 4 extremities are symmetrical with no cyanosis, clubbing, or edema. Skin: warm and dry with no masses, lesions, or rashes Neuro: Cranial nerves 2-12 grossly intact, sensation is normal throughout Psych: A&Ox3 with an appropriate affect.     Lab Results:  Recent Labs    09/15/20 0512 09/16/20 0433  WBC 13.8* 11.6*  HGB 11.8* 11.7*  HCT 34.3* 32.7*  PLT 345 342   BMET Recent Labs    09/15/20 0512 09/16/20 0433  NA 138 136  K 3.3* 4.1  CL 100 99  CO2 29 28  GLUCOSE 163* 130*  BUN 15 19  CREATININE 0.83 0.77  CALCIUM 8.9 9.1   PT/INR No results for input(s): LABPROT, INR in the last 72 hours. CMP     Component Value Date/Time   NA 136 09/16/2020 0433   K 4.1 09/16/2020 0433   CL 99 09/16/2020 0433   CO2 28 09/16/2020 0433   GLUCOSE 130 (H) 09/16/2020 0433   BUN  19 09/16/2020 0433   CREATININE 0.77 09/16/2020 0433   CALCIUM 9.1 09/16/2020 0433   PROT 6.0 (L) 09/16/2020 0433   ALBUMIN 2.9 (L) 09/16/2020 0433   AST 28 09/16/2020 0433   ALT 38 09/16/2020 0433   ALKPHOS 57 09/16/2020 0433   BILITOT 0.3 09/16/2020 0433   GFRNONAA >60 09/16/2020 0433   Lipase  No results found for: LIPASE     Studies/Results: No results found.  Anti-infectives: Anti-infectives (From admission, onward)   None       Assessment/Plan GOO secondary to gastric adenocarcinoma in antrum - PICC and TPN - NGT decompression - ok to have ice chips and sips for comfort - prealbumin 7.3>9.5 - continue TPN to improve nutrition and conditioning prior to surgery. Will discuss with Dr. Zenia Resides and Dr. Barry Dienes surgical planning which may be later this week vs next week. No emergency surgical intervention needed at this time.   LOS: 4 days    Norm Parcel, [facility] Surgery 09/16/2020, 9:49 AM Please see Amion for pager number during day hours 7:00am-4:30pm

## 2020-09-17 ENCOUNTER — Inpatient Hospital Stay (HOSPITAL_COMMUNITY): Payer: PPO

## 2020-09-17 DIAGNOSIS — C169 Malignant neoplasm of stomach, unspecified: Secondary | ICD-10-CM | POA: Diagnosis not present

## 2020-09-17 LAB — COMPREHENSIVE METABOLIC PANEL
ALT: 41 U/L (ref 0–44)
AST: 29 U/L (ref 15–41)
Albumin: 2.8 g/dL — ABNORMAL LOW (ref 3.5–5.0)
Alkaline Phosphatase: 59 U/L (ref 38–126)
Anion gap: 8 (ref 5–15)
BUN: 23 mg/dL (ref 8–23)
CO2: 26 mmol/L (ref 22–32)
Calcium: 9.1 mg/dL (ref 8.9–10.3)
Chloride: 100 mmol/L (ref 98–111)
Creatinine, Ser: 0.85 mg/dL (ref 0.61–1.24)
GFR, Estimated: >60 mL/min (ref >60)
Glucose, Bld: 137 mg/dL — ABNORMAL HIGH (ref 70–99)
Potassium: 4.4 mmol/L (ref 3.5–5.1)
Sodium: 134 mmol/L — ABNORMAL LOW (ref 135–145)
Total Bilirubin: 0.5 mg/dL (ref 0.3–1.2)
Total Protein: 6.1 g/dL — ABNORMAL LOW (ref 6.5–8.1)

## 2020-09-17 LAB — CBC WITH DIFFERENTIAL/PLATELET
Abs Immature Granulocytes: 0.24 10*3/uL — ABNORMAL HIGH (ref 0.00–0.07)
Basophils Absolute: 0.1 10*3/uL (ref 0.0–0.1)
Basophils Relative: 1 %
Eosinophils Absolute: 0.6 10*3/uL — ABNORMAL HIGH (ref 0.0–0.5)
Eosinophils Relative: 4 %
HCT: 33.6 % — ABNORMAL LOW (ref 39.0–52.0)
Hemoglobin: 12.1 g/dL — ABNORMAL LOW (ref 13.0–17.0)
Immature Granulocytes: 2 %
Lymphocytes Relative: 22 %
Lymphs Abs: 2.9 10*3/uL (ref 0.7–4.0)
MCH: 33.5 pg (ref 26.0–34.0)
MCHC: 36 g/dL (ref 30.0–36.0)
MCV: 93.1 fL (ref 80.0–100.0)
Monocytes Absolute: 1.7 10*3/uL — ABNORMAL HIGH (ref 0.1–1.0)
Monocytes Relative: 13 %
Neutro Abs: 7.6 10*3/uL (ref 1.7–7.7)
Neutrophils Relative %: 58 %
Platelets: 335 10*3/uL (ref 150–400)
RBC: 3.61 MIL/uL — ABNORMAL LOW (ref 4.22–5.81)
RDW: 14.1 % (ref 11.5–15.5)
WBC: 13.1 10*3/uL — ABNORMAL HIGH (ref 4.0–10.5)
nRBC: 0 % (ref 0.0–0.2)

## 2020-09-17 LAB — MAGNESIUM: Magnesium: 2.1 mg/dL (ref 1.7–2.4)

## 2020-09-17 LAB — GLUCOSE, CAPILLARY: Glucose-Capillary: 136 mg/dL — ABNORMAL HIGH (ref 70–99)

## 2020-09-17 LAB — BRAIN NATRIURETIC PEPTIDE: B Natriuretic Peptide: 19.9 pg/mL (ref 0.0–100.0)

## 2020-09-17 MED ORDER — TRAVASOL 10 % IV SOLN
INTRAVENOUS | Status: AC
  Filled 2020-09-17: qty 972

## 2020-09-17 NOTE — Progress Notes (Signed)
PHARMACY - TOTAL PARENTERAL NUTRITION CONSULT NOTE  Indication: Gastric outlet obstruction  Patient Measurements: Height: 5\' 2"  (157.5 cm) Weight: 77.2 kg (170 lb 3.1 oz) IBW/kg (Calculated) : 54.6 TPN AdjBW (KG): 60.2 Body mass index is 31.13 kg/m. Usual Weight: ~75kg per patient   Assessment:  65 yo M with PMH significant for GERD and now newly diagnosed gastric malignancy with planned bowel resection. Patient reports a normal diet (3 meals/day) PTA with no dietary restrictions and no unusual weight changes recently. He stopped eating regularly a few days PTA.  Pharmacy consulted to manage TPN.  Glucose / Insulin: no hx DM - CBGs < 180.  Used 2 units SSI yesterday. Electrolytes: Na 134, others WNL  Renal: SCr < 1, BUN WNL  Hepatic: LFTs / TG WNL, tbili normalized, albumin 2.8, prealbumin improved to 9.5 Intake / Output; MIVF: UOP 0.5 ml/kg/hr, NG 469mL, I/O's even GI Imaging:  4/28 Abx Xray: enteric tube placed in stomach GI Surgeries / Procedures:  SBR pending  Central access: PICC 09/13/20 TPN start date: 09/13/20  Nutritional Goals  1650-1850 kCal, 90-105g AA, > 1.6L fluid per day  Current Nutrition:  TPN  Plan:  Continue TPN at goal rate 75 ml/hr to provide 97g AA and 1706 kCal, meeting 100% of needs Electrolytes in TPN: incr Na 166mEq/L, reduce K slightly to 30mEq/L, Ca 18mEq/L, Mg 23mEq/L, reduce Phos to 13mmol/L on 5/2, Cl:Ac 1:1 Add standard MVI and trace elements to TPN D/C SSI/CBG checks.  Consider restarting post surgery pending glucose on AM labs. Standard TPN labs on Mon/Thurs  Surgery postponing resection until nutritional status improves, later this week vs next week  Xaden Kaufman D. Mina Marble, PharmD, BCPS, [place] 09/17/2020, 7:34 AM

## 2020-09-17 NOTE — Progress Notes (Signed)
   Progress Note     Subjective: No complaints, updated patient on plan for OR early next week.   Objective: Vital signs in last 24 hours: Temp:  [98 F (36.7 C)-98.8 F (37.1 C)] 98.2 F (36.8 C) (05/03 0815) Pulse Rate:  [81-89] 81 (05/03 0815) Resp:  [16-18] 18 (05/03 0815) BP: (111-147)/(78-97) 140/89 (05/03 0815) SpO2:  [94 %-97 %] 97 % (05/03 0815) Last BM Date: 09/15/20  Intake/Output from previous day: 05/02 0701 - 05/03 0700 In: 2558.1 [I.V.:2558.1] Out: 33 [Urine:1000; Emesis/NG output:425] Intake/Output this shift: Total I/O In: -  Out: 300 [Urine:300]  PE: General: pleasant, WD,overweightmale who is sitting in chair  HEENT: head is normocephalic, atraumatic. Sclera are noninjected. Ears and nose without any masses or lesions. Mouth is pink and moist Heart: regular, rate, and rhythm.  Lungs: Respiratory effort nonlabored Abd: soft, NT, ND, +BS, no masses, hernias, or organomegaly, NGT with minimal thin yellowish drainage MS: all 4 extremities are symmetrical with no cyanosis, clubbing, or edema. Skin: warm and dry with no masses, lesions, or rashes Neuro: Cranial nerves 2-12 grossly intact, sensation is normal throughout Psych: A&Ox3 with an appropriate affect   Lab Results:  Recent Labs    09/16/20 0433 09/17/20 0342  WBC 11.6* 13.1*  HGB 11.7* 12.1*  HCT 32.7* 33.6*  PLT 342 335   BMET Recent Labs    09/16/20 0433 09/17/20 0342  NA 136 134*  K 4.1 4.4  CL 99 100  CO2 28 26  GLUCOSE 130* 137*  BUN 19 23  CREATININE 0.77 0.85  CALCIUM 9.1 9.1   PT/INR No results for input(s): LABPROT, INR in the last 72 hours. CMP     Component Value Date/Time   NA 134 (L) 09/17/2020 0342   K 4.4 09/17/2020 0342   CL 100 09/17/2020 0342   CO2 26 09/17/2020 0342   GLUCOSE 137 (H) 09/17/2020 0342   BUN 23 09/17/2020 0342   CREATININE 0.85 09/17/2020 0342   CALCIUM 9.1 09/17/2020 0342   PROT 6.1 (L) 09/17/2020 0342   ALBUMIN 2.8 (L)  09/17/2020 0342   AST 29 09/17/2020 0342   ALT 41 09/17/2020 0342   ALKPHOS 59 09/17/2020 0342   BILITOT 0.5 09/17/2020 0342   GFRNONAA >60 09/17/2020 0342   Lipase  No results found for: LIPASE     Studies/Results: No results found.  Anti-infectives: Anti-infectives (From admission, onward)   None       Assessment/Plan GOO secondary to gastric adenocarcinoma in antrum - PICC and TPN - NGT decompression - ok to have ice chips and sips for comfort - prealbumin 7.3>9.5 - continue TPN to improve nutrition and conditioning prior to surgery. Most likely will plan surgery early next week at [facility] with Dr. Barry Dienes. Patient could transfer over at the end of this week or weekend and stay there for surgical care.   LOS: 5 days    Norm Parcel, [facility] Surgery 09/17/2020, 10:51 AM Please see Amion for pager number during day hours 7:00am-4:30pm

## 2020-09-17 NOTE — Progress Notes (Signed)
PROGRESS NOTE                                                                                                                                                                                                             Patient Demographics:    Alexander [name], is a 65 y.o. male, DOB - 10-Sep-1955, MWN:[id number]  Outpatient Primary MD for the patient is Serita Grammes, MD    LOS - 5  Admit date - 09/12/2020    No chief complaint on file.      Brief Narrative (HPI from H&P)  - Alexander [name] is a 65 y.o. male with medical history significant for COPD, HTN, HLD, GERD who was admitted from [facility] for management of newly diagnosed gastric malignancy with gastric outlet obstruction, he had work-up at [facility] which included EGD and biopsy showing gastric adenocarcinoma, NG tube was placed and he was transferred to [facility] for further treatment.   Subjective:   Patient in bed, appears comfortable, denies any headache, no fever, no chest pain or pressure, no shortness of breath , no abdominal pain. No new focal weakness.   Assessment  & Plan :   1.  Gastric outlet obstruction caused by gastric adenocarcinoma as diagnosed with biopsy the hospital. General surgery following, will continue NG tube for decompression, per general surgery PICC line with TNA to optimize him for possible surgical intervention later this week, seen by Oncologist Dr Marin Olp as well.  2. HTN.  Catapres patch dose increased 09/14/20, increased Nitropaste on 09/16/20, on IV labetalol &  Hydralazine as needed.  3. Upper GI bleed caused by gastric ulcer at [facility].  Resolved after IV PPI.  Type screen and monitor.   4. Hypokalemia - likely due to ongoing NG losses, replaced and stable.       Condition - Extremely Guarded  Family Communication  : Wife Neoma Laming [phone] 09/13/2020, wife bedside 09/16/20  Code Status :   Full  Consults  :  CCS, Oncology  PUD Prophylaxis : PPI   Procedures  :      R. Arm PICC 09/13/20      Disposition Plan  :    Status is: Inpatient  Remains inpatient appropriate because:IV treatments appropriate due to intensity of illness or inability to take PO   Dispo: The patient is  from: Home              Anticipated d/c is to: Home              Patient currently is not medically stable to d/c.   Difficult to place patient No   DVT Prophylaxis  :    heparin injection 5,000 Units Start: 09/13/20 1400 SCDs Start: 09/12/20 2127    Lab Results  Component Value Date   PLT 335 09/17/2020    Diet :  Diet Order            Diet NPO time specified  Diet effective now                  Inpatient Medications  Scheduled Meds:  . Chlorhexidine Gluconate Cloth  6 each Topical Daily  . [START ON 09/20/2020] cloNIDine  0.3 mg Transdermal Q Fri  . heparin injection (subcutaneous)  5,000 Units Subcutaneous Q8H  . nitroGLYCERIN  1 inch Topical Q6H  . pantoprazole (PROTONIX) IV  40 mg Intravenous Q12H  . pneumococcal 23 valent vaccine  0.5 mL Intramuscular Tomorrow-1000   Continuous Infusions:  . TPN ADULT (ION) 75 mL/hr at 09/16/20 1802  . TPN ADULT (ION)     PRN Meds:.acetaminophen **OR** acetaminophen, hydrALAZINE, labetalol, [DISCONTINUED] ondansetron **OR** ondansetron (ZOFRAN) IV, sodium chloride flush  Antibiotics  :    Anti-infectives (From admission, onward)   None       Time Spent in minutes  30   Lala Lund M.D on 09/17/2020 at 9:27 AM  To page go to [email or web address]   [facility] -  Office  [phone]    See all Orders from today for further details    Objective:   Vitals:   09/16/20 1829 09/16/20 2043 09/17/20 0447 09/17/20 0815  BP: (!) 143/95 (!) 147/97 111/78 140/89  Pulse: 84 89 86 81  Resp: 18 18 16 18   Temp: 98 F (36.7 C) 98.8 F (37.1 C) 98.4 F (36.9 C) 98.2 F (36.8 C)  TempSrc: Oral Oral Oral Oral  SpO2: 94%  96% 95% 97%  Weight:      Height:        Wt Readings from Last 3 Encounters:  09/13/20 77.2 kg  08/20/20 81.4 kg     Intake/Output Summary (Last 24 hours) at 09/17/2020 6606 Last data filed at 09/17/2020 0817 Gross per 24 hour  Intake 2558.1 ml  Output 1525 ml  Net 1033.1 ml     Physical Exam  Awake Alert, No new F.N deficits, NG in place [place].AT,PERRAL Supple Neck,No JVD, No cervical lymphadenopathy appriciated.  Symmetrical Chest wall movement, Good air movement bilaterally, CTAB RRR,No Gallops, Rubs or new Murmurs, No Parasternal Heave +ve B.Sounds, Abd Soft, No tenderness, No organomegaly appriciated, No rebound - guarding or rigidity. No Cyanosis, Clubbing or edema, No new Rash or bruise     Data Review:    CBC Recent Labs  Lab 09/12/20 2135 09/13/20 0259 09/14/20 0315 09/15/20 0512 09/16/20 0433 09/17/20 0342  WBC 13.2* 13.5* 13.2* 13.8* 11.6* 13.1*  HGB 12.0* 11.8* 11.3* 11.8* 11.7* 12.1*  HCT 35.4* 32.9* 33.1* 34.3* 32.7* 33.6*  PLT 360 355 330 345 342 335  MCV 94.4 92.4 91.2 92.2 93.7 93.1  MCH 32.0 33.1 31.1 31.7 33.5 33.5  MCHC 33.9 35.9 34.1 34.4 35.8 36.0  RDW 14.9 15.0 14.5 14.2 14.1 14.1  LYMPHSABS 2.1  --  2.6 2.4 2.6 2.9  MONOABS 1.2*  --  0.9 1.5* 1.5*  1.7*  EOSABS 0.2  --  0.5 0.5 0.5 0.6*  BASOSABS 0.1  --  0.1 0.1 0.1 0.1    Recent Labs  Lab 09/12/20 2135 09/13/20 0259 09/13/20 [id number] 09/13/20 0943 09/14/20 0315 09/15/20 0512 09/16/20 0433 09/17/20 0342  NA 139 140  --   --  137 138 136 134*  K 3.8 4.0  --   --  2.9* 3.3* 4.1 4.4  CL 108 108  --   --  100 100 99 100  CO2 18* 21*  --   --  27 29 28 26   GLUCOSE 107* 105*  --   --  139* 163* 130* 137*  BUN 9 9  --   --  11 15 19 23   CREATININE 0.99 0.95  --   --  0.79 0.83 0.77 0.85  CALCIUM 9.1 9.4  --   --  8.8* 8.9 9.1 9.1  AST 28  --   --   --  26 26 28 29   ALT 33  --   --   --  32 33 38 41  ALKPHOS 68  --   --   --  62 61 57 59  BILITOT 1.3*  --   --   --  0.8 0.6 0.3 0.5   ALBUMIN 3.4*  --   --   --  3.1* 3.0* 2.9* 2.8*  MG 2.0  --   --   --  2.0 2.0 2.0 2.1  INR  --   --  1.0  --   --   --   --   --   HGBA1C  --   --   --  5.6  --   --   --   --   BNP  --   --   --   --  86.4 50.5 28.8 19.9    ------------------------------------------------------------------------------------------------------------------ Recent Labs    09/16/20 0433  TRIG 100    Lab Results  Component Value Date   HGBA1C 5.6 09/13/2020   ------------------------------------------------------------------------------------------------------------------ No results for input(s): TSH, T4TOTAL, T3FREE, THYROIDAB in the last 72 hours.  Invalid input(s): FREET3  Cardiac Enzymes No results for input(s): CKMB, TROPONINI, MYOGLOBIN in the last 168 hours.  Invalid input(s): CK ------------------------------------------------------------------------------------------------------------------    Component Value Date/Time   BNP 19.9 09/17/2020 0342    Micro Results Recent Results (from the past 240 hour(s))  SARS CORONAVIRUS 2 (TAT 6-24 HRS) Nasopharyngeal Nasopharyngeal Swab     Status: None   Collection Time: 09/13/20  5:20 AM   Specimen: Nasopharyngeal Swab  Result Value Ref Range Status   SARS Coronavirus 2 NEGATIVE NEGATIVE Final    Comment: (NOTE) SARS-CoV-2 target nucleic acids are NOT DETECTED.  The SARS-CoV-2 RNA is generally detectable in upper and lower respiratory specimens during the acute phase of infection. Negative results do not preclude SARS-CoV-2 infection, do not rule out co-infections with other pathogens, and should not be used as the sole basis for treatment or other patient management decisions. Negative results must be combined with clinical observations, patient history, and epidemiological information. The expected result is Negative.  Fact Sheet for Patients: SugarRoll.be  Fact Sheet for Healthcare  Providers: https://www.woods-mathews.com/  This test is not yet approved or cleared by the [place] FDA and  has been authorized for detection and/or diagnosis of SARS-CoV-2 by FDA under an Emergency Use Authorization (EUA). This EUA will remain  in effect (meaning this test can be used) for the duration of the COVID-19  declaration under Se ction 564(b)(1) of the Act, 21 U.S.C. section 360bbb-3(b)(1), unless the authorization is terminated or revoked sooner.  Performed at [facility], [place] [address], [place], [place] [address]     Radiology Reports DG Chest [place] 1 View  Result Date: 09/13/2020 CLINICAL DATA:  Cough and shortness of breath EXAM: PORTABLE CHEST 1 VIEW COMPARISON:  Chest CT September 05, 2020 FINDINGS: Nasogastric tube tip and side port in stomach. No edema or airspace opacity. Heart size and pulmonary vascularity are normal. No adenopathy. No bone lesions. IMPRESSION: Nasogastric tube tip and side port in stomach. Lungs clear. Cardiac silhouette within normal limits. Electronically Signed   By: Lowella Grip [name] M.D.   On: 09/13/2020 08:00   DG Abd Portable 1V  Result Date: 09/12/2020 CLINICAL DATA:  NG tube in place.  Gastric outlet obstruction. EXAM: PORTABLE ABDOMEN - 1 VIEW COMPARISON:  Abdominal CTA 09/05/2020 FINDINGS: Tip and side port of the enteric tube are below the diaphragm in the stomach, enteric tube is coiled within the gastric body. Decreased gastric distension from prior CT. Generalized paucity of small bowel gas. Residual high-density contrast throughout the colon. Vascular calcifications are seen. IMPRESSION: Tip and side port of the enteric tube below the diaphragm in the stomach, enteric tube coiled within the gastric body. Electronically Signed   By: Keith Rake M.D.   On: 09/12/2020 23:28   [place] EKG SITE RITE  Result Date: 09/13/2020 If Site Rite image not attached, placement could not be confirmed due to current cardiac  rhythm.

## 2020-09-17 NOTE — Evaluation (Signed)
Physical Therapy Evaluation Patient Details Name: Alexander [name] MRN: [id number] DOB: June 03, 1955 Today's Date: 09/17/2020   History of Present Illness  65yo male admitted 09/12/20 via transfer from [facility] secondary to gastic malignancy with gastric outlet obstruction. Received EGD and NGT 09/06/20. PMH COPD  Clinical Impression   Patient received up in recliner, pleasant and cooperative. Of note, the surgery he was scheduled to have was cancelled/rescheduled to early next week (potentially at [facility]), and since he has been sedentary for the past few days, opted to participate in PT eval. Able to mobilize on a independent basis well and without concern- educated of benefits of ambulating in hallway with spouse while awaiting surgery, which he should be able to do quite safely. Will keep on caseload at 1x/week for now, however will re-evaluate and update recommendations as needed after surgery. Left up in recliner with all needs met, spouse present.     Follow Up Recommendations No PT follow up;Other (comment) (will update reccs if necessary following surgery)    Equipment Recommendations  Other (comment) (to be updated following surgery)    Recommendations for Other Services       Precautions / Restrictions Precautions Precautions: None Precaution Comments: NG tube Restrictions Weight Bearing Restrictions: No      Mobility  Bed Mobility               General bed mobility comments: OOB in chair    Transfers Overall transfer level: Independent                  Ambulation/Gait Ambulation/Gait assistance: Modified independent (Device/Increase time) Gait Distance (Feet): 200 Feet Assistive device: IV Pole Gait Pattern/deviations: WFL(Within Functional Limits);Step-through pattern Gait velocity: WNL   General Gait Details: no significant gait deviations noted, pattern WNL  Stairs            Wheelchair Mobility    Modified Rankin  (Stroke Patients Only)       Balance Overall balance assessment: Independent                                           Pertinent Vitals/Pain Pain Assessment: No/denies pain    Home Living Family/patient expects to be discharged to:: Private residence Living Arrangements: Spouse/significant other Available Help at Discharge: Family;Available 24 hours/day Type of Home: House Home Access: Stairs to enter Entrance Stairs-Rails: None Entrance Stairs-Number of Steps: 2 no railings Home Layout: One level Home Equipment: Walker - 4 wheels;Other (comment) (lift chair) Additional Comments: no falls in the past six months    Prior Function Level of Independence: Independent               Hand Dominance        Extremity/Trunk Assessment   Upper Extremity Assessment Upper Extremity Assessment: Overall WFL for tasks assessed    Lower Extremity Assessment Lower Extremity Assessment: Overall WFL for tasks assessed    Cervical / Trunk Assessment Cervical / Trunk Assessment: Normal  Communication   Communication: No difficulties  Cognition Arousal/Alertness: Awake/alert Behavior During Therapy: WFL for tasks assessed/performed Overall Cognitive Status: Within Functional Limits for tasks assessed                                        General Comments  Exercises     Assessment/Plan    PT Assessment Patient needs continued PT services  PT Problem List Decreased strength       PT Treatment Interventions DME instruction;Balance training;Gait training;Stair training;Functional mobility training;Patient/family education;Therapeutic activities;Therapeutic exercise    PT Goals (Current goals can be found in the Care Plan section)  Acute Rehab PT Goals Patient Stated Goal: go home PT Goal Formulation: With patient Time For Goal Achievement: 10/01/20 Potential to Achieve Goals: Good    Frequency Min 1X/week   Barriers to  discharge        Co-evaluation               AM-PAC PT "6 Clicks" Mobility  Outcome Measure Help needed turning from your back to your side while in a flat bed without using bedrails?: None Help needed moving from lying on your back to sitting on the side of a flat bed without using bedrails?: None Help needed moving to and from a bed to a chair (including a wheelchair)?: None Help needed standing up from a chair using your arms (e.g., wheelchair or bedside chair)?: None Help needed to walk in hospital room?: None Help needed climbing 3-5 steps with a railing? : None 6 Click Score: 24    End of Session   Activity Tolerance: Patient tolerated treatment well Patient left: in chair;with call bell/phone within reach;with family/visitor present Nurse Communication: Mobility status PT Visit Diagnosis: Muscle weakness (generalized) (M62.81)    Time: 1249-1300 PT Time Calculation (min) (ACUTE ONLY): 11 min   Charges:   PT Evaluation $PT Eval Moderate Complexity: 1 Mod          Windell Norfolk, DPT, PN1   Supplemental Physical Therapist [place]    Pager [phone] Acute Rehab Office [phone]

## 2020-09-17 NOTE — Plan of Care (Signed)
VSS. NG tube in placed. TPN infusing. No complaints overnight. Tolerating ice chips. Call bell within reach.  Problem: Education: Goal: Knowledge of General Education information will improve Description: Including pain rating scale, medication(s)/side effects and non-pharmacologic comfort measures Outcome: Progressing   Problem: Health Behavior/Discharge Planning: Goal: Ability to manage health-related needs will improve Outcome: Progressing   Problem: Clinical Measurements: Goal: Ability to maintain clinical measurements within normal limits will improve Outcome: Progressing Goal: Will remain free from infection Outcome: Progressing Goal: Diagnostic test results will improve Outcome: Progressing Goal: Respiratory complications will improve Outcome: Progressing Goal: Cardiovascular complication will be avoided Outcome: Progressing   Problem: Activity: Goal: Risk for activity intolerance will decrease Outcome: Progressing   Problem: Nutrition: Goal: Adequate nutrition will be maintained Outcome: Progressing   Problem: Coping: Goal: Level of anxiety will decrease Outcome: Progressing   Problem: Elimination: Goal: Will not experience complications related to bowel motility Outcome: Progressing Goal: Will not experience complications related to urinary retention Outcome: Progressing   Problem: Pain Managment: Goal: General experience of comfort will improve Outcome: Progressing   Problem: Safety: Goal: Ability to remain free from injury will improve Outcome: Progressing   Problem: Skin Integrity: Goal: Risk for impaired skin integrity will decrease Outcome: Progressing

## 2020-09-18 DIAGNOSIS — K311 Adult hypertrophic pyloric stenosis: Secondary | ICD-10-CM

## 2020-09-18 DIAGNOSIS — C169 Malignant neoplasm of stomach, unspecified: Secondary | ICD-10-CM | POA: Diagnosis not present

## 2020-09-18 DIAGNOSIS — I10 Essential (primary) hypertension: Secondary | ICD-10-CM | POA: Diagnosis not present

## 2020-09-18 LAB — CBC WITH DIFFERENTIAL/PLATELET
Abs Immature Granulocytes: 0.2 10*3/uL — ABNORMAL HIGH (ref 0.00–0.07)
Basophils Absolute: 0.1 10*3/uL (ref 0.0–0.1)
Basophils Relative: 1 %
Eosinophils Absolute: 0.6 10*3/uL — ABNORMAL HIGH (ref 0.0–0.5)
Eosinophils Relative: 4 %
HCT: 35.4 % — ABNORMAL LOW (ref 39.0–52.0)
Hemoglobin: 11.7 g/dL — ABNORMAL LOW (ref 13.0–17.0)
Immature Granulocytes: 2 %
Lymphocytes Relative: 29 %
Lymphs Abs: 3.9 10*3/uL (ref 0.7–4.0)
MCH: 30.1 pg (ref 26.0–34.0)
MCHC: 33.1 g/dL (ref 30.0–36.0)
MCV: 91 fL (ref 80.0–100.0)
Monocytes Absolute: 1.4 10*3/uL — ABNORMAL HIGH (ref 0.1–1.0)
Monocytes Relative: 11 %
Neutro Abs: 7.1 10*3/uL (ref 1.7–7.7)
Neutrophils Relative %: 53 %
Platelets: 331 10*3/uL (ref 150–400)
RBC: 3.89 MIL/uL — ABNORMAL LOW (ref 4.22–5.81)
RDW: 14.4 % (ref 11.5–15.5)
WBC: 13.3 10*3/uL — ABNORMAL HIGH (ref 4.0–10.5)
nRBC: 0 % (ref 0.0–0.2)

## 2020-09-18 LAB — COMPREHENSIVE METABOLIC PANEL
ALT: 44 U/L (ref 0–44)
AST: 33 U/L (ref 15–41)
Albumin: 2.8 g/dL — ABNORMAL LOW (ref 3.5–5.0)
Alkaline Phosphatase: 62 U/L (ref 38–126)
Anion gap: 8 (ref 5–15)
BUN: 26 mg/dL — ABNORMAL HIGH (ref 8–23)
CO2: 27 mmol/L (ref 22–32)
Calcium: 9.1 mg/dL (ref 8.9–10.3)
Chloride: 100 mmol/L (ref 98–111)
Creatinine, Ser: 0.88 mg/dL (ref 0.61–1.24)
GFR, Estimated: >60 mL/min (ref >60)
Glucose, Bld: 141 mg/dL — ABNORMAL HIGH (ref 70–99)
Potassium: 4.3 mmol/L (ref 3.5–5.1)
Sodium: 135 mmol/L (ref 135–145)
Total Bilirubin: 0.5 mg/dL (ref 0.3–1.2)
Total Protein: 6.2 g/dL — ABNORMAL LOW (ref 6.5–8.1)

## 2020-09-18 LAB — BRAIN NATRIURETIC PEPTIDE: B Natriuretic Peptide: 17.9 pg/mL (ref 0.0–100.0)

## 2020-09-18 LAB — MAGNESIUM: Magnesium: 2.1 mg/dL (ref 1.7–2.4)

## 2020-09-18 MED ORDER — ARTIFICIAL TEARS OPHTHALMIC OINT
TOPICAL_OINTMENT | OPHTHALMIC | Status: DC | PRN
  Filled 2020-09-18: qty 3.5

## 2020-09-18 MED ORDER — TRAVASOL 10 % IV SOLN
INTRAVENOUS | Status: AC
  Filled 2020-09-18: qty 972

## 2020-09-18 NOTE — Progress Notes (Signed)
   Progress Note     Subjective: No acute complaints.   Objective: Vital signs in last 24 hours: Temp:  [97.6 F (36.4 C)-98.4 F (36.9 C)] 98 F (36.7 C) (05/04 0408) Pulse Rate:  [82-96] 84 (05/04 0408) Resp:  [17-19] 17 (05/04 0408) BP: (124-154)/(80-95) 138/87 (05/04 0408) SpO2:  [93 %-95 %] 93 % (05/04 0408) Last BM Date: 09/15/20  Intake/Output from previous day: 05/03 0701 - 05/04 0700 In: 1621.9 [P.O.:50; I.V.:1571.9] Out: 1750 [Urine:1150; Emesis/NG output:600] Intake/Output this shift: No intake/output data recorded.  PE: General: pleasant, WD,overweightmale who issitting in chair HEENT: head is normocephalic, atraumatic. Sclera are noninjected. Ears and nose without any masses or lesions. Mouth is pink and moist Heart: regular, rate, and rhythm.  Lungs: Respiratory effort nonlabored Abd: soft, NT, ND, +BS, no masses, hernias, or organomegaly, NGT with minimal thin yellowish drainage MS: all 4 extremities are symmetrical with no cyanosis, clubbing, or edema. Skin: warm and dry with no masses, lesions, or rashes Neuro: Cranial nerves 2-12 grossly intact, sensation is normal throughout Psych: A&Ox3 with an appropriate affect   Lab Results:  Recent Labs    09/17/20 0342 09/18/20 0207  WBC 13.1* 13.3*  HGB 12.1* 11.7*  HCT 33.6* 35.4*  PLT 335 331   BMET Recent Labs    09/17/20 0342 09/18/20 0207  NA 134* 135  K 4.4 4.3  CL 100 100  CO2 26 27  GLUCOSE 137* 141*  BUN 23 26*  CREATININE 0.85 0.88  CALCIUM 9.1 9.1   PT/INR No results for input(s): LABPROT, INR in the last 72 hours. CMP     Component Value Date/Time   NA 135 09/18/2020 0207   K 4.3 09/18/2020 0207   CL 100 09/18/2020 0207   CO2 27 09/18/2020 0207   GLUCOSE 141 (H) 09/18/2020 0207   BUN 26 (H) 09/18/2020 0207   CREATININE 0.88 09/18/2020 0207   CALCIUM 9.1 09/18/2020 0207   PROT 6.2 (L) 09/18/2020 0207   ALBUMIN 2.8 (L) 09/18/2020 0207   AST 33 09/18/2020 0207    ALT 44 09/18/2020 0207   ALKPHOS 62 09/18/2020 0207   BILITOT 0.5 09/18/2020 0207   GFRNONAA >60 09/18/2020 0207   Lipase  No results found for: LIPASE     Studies/Results: DG Abd 1 View  Result Date: 09/17/2020 CLINICAL DATA:  Status post nasogastric tube placement EXAM: ABDOMEN - 1 VIEW COMPARISON:  September 12, 2020 FINDINGS: Nasogastric tube with tip and side port overlying the stomach. The bowel gas pattern is normal. No radio-opaque calculi or other significant radiographic abnormality are seen. IMPRESSION: Nasogastric tube with tip and side port overlying the stomach. Electronically Signed   By: Dahlia Bailiff MD   On: 09/17/2020 13:07    Anti-infectives: Anti-infectives (From admission, onward)   None       Assessment/Plan GOO secondary to gastric adenocarcinoma in antrum - PICC and TPN - NGT decompression - ok to have ice chips and sips for comfort -prealbumin 7.3>9.5 - continue TPN to improve nutrition and conditioning prior to surgery. Recheck prealbumin again tomorrow AM   Most likely will plan surgery early next week at [facility] with Dr. Barry Dienes. Patient could transfer over at the end of this week or weekend and stay there for surgical care.   LOS: 6 days    Norm Parcel, [facility] 09/18/2020, 8:45 AM Please see Amion for pager number during day hours 7:00am-4:30pm

## 2020-09-18 NOTE — Progress Notes (Signed)
PHARMACY - TOTAL PARENTERAL NUTRITION CONSULT NOTE  Indication: Gastric outlet obstruction  Patient Measurements: Height: 5\' 2"  (157.5 cm) Weight: 77.2 kg (170 lb 3.1 oz) IBW/kg (Calculated) : 54.6 TPN AdjBW (KG): 60.2 Body mass index is 31.13 kg/m. Usual Weight: ~75kg per patient   Assessment:  65 yo M with PMH significant for GERD and now newly diagnosed gastric malignancy with planned bowel resection. Patient reports a normal diet (3 meals/day) PTA with no dietary restrictions and no unusual weight changes recently. He stopped eating regularly a few days PTA.  Pharmacy consulted to manage TPN.  Glucose / Insulin: no hx DM. CBGs controlled. SSI d/c'd 5/3 Electrolytes: all stable WNL  Renal: SCr < 1 stable, BUN up to 26 Hepatic: LFTs / Tbili / TG WNL, albumin 2.8, prealbumin improved to 9.5 Intake / Output; MIVF: UOP 0.6 ml/kg/hr, NG 443mL, I/O's even, LBM 5/1 GI Imaging: 4/28 Abx Xray: enteric tube placed in stomach GI Surgeries / Procedures: SBR pending  Central access: PICC 09/13/20 TPN start date: 09/13/20  Nutritional Goals: (per RD recommendation 4/29) 1650-1850 kCal, 90-105g AA, > 1.6L fluid per day  Current Nutrition:  TPN; NPO  Plan:  Continue TPN at goal rate 75 ml/hr - will provide 97g AA and 1706 kCal, meeting 100% of needs Electrolytes in TPN: continue same today - Na 178mEq/L, K 11mEq/L, Ca 48mEq/L, Mg 51mEq/L, Phos 55mmol/L, Cl:Ac 1:1 Add standard MVI and trace elements to TPN D/C SSI/CBG checks 5/3. Consider restarting post-surgery pending glucose on AM labs. Monitor standard TPN labs on Mon/Thurs  F/u Surgery plans - postponing resection until nutritional status improves, most likely early next week   Arturo Morton, PharmD, BCPS Please check AMION for all [facility] contact numbers Clinical Pharmacist 09/18/2020 8:19 AM

## 2020-09-18 NOTE — Progress Notes (Signed)
Nutrition Follow-up  DOCUMENTATION CODES:   Obesity unspecified  INTERVENTION:    Continue TPN dosing per Pharmacy.  Re-estimated nutrition needs: 1900-2100 kcal, 100-120 gm protein per day.  NUTRITION DIAGNOSIS:   Increased nutrient needs related to chronic illness (gastric cancer) as evidenced by estimated needs.  Ongoing   GOAL:   Patient will meet greater than or equal to 90% of their needs  Met with TPN  MONITOR:   Diet advancement,Labs,Weight trends,Skin,I & O's  REASON FOR ASSESSMENT:   Consult New TPN/TNA  ASSESSMENT:   Alexander [name] is a 66 y.o. male with medical history significant for COPD, HTN, HLD, GERD who was admitted from [facility] for management of newly diagnosed gastric malignancy with gastric outlet obstruction, he had work-up at [facility] which included EGD and biopsy showing gastric adenocarcinoma, NG tube was placed and he was transferred to [facility] for further treatment.  Receiving TPN via PICC at 75 ml/h to provide 1706 kcal, 97 gm protein per day.  NG tube in place to decompression. Plans for transfer to [facility] for surgery next week. Awaiting improvement in prealbumin level before undergoing surgery for GOO.   Labs reviewed. Prealbumin 9.5 (5/2) CBG: no longer checking  Medications reviewed.  NG tube output 600 ml x 24 hours  No new weight available since admission, need to monitor weight trends.   Patient with increased nutrition needs for upcoming surgery and cancer treatment. See increased calorie goal for TPN below.   Diet Order:   Diet Order            Diet NPO time specified  Diet effective now                 EDUCATION NEEDS:   Education needs have been addressed  Skin:  Skin Assessment: Reviewed RN Assessment  Last BM:  5/1 type 3  Height:   Ht Readings from Last 1 Encounters:  09/13/20 '5\' 2"'  (1.575 m)    Weight:   Wt Readings from Last 1 Encounters:  09/13/20 77.2 kg    Ideal  Body Weight:  53.6 kg  BMI:  Body mass index is 31.13 kg/m.  Estimated Nutritional Needs:   Kcal:  1900-2100  Protein:  100-120 gm  Fluid:  > 1.9 L    Lucas Mallow, RD, LDN, CNSC Please refer to Amion for contact information.

## 2020-09-18 NOTE — Progress Notes (Signed)
PROGRESS NOTE                                                                                                                                                                                                             Patient Demographics:    Alexander [name], is a 65 y.o. male, DOB - [birth year], [id number]  Outpatient Primary MD for the patient is Serita Grammes, MD    LOS - 6  Admit date - 09/12/2020    No chief complaint on file.      Brief Narrative (HPI from H&P)   - Alexander [name] is a 65 y.o. male with medical history significant for COPD, HTN, HLD, GERD who was admitted from [facility] for management of newly diagnosed gastric malignancy with gastric outlet obstruction, he had work-up at [facility] which included EGD and biopsy showing gastric adenocarcinoma, NG tube was placed and he was transferred to [facility] for further treatment.   Subjective:   Patient in bed, denies any nausea, vomiting, no fever, no chills, no shortness of breath.     Assessment  & Plan :   1.  Gastric outlet obstruction caused by gastric adenocarcinoma as diagnosed with biopsy the hospital. -Management per general surgery, keep n.p.o., continue with NG tube for decompression, continue with TPN via PICC line to optimize nutritional status in anticipation for surgery, per general surgery plan for surgery early next week at [facility] with Dr. Barry Dienes, will transfer there over the weekend in anticipation for surgery. - seen by Oncologist Dr Marin Olp as well.  2. HTN.  Catapres patch dose increased 09/14/20, increased Nitropaste on 09/16/20, on IV labetalol &  Hydralazine as needed.  3. Upper GI bleed caused by gastric ulcer at [facility]  Resolved after IV PPI.  Type screen and monitor.   4. Hypokalemia - likely due to ongoing NG losses, replaced and stable.       Condition - Extremely  Guarded  Family Communication  : None at bedside  Code Status :  Full  Consults  :  CCS, Oncology  PUD Prophylaxis : PPI   Procedures  :      R. Arm PICC 09/13/20      Disposition Plan  :    Status is: Inpatient  Remains inpatient appropriate because:IV treatments appropriate due to  intensity of illness or inability to take PO   Dispo: The patient is from: Home              Anticipated d/c is to: Home              Patient currently is not medically stable to d/c.   Difficult to place patient No   DVT Prophylaxis  :    heparin injection 5,000 Units Start: 09/13/20 1400 SCDs Start: 09/12/20 2127    Lab Results  Component Value Date   PLT 331 09/18/2020    Diet :  Diet Order            Diet NPO time specified  Diet effective now                  Inpatient Medications  Scheduled Meds:  . Chlorhexidine Gluconate Cloth  6 each Topical Daily  . [START ON 09/20/2020] cloNIDine  0.3 mg Transdermal Q Fri  . heparin injection (subcutaneous)  5,000 Units Subcutaneous Q8H  . nitroGLYCERIN  1 inch Topical Q6H  . pantoprazole (PROTONIX) IV  40 mg Intravenous Q12H  . pneumococcal 23 valent vaccine  0.5 mL Intramuscular Tomorrow-1000   Continuous Infusions:  . TPN ADULT (ION) 75 mL/hr at 09/17/20 1745  . TPN ADULT (ION)     PRN Meds:.acetaminophen **OR** acetaminophen, artificial tears, hydrALAZINE, labetalol, [DISCONTINUED] ondansetron **OR** ondansetron (ZOFRAN) IV, sodium chloride flush  Antibiotics  :    Anti-infectives (From admission, onward)   None        Phillips Climes M.D on 09/18/2020 at 3:13 PM  To page go to [email or web address]   [facility] -  Office  [phone]    See all Orders from today for further details    Objective:   Vitals:   09/17/20 2308 09/17/20 2344 09/18/20 0408 09/18/20 1428  BP: (!) 138/94 (!) 143/86 138/87 (!) 151/87  Pulse:  82 84 81  Resp:  19 17 18   Temp:  98.4 F (36.9 C) 98 F (36.7 C) 97.8 F (36.6 C)   TempSrc:  Oral Oral Oral  SpO2:  94% 93% 96%  Weight:      Height:        Wt Readings from Last 3 Encounters:  09/13/20 77.2 kg  08/20/20 81.4 kg     Intake/Output Summary (Last 24 hours) at 09/18/2020 1513 Last data filed at 09/18/2020 1400 Gross per 24 hour  Intake 1681.88 ml  Output 2075 ml  Net -393.12 ml     Physical Exam  Awake Alert, Oriented X 3, No new F.N deficits, Normal affect, NG tube in place. Symmetrical Chest wall movement, Good air movement bilaterally, CTAB RRR,No Gallops,Rubs or new Murmurs, No Parasternal Heave +ve B.Sounds, Abd Soft, No tenderness, No rebound - guarding or rigidity. No Cyanosis, Clubbing or edema, No new Rash or bruise       Data Review:    CBC Recent Labs  Lab 09/14/20 0315 09/15/20 0512 09/16/20 0433 09/17/20 0342 09/18/20 0207  WBC 13.2* 13.8* 11.6* 13.1* 13.3*  HGB 11.3* 11.8* 11.7* 12.1* 11.7*  HCT 33.1* 34.3* 32.7* 33.6* 35.4*  PLT 330 345 342 335 331  MCV 91.2 92.2 93.7 93.1 91.0  MCH 31.1 31.7 33.5 33.5 30.1  MCHC 34.1 34.4 35.8 36.0 33.1  RDW 14.5 14.2 14.1 14.1 14.4  LYMPHSABS 2.6 2.4 2.6 2.9 3.9  MONOABS 0.9 1.5* 1.5* 1.7* 1.4*  EOSABS 0.5 0.5 0.5 0.6* 0.6*  BASOSABS 0.1 0.1 0.1 0.1  0.1    Recent Labs  Lab 09/13/20 [phone] 09/13/20 7026 09/14/20 0315 09/15/20 0512 09/16/20 0433 09/17/20 0342 09/18/20 0207  NA  --   --  137 138 136 134* 135  K  --   --  2.9* 3.3* 4.1 4.4 4.3  CL  --   --  100 100 99 100 100  CO2  --   --  27 29 28 26 27   GLUCOSE  --   --  139* 163* 130* 137* 141*  BUN  --   --  11 15 19 23  26*  CREATININE  --   --  0.79 0.83 0.77 0.85 0.88  CALCIUM  --   --  8.8* 8.9 9.1 9.1 9.1  AST  --   --  26 26 28 29  33  ALT  --   --  32 33 38 41 44  ALKPHOS  --   --  62 61 57 59 62  BILITOT  --   --  0.8 0.6 0.3 0.5 0.5  ALBUMIN  --   --  3.1* 3.0* 2.9* 2.8* 2.8*  MG  --   --  2.0 2.0 2.0 2.1 2.1  INR 1.0  --   --   --   --   --   --   HGBA1C  --  5.6  --   --   --   --   --   BNP  --    --  86.4 50.5 28.8 19.9 17.9    ------------------------------------------------------------------------------------------------------------------ Recent Labs    09/16/20 0433  TRIG 100    Lab Results  Component Value Date   HGBA1C 5.6 09/13/2020   ------------------------------------------------------------------------------------------------------------------ No results for input(s): TSH, T4TOTAL, T3FREE, THYROIDAB in the last 72 hours.  Invalid input(s): FREET3  Cardiac Enzymes No results for input(s): CKMB, TROPONINI, MYOGLOBIN in the last 168 hours.  Invalid input(s): CK ------------------------------------------------------------------------------------------------------------------    Component Value Date/Time   BNP 17.9 09/18/2020 0207    Micro Results Recent Results (from the past 240 hour(s))  SARS CORONAVIRUS 2 (TAT 6-24 HRS) Nasopharyngeal Nasopharyngeal Swab     Status: None   Collection Time: 09/13/20  5:20 AM   Specimen: Nasopharyngeal Swab  Result Value Ref Range Status   SARS Coronavirus 2 NEGATIVE NEGATIVE Final    Comment: (NOTE) SARS-CoV-2 target nucleic acids are NOT DETECTED.  The SARS-CoV-2 RNA is generally detectable in upper and lower respiratory specimens during the acute phase of infection. Negative results do not preclude SARS-CoV-2 infection, do not rule out co-infections with other pathogens, and should not be used as the sole basis for treatment or other patient management decisions. Negative results must be combined with clinical observations, patient history, and epidemiological information. The expected result is Negative.  Fact Sheet for Patients: SugarRoll.be  Fact Sheet for Healthcare Providers: https://www.woods-mathews.com/  This test is not yet approved or cleared by the [place] FDA and  has been authorized for detection and/or diagnosis of SARS-CoV-2 by FDA under an Emergency  Use Authorization (EUA). This EUA will remain  in effect (meaning this test can be used) for the duration of the COVID-19 declaration under Se ction 564(b)(1) of the Act, 21 U.S.C. section 360bbb-3(b)(1), unless the authorization is terminated or revoked sooner.  Performed at [facility], [place] [address], [place], [place] [address]     Radiology Reports DG Abd 1 View  Result Date: 09/17/2020 CLINICAL DATA:  Status post nasogastric tube placement EXAM: ABDOMEN -  1 VIEW COMPARISON:  September 12, 2020 FINDINGS: Nasogastric tube with tip and side port overlying the stomach. The bowel gas pattern is normal. No radio-opaque calculi or other significant radiographic abnormality are seen. IMPRESSION: Nasogastric tube with tip and side port overlying the stomach. Electronically Signed   By: Dahlia Bailiff MD   On: 09/17/2020 13:07   DG Chest Port 1 View  Result Date: 09/13/2020 CLINICAL DATA:  Cough and shortness of breath EXAM: PORTABLE CHEST 1 VIEW COMPARISON:  Chest CT September 05, 2020 FINDINGS: Nasogastric tube tip and side port in stomach. No edema or airspace opacity. Heart size and pulmonary vascularity are normal. No adenopathy. No bone lesions. IMPRESSION: Nasogastric tube tip and side port in stomach. Lungs clear. Cardiac silhouette within normal limits. Electronically Signed   By: Lowella Grip [name] M.D.   On: 09/13/2020 08:00   DG Abd Portable 1V  Result Date: 09/12/2020 CLINICAL DATA:  NG tube in place.  Gastric outlet obstruction. EXAM: PORTABLE ABDOMEN - 1 VIEW COMPARISON:  Abdominal CTA 09/05/2020 FINDINGS: Tip and side port of the enteric tube are below the diaphragm in the stomach, enteric tube is coiled within the gastric body. Decreased gastric distension from prior CT. Generalized paucity of small bowel gas. Residual high-density contrast throughout the colon. Vascular calcifications are seen. IMPRESSION: Tip and side port of the enteric tube below the diaphragm in the stomach,  enteric tube coiled within the gastric body. Electronically Signed   By: Keith Rake M.D.   On: 09/12/2020 23:28   [place] EKG SITE RITE  Result Date: 09/13/2020 If Site Rite image not attached, placement could not be confirmed due to current cardiac rhythm.

## 2020-09-19 DIAGNOSIS — I10 Essential (primary) hypertension: Secondary | ICD-10-CM | POA: Diagnosis not present

## 2020-09-19 DIAGNOSIS — C169 Malignant neoplasm of stomach, unspecified: Secondary | ICD-10-CM | POA: Diagnosis not present

## 2020-09-19 DIAGNOSIS — K311 Adult hypertrophic pyloric stenosis: Secondary | ICD-10-CM | POA: Diagnosis not present

## 2020-09-19 LAB — COMPREHENSIVE METABOLIC PANEL
ALT: 54 U/L — ABNORMAL HIGH (ref 0–44)
AST: 39 U/L (ref 15–41)
Albumin: 2.8 g/dL — ABNORMAL LOW (ref 3.5–5.0)
Alkaline Phosphatase: 63 U/L (ref 38–126)
Anion gap: 6 (ref 5–15)
BUN: 27 mg/dL — ABNORMAL HIGH (ref 8–23)
CO2: 30 mmol/L (ref 22–32)
Calcium: 9.1 mg/dL (ref 8.9–10.3)
Chloride: 101 mmol/L (ref 98–111)
Creatinine, Ser: 0.91 mg/dL (ref 0.61–1.24)
GFR, Estimated: >60 mL/min (ref >60)
Glucose, Bld: 125 mg/dL — ABNORMAL HIGH (ref 70–99)
Potassium: 4.4 mmol/L (ref 3.5–5.1)
Sodium: 137 mmol/L (ref 135–145)
Total Bilirubin: 0.5 mg/dL (ref 0.3–1.2)
Total Protein: 6.1 g/dL — ABNORMAL LOW (ref 6.5–8.1)

## 2020-09-19 LAB — MAGNESIUM: Magnesium: 2.1 mg/dL (ref 1.7–2.4)

## 2020-09-19 LAB — CBC WITH DIFFERENTIAL/PLATELET
Abs Immature Granulocytes: 0.17 10*3/uL — ABNORMAL HIGH (ref 0.00–0.07)
Basophils Absolute: 0.1 10*3/uL (ref 0.0–0.1)
Basophils Relative: 1 %
Eosinophils Absolute: 0.6 10*3/uL — ABNORMAL HIGH (ref 0.0–0.5)
Eosinophils Relative: 4 %
HCT: 35.2 % — ABNORMAL LOW (ref 39.0–52.0)
Hemoglobin: 11.2 g/dL — ABNORMAL LOW (ref 13.0–17.0)
Immature Granulocytes: 1 %
Lymphocytes Relative: 28 %
Lymphs Abs: 3.7 10*3/uL (ref 0.7–4.0)
MCH: 30.3 pg (ref 26.0–34.0)
MCHC: 31.8 g/dL (ref 30.0–36.0)
MCV: 95.1 fL (ref 80.0–100.0)
Monocytes Absolute: 1.5 10*3/uL — ABNORMAL HIGH (ref 0.1–1.0)
Monocytes Relative: 11 %
Neutro Abs: 7.4 10*3/uL (ref 1.7–7.7)
Neutrophils Relative %: 55 %
Platelets: 309 10*3/uL (ref 150–400)
RBC: 3.7 MIL/uL — ABNORMAL LOW (ref 4.22–5.81)
RDW: 14.4 % (ref 11.5–15.5)
WBC: 13.4 10*3/uL — ABNORMAL HIGH (ref 4.0–10.5)
nRBC: 0 % (ref 0.0–0.2)

## 2020-09-19 LAB — PHOSPHORUS: Phosphorus: 4.3 mg/dL (ref 2.5–4.6)

## 2020-09-19 LAB — BRAIN NATRIURETIC PEPTIDE: B Natriuretic Peptide: 15 pg/mL (ref 0.0–100.0)

## 2020-09-19 LAB — PREALBUMIN: Prealbumin: 14.2 mg/dL — ABNORMAL LOW (ref 18–38)

## 2020-09-19 MED ORDER — TRAVASOL 10 % IV SOLN
INTRAVENOUS | Status: AC
  Filled 2020-09-19: qty 1017.6

## 2020-09-19 MED ORDER — CEFAZOLIN SODIUM-DEXTROSE 2-4 GM/100ML-% IV SOLN
2.0000 g | INTRAVENOUS | Status: AC
  Administered 2020-09-20: 2 g via INTRAVENOUS
  Filled 2020-09-19: qty 100

## 2020-09-19 NOTE — Progress Notes (Signed)
PROGRESS NOTE                                                                                                                                                                                                             Patient Demographics:    Alexander [name], is a 65 y.o. male, DOB - 18-Jan-1956, [id number]  Outpatient Primary MD for the patient is Serita Grammes, MD    LOS - 7  Admit date - 09/12/2020    No chief complaint on file.      Brief Narrative (HPI from H&P)   - Alexander [name] is a 65 y.o. male with medical history significant for COPD, HTN, HLD, GERD who was admitted from [facility] for management of newly diagnosed gastric malignancy with gastric outlet obstruction, he had work-up at [facility] which included EGD and biopsy showing gastric adenocarcinoma, NG tube was placed and he was transferred to [facility] for further treatment.   Subjective:   Patient in bed, reports an episode of epigastric bloating, resolved, no nausea, no vomiting, last bowel movement 2 to 3 days ago. .   Assessment  & Plan :   1.  Gastric outlet obstruction caused by gastric adenocarcinoma as diagnosed with biopsy the hospital. -Management per general surgery, keep n.p.o., continue with NG tube for decompression, continue with TPN via PICC line to optimize nutritional status in anticipation for surgery, prealbumin significantly improved 7> 14, plan to go to OR tomorrow afternoon at [facility] per general surgery. - seen by Oncologist Dr Marin Olp as well.  2. HTN.  Blood pressure acceptable on Catapres patch and Nitropaste, continue with as needed IV labetalol and hydralazine.  3. Upper GI bleed caused by gastric ulcer at [facility]  Resolved after IV PPI.  Type screen and monitor.   4. Hypokalemia - likely due to ongoing NG losses, replaced and stable.       Condition - Extremely  Guarded  Family Communication  : None at bedside  Code Status :  Full  Consults  :  CCS, Oncology  PUD Prophylaxis : PPI   Procedures  :      R. Arm PICC 09/13/20      Disposition Plan  :    Status is: Inpatient  Remains inpatient appropriate because:IV treatments appropriate due to intensity of  illness or inability to take PO   Dispo: The patient is from: Home              Anticipated d/c is to: Home              Patient currently is not medically stable to d/c.   Difficult to place patient No   DVT Prophylaxis  :    heparin injection 5,000 Units Start: 09/13/20 1400 SCDs Start: 09/12/20 2127    Lab Results  Component Value Date   PLT 309 09/19/2020    Diet :  Diet Order            Diet NPO time specified  Diet effective now                  Inpatient Medications  Scheduled Meds:  . Chlorhexidine Gluconate Cloth  6 each Topical Daily  . [START ON 09/20/2020] cloNIDine  0.3 mg Transdermal Q Fri  . heparin injection (subcutaneous)  5,000 Units Subcutaneous Q8H  . nitroGLYCERIN  1 inch Topical Q6H  . pantoprazole (PROTONIX) IV  40 mg Intravenous Q12H  . pneumococcal 23 valent vaccine  0.5 mL Intramuscular Tomorrow-1000   Continuous Infusions:  . [START ON 09/20/2020]  ceFAZolin (ANCEF) IV    . TPN ADULT (ION) 75 mL/hr at 09/18/20 1723  . TPN ADULT (ION)     PRN Meds:.acetaminophen **OR** acetaminophen, artificial tears, hydrALAZINE, labetalol, [DISCONTINUED] ondansetron **OR** ondansetron (ZOFRAN) IV, sodium chloride flush  Antibiotics  :    Anti-infectives (From admission, onward)   Start     Dose/Rate Route Frequency Ordered Stop   09/20/20 0600  ceFAZolin (ANCEF) IVPB 2g/100 mL premix        2 g 200 mL/hr over 30 Minutes Intravenous On call to O.R. 09/19/20 0940 09/21/20 0559        Saben Donigan M.D on 09/19/2020 at 2:00 PM  To page go to [email or web address]   [facility] -  Office  [phone]    See all Orders from today for  further details    Objective:   Vitals:   09/18/20 1428 09/18/20 2123 09/19/20 0033 09/19/20 0446  BP: (!) 151/87 (!) 150/94 (!) 145/86 (!) 122/92  Pulse: 81 74 76 80  Resp: 18 20  20   Temp: 97.8 F (36.6 C) 98.1 F (36.7 C)  98.2 F (36.8 C)  TempSrc: Oral   Oral  SpO2: 96% 96%    Weight:      Height:        Wt Readings from Last 3 Encounters:  09/13/20 77.2 kg  08/20/20 81.4 kg     Intake/Output Summary (Last 24 hours) at 09/19/2020 1400 Last data filed at 09/19/2020 1300 Gross per 24 hour  Intake 1847.04 ml  Output 1025 ml  Net 822.04 ml     Physical Exam  Awake Alert, Oriented X 3, No new F.N deficits, Normal affect, NGT in place Symmetrical Chest wall movement, Good air movement bilaterally, CTAB RRR,No Gallops,Rubs or new Murmurs, No Parasternal Heave +ve B.Sounds, Abd Soft, No tenderness, No rebound - guarding or rigidity. No Cyanosis, Clubbing or edema, No new Rash or bruise        Data Review:    CBC Recent Labs  Lab 09/15/20 0512 09/16/20 0433 09/17/20 0342 09/18/20 0207 09/19/20 0456  WBC 13.8* 11.6* 13.1* 13.3* 13.4*  HGB 11.8* 11.7* 12.1* 11.7* 11.2*  HCT 34.3* 32.7* 33.6* 35.4* 35.2*  PLT 345 342 335 331 309  MCV  92.2 93.7 93.1 91.0 95.1  MCH 31.7 33.5 33.5 30.1 30.3  MCHC 34.4 35.8 36.0 33.1 31.8  RDW 14.2 14.1 14.1 14.4 14.4  LYMPHSABS 2.4 2.6 2.9 3.9 3.7  MONOABS 1.5* 1.5* 1.7* 1.4* 1.5*  EOSABS 0.5 0.5 0.6* 0.6* 0.6*  BASOSABS 0.1 0.1 0.1 0.1 0.1    Recent Labs  Lab 09/13/20 0737 09/13/20 0943 09/14/20 0315 09/15/20 0512 09/16/20 0433 09/17/20 0342 09/18/20 0207 09/19/20 0456  NA  --   --    < > 138 136 134* 135 137  K  --   --    < > 3.3* 4.1 4.4 4.3 4.4  CL  --   --    < > 100 99 100 100 101  CO2  --   --    < > 29 28 26 27 30   GLUCOSE  --   --    < > 163* 130* 137* 141* 125*  BUN  --   --    < > 15 19 23  26* 27*  CREATININE  --   --    < > 0.83 0.77 0.85 0.88 0.91  CALCIUM  --   --    < > 8.9 9.1 9.1 9.1 9.1   AST  --   --    < > 26 28 29  33 39  ALT  --   --    < > 33 38 41 44 54*  ALKPHOS  --   --    < > 61 57 59 62 63  BILITOT  --   --    < > 0.6 0.3 0.5 0.5 0.5  ALBUMIN  --   --    < > 3.0* 2.9* 2.8* 2.8* 2.8*  MG  --   --    < > 2.0 2.0 2.1 2.1 2.1  INR 1.0  --   --   --   --   --   --   --   HGBA1C  --  5.6  --   --   --   --   --   --   BNP  --   --    < > 50.5 28.8 19.9 17.9 15.0   < > = values in this interval not displayed.    ------------------------------------------------------------------------------------------------------------------ No results for input(s): CHOL, HDL, LDLCALC, TRIG, CHOLHDL, LDLDIRECT in the last 72 hours.  Lab Results  Component Value Date   HGBA1C 5.6 09/13/2020   ------------------------------------------------------------------------------------------------------------------ No results for input(s): TSH, T4TOTAL, T3FREE, THYROIDAB in the last 72 hours.  Invalid input(s): FREET3  Cardiac Enzymes No results for input(s): CKMB, TROPONINI, MYOGLOBIN in the last 168 hours.  Invalid input(s): CK ------------------------------------------------------------------------------------------------------------------    Component Value Date/Time   BNP 15.0 09/19/2020 0456    Micro Results Recent Results (from the past 240 hour(s))  SARS CORONAVIRUS 2 (TAT 6-24 HRS) Nasopharyngeal Nasopharyngeal Swab     Status: None   Collection Time: 09/13/20  5:20 AM   Specimen: Nasopharyngeal Swab  Result Value Ref Range Status   SARS Coronavirus 2 NEGATIVE NEGATIVE Final    Comment: (NOTE) SARS-CoV-2 target nucleic acids are NOT DETECTED.  The SARS-CoV-2 RNA is generally detectable in upper and lower respiratory specimens during the acute phase of infection. Negative results do not preclude SARS-CoV-2 infection, do not rule out co-infections with other pathogens, and should not be used as the sole basis for treatment or other patient management  decisions. Negative results must be combined with clinical  observations, patient history, and epidemiological information. The expected result is Negative.  Fact Sheet for Patients: SugarRoll.be  Fact Sheet for Healthcare Providers: https://www.woods-mathews.com/  This test is not yet approved or cleared by the [place] FDA and  has been authorized for detection and/or diagnosis of SARS-CoV-2 by FDA under an Emergency Use Authorization (EUA). This EUA will remain  in effect (meaning this test can be used) for the duration of the COVID-19 declaration under Se ction 564(b)(1) of the Act, 21 U.S.C. section 360bbb-3(b)(1), unless the authorization is terminated or revoked sooner.  Performed at [facility], [place] [address] [place], [place] [address]     Radiology Reports DG Abd 1 View  Result Date: 09/17/2020 CLINICAL DATA:  Status post nasogastric tube placement EXAM: ABDOMEN - 1 VIEW COMPARISON:  September 12, 2020 FINDINGS: Nasogastric tube with tip and side port overlying the stomach. The bowel gas pattern is normal. No radio-opaque calculi or other significant radiographic abnormality are seen. IMPRESSION: Nasogastric tube with tip and side port overlying the stomach. Electronically Signed   By: Dahlia Bailiff MD   On: 09/17/2020 13:07   DG Chest Port 1 View  Result Date: 09/13/2020 CLINICAL DATA:  Cough and shortness of breath EXAM: PORTABLE CHEST 1 VIEW COMPARISON:  Chest CT September 05, 2020 FINDINGS: Nasogastric tube tip and side port in stomach. No edema or airspace opacity. Heart size and pulmonary vascularity are normal. No adenopathy. No bone lesions. IMPRESSION: Nasogastric tube tip and side port in stomach. Lungs clear. Cardiac silhouette within normal limits. Electronically Signed   By: Lowella Grip [name] M.D.   On: 09/13/2020 08:00   DG Abd Portable 1V  Result Date: 09/12/2020 CLINICAL DATA:  NG tube in place.  Gastric  outlet obstruction. EXAM: PORTABLE ABDOMEN - 1 VIEW COMPARISON:  Abdominal CTA 09/05/2020 FINDINGS: Tip and side port of the enteric tube are below the diaphragm in the stomach, enteric tube is coiled within the gastric body. Decreased gastric distension from prior CT. Generalized paucity of small bowel gas. Residual high-density contrast throughout the colon. Vascular calcifications are seen. IMPRESSION: Tip and side port of the enteric tube below the diaphragm in the stomach, enteric tube coiled within the gastric body. Electronically Signed   By: Keith Rake M.D.   On: 09/12/2020 23:28   [place] EKG SITE RITE  Result Date: 09/13/2020 If Site Rite image not attached, placement could not be confirmed due to current cardiac rhythm.

## 2020-09-19 NOTE — Progress Notes (Signed)
Prealbumin improving. Discussed with Dr. Zenia Resides, will plan for OR tomorrow afternoon. Discussed with patient, he is in agreement with this plan.   Norm Parcel, [facility] 09/19/2020, 9:41 AM Please see Amion for pager number during day hours 7:00am-4:30pm

## 2020-09-19 NOTE — Progress Notes (Signed)
PHARMACY - TOTAL PARENTERAL NUTRITION CONSULT NOTE  Indication: Gastric outlet obstruction  Patient Measurements: Height: 5\' 2"  (157.5 cm) Weight: 77.2 kg (170 lb 3.1 oz) IBW/kg (Calculated) : 54.6 TPN AdjBW (KG): 60.2 Body mass index is 31.13 kg/m. Usual Weight: ~75kg per patient   Assessment:  65 yo M with PMH significant for GERD and now newly diagnosed gastric malignancy with planned bowel resection. Patient reports a normal diet (3 meals/day) PTA with no dietary restrictions and no unusual weight changes recently. He stopped eating regularly a few days PTA.  Pharmacy consulted to manage TPN.  Glucose / Insulin: no hx DM. CBGs controlled. SSI d/c'd 5/3 Electrolytes: all stable WNL  Renal: SCr < 1 stable, BUN up to 27 Hepatic: ALT mildly elevated. AST / Tbili / TG WNL, albumin 2.8, prealbumin improved to 14.2 Intake / Output; MIVF: UOP 0.6 ml/kg/hr, NG output 61mL, I/O's even, LBM 5/1 GI Imaging: 4/28 Abx Xray: enteric tube placed in stomach GI Surgeries / Procedures: SBR pending  Central access: PICC 09/13/20 TPN start date: 09/13/20  Nutritional Goals: (per updated RD recommendation 5/4) 1900-2100 kCal, 100-120g AA, > 1.9L fluid per day  Current Nutrition:  TPN; NPO  Plan:  Continue TPN at new goal rate 80 ml/hr - will provide 102g AA and 1962 kCal, meeting 100% of updated estimated needs Electrolytes in TPN: adjust for rate increase - Na 172mEq/L, K 28mEq/L, Ca 25mEq/L, Mg 23mEq/L, Phos 28mmol/L, Cl:Ac 1:1 Add standard MVI and trace elements to TPN D/C SSI/CBG checks 5/3. Consider restarting post-surgery pending glucose on AM labs. Monitor standard TPN labs on Mon/Thurs Surgery planning OR for SBR 5/6, f/u post-op plans   Arturo Morton, PharmD, BCPS Please check AMION for all [place] contact numbers Clinical Pharmacist 09/19/2020 7:43 AM

## 2020-09-20 ENCOUNTER — Encounter (HOSPITAL_COMMUNITY)
Admission: AD | Disposition: A | Discharge status: Home / Self Care | Payer: Self-pay | Source: Other Acute Inpatient Hospital | Attending: Internal Medicine

## 2020-09-20 ENCOUNTER — Inpatient Hospital Stay (HOSPITAL_COMMUNITY): Payer: PPO | Admitting: Certified Registered Nurse Anesthetist

## 2020-09-20 ENCOUNTER — Encounter (HOSPITAL_COMMUNITY): Payer: Self-pay | Admitting: Internal Medicine

## 2020-09-20 DIAGNOSIS — C169 Malignant neoplasm of stomach, unspecified: Secondary | ICD-10-CM | POA: Diagnosis not present

## 2020-09-20 DIAGNOSIS — K311 Adult hypertrophic pyloric stenosis: Secondary | ICD-10-CM | POA: Diagnosis not present

## 2020-09-20 HISTORY — PX: JEJUNOSTOMY: SHX313

## 2020-09-20 HISTORY — PX: GASTROJEJUNOSTOMY: SHX1697

## 2020-09-20 LAB — CBC WITH DIFFERENTIAL/PLATELET
Abs Immature Granulocytes: 0.16 10*3/uL — ABNORMAL HIGH (ref 0.00–0.07)
Basophils Absolute: 0.1 10*3/uL (ref 0.0–0.1)
Basophils Relative: 1 %
Eosinophils Absolute: 0.5 10*3/uL (ref 0.0–0.5)
Eosinophils Relative: 4 %
HCT: 33.8 % — ABNORMAL LOW (ref 39.0–52.0)
Hemoglobin: 11.1 g/dL — ABNORMAL LOW (ref 13.0–17.0)
Immature Granulocytes: 1 %
Lymphocytes Relative: 21 %
Lymphs Abs: 3.3 10*3/uL (ref 0.7–4.0)
MCH: 31.4 pg (ref 26.0–34.0)
MCHC: 32.8 g/dL (ref 30.0–36.0)
MCV: 95.5 fL (ref 80.0–100.0)
Monocytes Absolute: 1.6 10*3/uL — ABNORMAL HIGH (ref 0.1–1.0)
Monocytes Relative: 10 %
Neutro Abs: 9.9 10*3/uL — ABNORMAL HIGH (ref 1.7–7.7)
Neutrophils Relative %: 63 %
Platelets: 297 10*3/uL (ref 150–400)
RBC: 3.54 MIL/uL — ABNORMAL LOW (ref 4.22–5.81)
RDW: 14.2 % (ref 11.5–15.5)
WBC: 15.5 10*3/uL — ABNORMAL HIGH (ref 4.0–10.5)
nRBC: 0 % (ref 0.0–0.2)

## 2020-09-20 LAB — COMPREHENSIVE METABOLIC PANEL
ALT: 58 U/L — ABNORMAL HIGH (ref 0–44)
AST: 40 U/L (ref 15–41)
Albumin: 2.9 g/dL — ABNORMAL LOW (ref 3.5–5.0)
Alkaline Phosphatase: 64 U/L (ref 38–126)
Anion gap: 6 (ref 5–15)
BUN: 27 mg/dL — ABNORMAL HIGH (ref 8–23)
CO2: 28 mmol/L (ref 22–32)
Calcium: 9.1 mg/dL (ref 8.9–10.3)
Chloride: 101 mmol/L (ref 98–111)
Creatinine, Ser: 0.9 mg/dL (ref 0.61–1.24)
GFR, Estimated: >60 mL/min (ref >60)
Glucose, Bld: 115 mg/dL — ABNORMAL HIGH (ref 70–99)
Potassium: 4.3 mmol/L (ref 3.5–5.1)
Sodium: 135 mmol/L (ref 135–145)
Total Bilirubin: 0.5 mg/dL (ref 0.3–1.2)
Total Protein: 6.1 g/dL — ABNORMAL LOW (ref 6.5–8.1)

## 2020-09-20 LAB — TYPE AND SCREEN
ABO/RH(D): O POS
Antibody Screen: NEGATIVE

## 2020-09-20 LAB — MAGNESIUM: Magnesium: 2 mg/dL (ref 1.7–2.4)

## 2020-09-20 LAB — BRAIN NATRIURETIC PEPTIDE: B Natriuretic Peptide: 16.7 pg/mL (ref 0.0–100.0)

## 2020-09-20 SURGERY — GASTROJEJUNOSTOMY
Anesthesia: General | Site: Abdomen

## 2020-09-20 MED ORDER — ONDANSETRON HCL 4 MG/2ML IJ SOLN
INTRAMUSCULAR | Status: AC
  Filled 2020-09-20: qty 2

## 2020-09-20 MED ORDER — METRONIDAZOLE 500 MG/100ML IV SOLN
500.0000 mg | INTRAVENOUS | Status: AC
  Administered 2020-09-20: 500 mg via INTRAVENOUS

## 2020-09-20 MED ORDER — FENTANYL CITRATE (PF) 250 MCG/5ML IJ SOLN
INTRAMUSCULAR | Status: DC | PRN
  Administered 2020-09-20: 150 ug via INTRAVENOUS
  Administered 2020-09-20 (×3): 50 ug via INTRAVENOUS

## 2020-09-20 MED ORDER — LIDOCAINE 2% (20 MG/ML) 5 ML SYRINGE
INTRAMUSCULAR | Status: DC | PRN
  Administered 2020-09-20: 60 mg via INTRAVENOUS

## 2020-09-20 MED ORDER — CHLORHEXIDINE GLUCONATE 0.12 % MT SOLN: 15.0000 mL | Freq: Once | OROMUCOSAL | Status: AC

## 2020-09-20 MED ORDER — LIDOCAINE 2% (20 MG/ML) 5 ML SYRINGE
INTRAMUSCULAR | Status: AC
  Filled 2020-09-20: qty 5

## 2020-09-20 MED ORDER — PROPOFOL 10 MG/ML IV BOLUS
INTRAVENOUS | Status: DC | PRN
  Administered 2020-09-20: 100 mg via INTRAVENOUS

## 2020-09-20 MED ORDER — PROPOFOL 10 MG/ML IV BOLUS
INTRAVENOUS | Status: AC
  Filled 2020-09-20: qty 20

## 2020-09-20 MED ORDER — SODIUM CHLORIDE 0.9% FLUSH: 9.0000 mL | INTRAVENOUS | Status: DC | PRN

## 2020-09-20 MED ORDER — DIPHENHYDRAMINE HCL 50 MG/ML IJ SOLN: 12.5000 mg | Freq: Four times a day (QID) | INTRAMUSCULAR | Status: DC | PRN

## 2020-09-20 MED ORDER — TRAVASOL 10 % IV SOLN
INTRAVENOUS | Status: AC
  Filled 2020-09-20: qty 1017.6

## 2020-09-20 MED ORDER — PHENYLEPHRINE HCL-NACL 10-0.9 MG/250ML-% IV SOLN
INTRAVENOUS | Status: DC | PRN
  Administered 2020-09-20: 30 ug/min via INTRAVENOUS

## 2020-09-20 MED ORDER — DEXAMETHASONE SODIUM PHOSPHATE 10 MG/ML IJ SOLN
INTRAMUSCULAR | Status: AC
  Filled 2020-09-20: qty 1

## 2020-09-20 MED ORDER — ONDANSETRON HCL 4 MG/2ML IJ SOLN
INTRAMUSCULAR | Status: DC | PRN
  Administered 2020-09-20: 4 mg via INTRAVENOUS

## 2020-09-20 MED ORDER — HYDROMORPHONE HCL 1 MG/ML IJ SOLN
1.0000 mg | Freq: Once | INTRAMUSCULAR | Status: AC | PRN
  Administered 2020-09-20: 1 mg via INTRAVENOUS
  Filled 2020-09-20: qty 1

## 2020-09-20 MED ORDER — ROCURONIUM BROMIDE 10 MG/ML (PF) SYRINGE
PREFILLED_SYRINGE | INTRAVENOUS | Status: AC
  Filled 2020-09-20: qty 10

## 2020-09-20 MED ORDER — ONDANSETRON HCL 4 MG/2ML IJ SOLN: 4.0000 mg | Freq: Four times a day (QID) | INTRAMUSCULAR | Status: DC | PRN

## 2020-09-20 MED ORDER — LACTATED RINGERS IV SOLN: INTRAVENOUS | Status: DC

## 2020-09-20 MED ORDER — KETOROLAC TROMETHAMINE 15 MG/ML IJ SOLN: 15.0000 mg | Freq: Once | INTRAMUSCULAR | Status: DC | PRN

## 2020-09-20 MED ORDER — SUCCINYLCHOLINE CHLORIDE 200 MG/10ML IV SOSY
PREFILLED_SYRINGE | INTRAVENOUS | Status: DC | PRN
  Administered 2020-09-20: 80 mg via INTRAVENOUS

## 2020-09-20 MED ORDER — ACETAMINOPHEN 10 MG/ML IV SOLN
INTRAVENOUS | Status: AC
  Filled 2020-09-20: qty 100

## 2020-09-20 MED ORDER — 0.9 % SODIUM CHLORIDE (POUR BTL) OPTIME
TOPICAL | Status: DC | PRN
  Administered 2020-09-20 (×2): 1000 mL

## 2020-09-20 MED ORDER — NALOXONE HCL 0.4 MG/ML IJ SOLN: 0.4000 mg | INTRAMUSCULAR | Status: DC | PRN

## 2020-09-20 MED ORDER — FENTANYL CITRATE (PF) 250 MCG/5ML IJ SOLN
INTRAMUSCULAR | Status: AC
  Filled 2020-09-20: qty 5

## 2020-09-20 MED ORDER — SUGAMMADEX SODIUM 200 MG/2ML IV SOLN
INTRAVENOUS | Status: DC | PRN
  Administered 2020-09-20: 200 mg via INTRAVENOUS

## 2020-09-20 MED ORDER — MIDAZOLAM HCL 5 MG/5ML IJ SOLN
INTRAMUSCULAR | Status: DC | PRN
  Administered 2020-09-20: 2 mg via INTRAVENOUS

## 2020-09-20 MED ORDER — CHLORHEXIDINE GLUCONATE 0.12 % MT SOLN
OROMUCOSAL | Status: AC
  Administered 2020-09-20: 15 mL via OROMUCOSAL
  Filled 2020-09-20: qty 15

## 2020-09-20 MED ORDER — AMISULPRIDE (ANTIEMETIC) 5 MG/2ML IV SOLN: 10.0000 mg | Freq: Once | INTRAVENOUS | Status: DC | PRN

## 2020-09-20 MED ORDER — FENTANYL CITRATE (PF) 100 MCG/2ML IJ SOLN
25.0000 ug | INTRAMUSCULAR | Status: DC | PRN
  Administered 2020-09-20 (×3): 50 ug via INTRAVENOUS

## 2020-09-20 MED ORDER — SUCCINYLCHOLINE CHLORIDE 200 MG/10ML IV SOSY
PREFILLED_SYRINGE | INTRAVENOUS | Status: AC
  Filled 2020-09-20: qty 10

## 2020-09-20 MED ORDER — ROCURONIUM BROMIDE 10 MG/ML (PF) SYRINGE
PREFILLED_SYRINGE | INTRAVENOUS | Status: DC | PRN
  Administered 2020-09-20: 50 mg via INTRAVENOUS
  Administered 2020-09-20: 20 mg via INTRAVENOUS

## 2020-09-20 MED ORDER — FENTANYL CITRATE (PF) 100 MCG/2ML IJ SOLN
INTRAMUSCULAR | Status: AC
  Filled 2020-09-20: qty 2

## 2020-09-20 MED ORDER — MIDAZOLAM HCL 2 MG/2ML IJ SOLN
INTRAMUSCULAR | Status: AC
  Filled 2020-09-20: qty 2

## 2020-09-20 MED ORDER — ONDANSETRON HCL 4 MG/2ML IJ SOLN: 4.0000 mg | Freq: Once | INTRAMUSCULAR | Status: DC | PRN

## 2020-09-20 MED ORDER — DEXAMETHASONE SODIUM PHOSPHATE 10 MG/ML IJ SOLN
INTRAMUSCULAR | Status: DC | PRN
  Administered 2020-09-20: 5 mg via INTRAVENOUS

## 2020-09-20 MED ORDER — ACETAMINOPHEN 10 MG/ML IV SOLN
1000.0000 mg | Freq: Once | INTRAVENOUS | Status: DC | PRN
  Administered 2020-09-20: 1000 mg via INTRAVENOUS

## 2020-09-20 MED ORDER — PHENYLEPHRINE 40 MCG/ML (10ML) SYRINGE FOR IV PUSH (FOR BLOOD PRESSURE SUPPORT)
PREFILLED_SYRINGE | INTRAVENOUS | Status: DC | PRN
  Administered 2020-09-20 (×2): 120 ug via INTRAVENOUS

## 2020-09-20 MED ORDER — CEFAZOLIN SODIUM-DEXTROSE 2-3 GM-%(50ML) IV SOLR
INTRAVENOUS | Status: DC | PRN
  Administered 2020-09-20: 2 g via INTRAVENOUS

## 2020-09-20 MED ORDER — STERILE WATER FOR IRRIGATION IR SOLN
Status: DC | PRN
  Administered 2020-09-20: 1000 mL

## 2020-09-20 MED ORDER — HYDROMORPHONE 1 MG/ML IV SOLN
INTRAVENOUS | Status: DC
  Administered 2020-09-20 – 2020-09-21 (×2): 30 mg via INTRAVENOUS
  Administered 2020-09-21: 2 mg via INTRAVENOUS
  Administered 2020-09-21: 1.4 mg via INTRAVENOUS
  Administered 2020-09-21: 0.2 mg via INTRAVENOUS
  Administered 2020-09-21: 2.8 mL via INTRAVENOUS
  Administered 2020-09-22: 0.2 mg via INTRAVENOUS
  Administered 2020-09-22: 1.2 mg via INTRAVENOUS
  Administered 2020-09-22: 1 mg via INTRAVENOUS
  Administered 2020-09-22: 1.8 mg via INTRAVENOUS
  Administered 2020-09-22: 1.4 mg via INTRAVENOUS
  Administered 2020-09-22: 0.4 mg via INTRAVENOUS
  Administered 2020-09-22: 1.6 mg via INTRAVENOUS
  Administered 2020-09-23: 0.8 mg via INTRAVENOUS
  Administered 2020-09-23: 2 mg via INTRAVENOUS
  Administered 2020-09-23: 1.8 mg via INTRAVENOUS
  Administered 2020-09-23: 1.4 mg via INTRAVENOUS
  Administered 2020-09-23: 0.2 mg via INTRAVENOUS
  Administered 2020-09-24: 0.4 mg via INTRAVENOUS
  Administered 2020-09-24: 1.4 mg via INTRAVENOUS
  Administered 2020-09-24: 30 mg via INTRAVENOUS
  Administered 2020-09-24: 0.2 mg via INTRAVENOUS
  Administered 2020-09-24: 1.6 mg via INTRAVENOUS
  Administered 2020-09-24: 0.8 mg via INTRAVENOUS
  Administered 2020-09-24: 2.6 mg via INTRAVENOUS
  Administered 2020-09-25: 1.2 mg via INTRAVENOUS
  Administered 2020-09-25: 1.4 mg via INTRAVENOUS
  Administered 2020-09-25: 1 mg via INTRAVENOUS
  Administered 2020-09-25: 2 mg via INTRAVENOUS
  Administered 2020-09-25: 1.4 mg via INTRAVENOUS
  Administered 2020-09-25: 0.6 mg via INTRAVENOUS
  Administered 2020-09-26: 2.6 mg via INTRAVENOUS
  Administered 2020-09-26: 0.4 mg via INTRAVENOUS
  Administered 2020-09-26: 1.6 mg via INTRAVENOUS
  Administered 2020-09-26: 1.2 mg via INTRAVENOUS
  Administered 2020-09-27 (×3): 2.4 mg via INTRAVENOUS
  Administered 2020-09-27: 1.6 mg via INTRAVENOUS
  Administered 2020-09-28: 1.2 mg via INTRAVENOUS
  Administered 2020-09-28: 30 mg via INTRAVENOUS
  Administered 2020-09-28: 0.6 mg via INTRAVENOUS
  Filled 2020-09-20 (×3): qty 30

## 2020-09-20 MED ORDER — ORAL CARE MOUTH RINSE: 15.0000 mL | Freq: Once | OROMUCOSAL | Status: AC

## 2020-09-20 SURGICAL SUPPLY — 64 items
BIOPATCH RED 1 DISK 7.0 (GAUZE/BANDAGES/DRESSINGS) IMPLANT
BLADE CLIPPER SURG (BLADE) IMPLANT
CANISTER SUCT 3000ML PPV (MISCELLANEOUS) ×4 IMPLANT
CHLORAPREP W/TINT 26 (MISCELLANEOUS) ×4 IMPLANT
CLIP VESOCCLUDE SM WIDE 24/CT (CLIP) ×4 IMPLANT
CONT SPEC 4OZ CLIKSEAL STRL BL (MISCELLANEOUS) ×8 IMPLANT
COVER WAND RF STERILE (DRAPES) ×4 IMPLANT
DRAIN CHANNEL 19F RND (DRAIN) ×8 IMPLANT
DRAIN PENROSE 0.5X18 (DRAIN) IMPLANT
DRAPE INCISE IOBAN 66X45 STRL (DRAPES) ×4 IMPLANT
DRAPE LAPAROSCOPIC ABDOMINAL (DRAPES) ×4 IMPLANT
DRAPE WARM FLUID 44X44 (DRAPES) ×4 IMPLANT
DRSG OPSITE 6X11 MED (GAUZE/BANDAGES/DRESSINGS) ×4 IMPLANT
DRSG TEGADERM 4X4.75 (GAUZE/BANDAGES/DRESSINGS) IMPLANT
ELECT BLADE 6.5 EXT (BLADE) ×4 IMPLANT
ELECT PAD DSPR THERM+ ADLT (MISCELLANEOUS) ×4 IMPLANT
ELECT REM PT RETURN 9FT ADLT (ELECTROSURGICAL) ×4
ELECTRODE REM PT RTRN 9FT ADLT (ELECTROSURGICAL) ×3 IMPLANT
EVACUATOR 1/8 PVC DRAIN (DRAIN) ×4 IMPLANT
EVACUATOR SILICONE 100CC (DRAIN) ×8 IMPLANT
GAUZE SPONGE 4X4 12PLY STRL (GAUZE/BANDAGES/DRESSINGS) ×4 IMPLANT
GLOVE SURG POLY MICRO LF SZ5.5 (GLOVE) ×4 IMPLANT
GLOVE SURG UNDER POLY LF SZ6 (GLOVE) ×4 IMPLANT
GOWN STRL REUS W/ TWL LRG LVL3 (GOWN DISPOSABLE) ×6 IMPLANT
GOWN STRL REUS W/TWL LRG LVL3 (GOWN DISPOSABLE) ×2
HAND PENCIL TRP OPTION (MISCELLANEOUS) ×4 IMPLANT
HANDLE SUCTION POOLE (INSTRUMENTS) ×3 IMPLANT
KIT BASIN OR (CUSTOM PROCEDURE TRAY) ×4 IMPLANT
KIT TUBE JEJUNAL 16FR (CATHETERS) ×4 IMPLANT
KIT TURNOVER KIT B (KITS) ×4 IMPLANT
LIGASURE IMPACT 36 18CM CVD LR (INSTRUMENTS) IMPLANT
NS IRRIG 1000ML POUR BTL (IV SOLUTION) ×8 IMPLANT
PACK GENERAL/GYN (CUSTOM PROCEDURE TRAY) ×4 IMPLANT
PAD ARMBOARD 7.5X6 YLW CONV (MISCELLANEOUS) ×4 IMPLANT
PENCIL SMOKE EVACUATOR (MISCELLANEOUS) ×4 IMPLANT
RELOAD PROXIMATE 75MM BLUE (ENDOMECHANICALS) IMPLANT
RELOAD PROXIMATE 75MM GREEN (ENDOMECHANICALS) IMPLANT
RETRACTOR WOUND ALXS 34CM XLRG (MISCELLANEOUS) ×3 IMPLANT
RTRCTR WOUND ALEXIS 34CM XLRG (MISCELLANEOUS) ×4
SHEARS FOC LG CVD HARMONIC 17C (MISCELLANEOUS) IMPLANT
SLEEVE SUCTION CATH 165 (SLEEVE) ×4 IMPLANT
SPONGE LAP 18X18 RF (DISPOSABLE) IMPLANT
STAPLE ECHEON FLEX 60 POW ENDO (STAPLE) IMPLANT
STAPLER GUN LINEAR PROX 60 (STAPLE) ×4 IMPLANT
STAPLER PROXIMATE 75MM BLUE (STAPLE) ×4 IMPLANT
STAPLER VISISTAT 35W (STAPLE) ×4 IMPLANT
SUCTION POOLE HANDLE (INSTRUMENTS) ×4
SUT ETHILON 2 0 FS 18 (SUTURE) ×8 IMPLANT
SUT PDS AB 1 TP1 96 (SUTURE) ×8 IMPLANT
SUT PDS AB 3-0 SH 27 (SUTURE) IMPLANT
SUT SILK 2 0 SH (SUTURE) ×4 IMPLANT
SUT SILK 2 0 TIES 10X30 (SUTURE) ×4 IMPLANT
SUT SILK 2 0SH CR/8 30 (SUTURE) ×4 IMPLANT
SUT SILK 3 0 TIES 10X30 (SUTURE) ×4 IMPLANT
SUT SILK 3 0SH CR/8 30 (SUTURE) ×4 IMPLANT
SUT VIC AB 3-0 MH 27 (SUTURE) IMPLANT
SUT VIC AB 3-0 SH 27 (SUTURE) ×2
SUT VIC AB 3-0 SH 27X BRD (SUTURE) ×6 IMPLANT
SYR 20ML LL LF (SYRINGE) ×4 IMPLANT
TOWEL GREEN STERILE (TOWEL DISPOSABLE) ×4 IMPLANT
TOWEL GREEN STERILE FF (TOWEL DISPOSABLE) ×4 IMPLANT
TRAY FOLEY MTR SLVR 16FR STAT (SET/KITS/TRAYS/PACK) ×4 IMPLANT
TUBE CONNECTING 12X1/4 (SUCTIONS) ×4 IMPLANT
YANKAUER SUCT BULB TIP NO VENT (SUCTIONS) IMPLANT

## 2020-09-20 NOTE — Progress Notes (Signed)
Per SX PA, ok to leave NG tube out.  They will put back in during surgery.

## 2020-09-20 NOTE — Brief Op Note (Signed)
09/20/2020  5:59 PM  PATIENT:  Alexander [name]  65 y.o. male  PRE-OPERATIVE DIAGNOSIS:  GASTRIC OUTLET OBSTRUCTION  POST-OPERATIVE DIAGNOSIS:  GASTRIC OUTLET OBSTRUCTION  PROCEDURE:  Procedure(s): FEEDING JEJUNOSTOMY (N/A) GASTROJEJUNOSTOMY  SURGEON:  Surgeon(s) and Role:    * Dwan Bolt, MD - Primary    Rolm Bookbinder, MD - Assisting   ANESTHESIA:   general  EBL:  Minimal  BLOOD ADMINISTERED: None  DRAINS: (1) Jackson-Pratt drain(s) with closed bulb suction in the pancreas   LOCAL MEDICATIONS USED:  NONE  SPECIMEN:  No Specimen  DISPOSITION OF SPECIMEN:  N/A  COUNTS:  YES  DICTATION: .Note written in EPIC  PLAN OF CARE: Return to inpatient ward  PATIENT DISPOSITION:  PACU - hemodynamically stable.   Delay start of Pharmacological VTE agent (>24hrs) due to surgical blood loss or risk of bleeding: no  Findings: Unresectable locally advanced tumor in the gastric antrum, invading the head of the pancreas. No evidence of metastatic disease within the abdomen.

## 2020-09-20 NOTE — Anesthesia Procedure Notes (Signed)
Procedure Name: Intubation Date/Time: 09/20/2020 3:12 PM Performed by: Trinna Post., CRNA Pre-anesthesia Checklist: Patient identified, Emergency Drugs available, Suction available, Patient being monitored and Timeout performed Patient Re-evaluated:Patient Re-evaluated prior to induction Oxygen Delivery Method: Circle system utilized Preoxygenation: Pre-oxygenation with 100% oxygen Induction Type: IV induction, Rapid sequence and Cricoid Pressure applied Laryngoscope Size: Mac and 4 Grade View: Grade I Tube type: Oral Tube size: 7.5 mm Number of attempts: 1 Airway Equipment and Method: Stylet Placement Confirmation: ETT inserted through vocal cords under direct vision,  positive ETCO2 and breath sounds checked- equal and bilateral Secured at: 22 cm Tube secured with: Tape Dental Injury: Teeth and Oropharynx as per pre-operative assessment

## 2020-09-20 NOTE — Plan of Care (Signed)

## 2020-09-20 NOTE — Progress Notes (Signed)
PROGRESS NOTE                                                                                                                                                                                                             Patient Demographics:    Alexander [name], is a 65 y.o. male, DOB - June 29, 1955, JYN:[id number]  Outpatient Primary MD for the patient is Serita Grammes, MD    LOS - 8  Admit date - 09/12/2020    No chief complaint on file.      Brief Narrative (HPI from H&P)   - Alexander [name] is a 65 y.o. male with medical history significant for COPD, HTN, HLD, GERD who was admitted from [facility] for management of newly diagnosed gastric malignancy with gastric outlet obstruction, he had work-up at [facility] which included EGD and biopsy showing gastric adenocarcinoma, NG tube was placed and he was transferred to [facility] for further treatment.   Subjective:   Patient in bed, apparently his NGT was dislodged overnight, he reports some nausea, denies any vomiting or abdominal pain .    Assessment  & Plan :   1.  Gastric outlet obstruction caused by gastric adenocarcinoma as diagnosed with biopsy the hospital. -Management per general surgery, keep n.p.o.,continue with TPN via PICC line to optimize nutritional status in anticipation for surgery, prealbumin significantly improved 7> 14, plan to go to OR today at [facility] per general surgery. - seen by Oncologist Dr Marin Olp as well. -NGT dislodged overnight, staff discussed with general surgery, no need to reinsert before surgery  2. HTN.  Blood pressure acceptable on Catapres patch and Nitropaste, continue with as needed IV labetalol and hydralazine.  3. Upper GI bleed caused by gastric ulcer at [facility].  Resolved after IV PPI.  Type screen and monitor.   4. Hypokalemia - likely due to ongoing NG losses, replaced and  stable.       Condition - Extremely Guarded  Family Communication  : None at bedside  Code Status :  Full  Consults  :  CCS, Oncology  PUD Prophylaxis : PPI   Procedures  :      R. Arm PICC 09/13/20      Disposition Plan  :    Status is: Inpatient  Remains inpatient appropriate because:IV treatments appropriate  due to intensity of illness or inability to take PO   Dispo: The patient is from: Home              Anticipated d/c is to: Home              Patient currently is not medically stable to d/c.   Difficult to place patient No   DVT Prophylaxis  :    heparin injection 5,000 Units Start: 09/13/20 1400 SCDs Start: 09/12/20 2127    Lab Results  Component Value Date   PLT 297 09/20/2020    Diet :  Diet Order            Diet NPO time specified  Diet effective now                  Inpatient Medications  Scheduled Meds:  . Chlorhexidine Gluconate Cloth  6 each Topical Daily  . cloNIDine  0.3 mg Transdermal Q Fri  . heparin injection (subcutaneous)  5,000 Units Subcutaneous Q8H  . nitroGLYCERIN  1 inch Topical Q6H  . pantoprazole (PROTONIX) IV  40 mg Intravenous Q12H  . pneumococcal 23 valent vaccine  0.5 mL Intramuscular Tomorrow-1000   Continuous Infusions:  . TPN ADULT (ION) 80 mL/hr at 09/19/20 1727  . TPN ADULT (ION)     PRN Meds:.acetaminophen **OR** acetaminophen, artificial tears, hydrALAZINE, labetalol, [DISCONTINUED] ondansetron **OR** ondansetron (ZOFRAN) IV, sodium chloride flush  Antibiotics  :    Anti-infectives (From admission, onward)   Start     Dose/Rate Route Frequency Ordered Stop   09/20/20 1100  metroNIDAZOLE (FLAGYL) IVPB 500 mg        500 mg 100 mL/hr over 60 Minutes Intravenous To Short Stay 09/20/20 1049 09/20/20 1355   09/20/20 0600  ceFAZolin (ANCEF) IVPB 2g/100 mL premix        2 g 200 mL/hr over 30 Minutes Intravenous On call to O.R. 09/19/20 0940 09/20/20 [place] Yesenia Fontenette M.D on 09/20/2020 at  1:59 PM  To page go to [email or web address]   [facility] -  Office  [phone]    See all Orders from today for further details    Objective:   Vitals:   09/19/20 0446 09/19/20 1417 09/19/20 2109 09/20/20 0416  BP: (!) 122/92 (!) 148/95 130/90 (!) 146/87  Pulse: 80 80 85 86  Resp: 20 20 18 18   Temp: 98.2 F (36.8 C) 97.6 F (36.4 C) 97.8 F (36.6 C) 97.9 F (36.6 C)  TempSrc: Oral Oral Oral Oral  SpO2: 95% 96% 97% 97%  Weight:      Height:        Wt Readings from Last 3 Encounters:  09/13/20 77.2 kg  08/20/20 81.4 kg     Intake/Output Summary (Last 24 hours) at 09/20/2020 1359 Last data filed at 09/20/2020 1100 Gross per 24 hour  Intake 1292.94 ml  Output 700 ml  Net 592.94 ml     Physical Exam  Awake Alert, Oriented X 3, No new F.N deficits, Normal affect Symmetrical Chest wall movement, Good air movement bilaterally, CTAB RRR,No Gallops,Rubs or new Murmurs, No Parasternal Heave +ve B.Sounds, Abd Soft, No tenderness, No rebound - guarding or rigidity. No Cyanosis, Clubbing or edema, No new Rash or bruise         Data Review:    CBC Recent Labs  Lab 09/16/20 0433 09/17/20 0342 09/18/20 0207 09/19/20 0456 09/20/20 0259  WBC 11.6* 13.1* 13.3* 13.4* 15.5*  HGB 11.7* 12.1* 11.7* 11.2* 11.1*  HCT 32.7* 33.6* 35.4* 35.2* 33.8*  PLT 342 335 331 309 297  MCV 93.7 93.1 91.0 95.1 95.5  MCH 33.5 33.5 30.1 30.3 31.4  MCHC 35.8 36.0 33.1 31.8 32.8  RDW 14.1 14.1 14.4 14.4 14.2  LYMPHSABS 2.6 2.9 3.9 3.7 3.3  MONOABS 1.5* 1.7* 1.4* 1.5* 1.6*  EOSABS 0.5 0.6* 0.6* 0.6* 0.5  BASOSABS 0.1 0.1 0.1 0.1 0.1    Recent Labs  Lab 09/16/20 0433 09/17/20 0342 09/18/20 0207 09/19/20 0456 09/20/20 0259  NA 136 134* 135 137 135  K 4.1 4.4 4.3 4.4 4.3  CL 99 100 100 101 101  CO2 28 26 27 30 28   GLUCOSE 130* 137* 141* 125* 115*  BUN 19 23 26* 27* 27*  CREATININE 0.77 0.85 0.88 0.91 0.90  CALCIUM 9.1 9.1 9.1 9.1 9.1  AST 28 29 33 39 40  ALT 38 41 44 54*  58*  ALKPHOS 57 59 62 63 64  BILITOT 0.3 0.5 0.5 0.5 0.5  ALBUMIN 2.9* 2.8* 2.8* 2.8* 2.9*  MG 2.0 2.1 2.1 2.1 2.0  BNP 28.8 19.9 17.9 15.0 16.7    ------------------------------------------------------------------------------------------------------------------ No results for input(s): CHOL, HDL, LDLCALC, TRIG, CHOLHDL, LDLDIRECT in the last 72 hours.  Lab Results  Component Value Date   HGBA1C 5.6 09/13/2020   ------------------------------------------------------------------------------------------------------------------ No results for input(s): TSH, T4TOTAL, T3FREE, THYROIDAB in the last 72 hours.  Invalid input(s): FREET3  Cardiac Enzymes No results for input(s): CKMB, TROPONINI, MYOGLOBIN in the last 168 hours.  Invalid input(s): CK ------------------------------------------------------------------------------------------------------------------    Component Value Date/Time   BNP 16.7 09/20/2020 0259    Micro Results Recent Results (from the past 240 hour(s))  SARS CORONAVIRUS 2 (TAT 6-24 HRS) Nasopharyngeal Nasopharyngeal Swab     Status: None   Collection Time: 09/13/20  5:20 AM   Specimen: Nasopharyngeal Swab  Result Value Ref Range Status   SARS Coronavirus 2 NEGATIVE NEGATIVE Final    Comment: (NOTE) SARS-CoV-2 target nucleic acids are NOT DETECTED.  The SARS-CoV-2 RNA is generally detectable in upper and lower respiratory specimens during the acute phase of infection. Negative results do not preclude SARS-CoV-2 infection, do not rule out co-infections with other pathogens, and should not be used as the sole basis for treatment or other patient management decisions. Negative results must be combined with clinical observations, patient history, and epidemiological information. The expected result is Negative.  Fact Sheet for Patients: SugarRoll.be  Fact Sheet for Healthcare  Providers: https://www.woods-mathews.com/  This test is not yet approved or cleared by the [place] FDA and  has been authorized for detection and/or diagnosis of SARS-CoV-2 by FDA under an Emergency Use Authorization (EUA). This EUA will remain  in effect (meaning this test can be used) for the duration of the COVID-19 declaration under Se ction 564(b)(1) of the Act, 21 U.S.C. section 360bbb-3(b)(1), unless the authorization is terminated or revoked sooner.  Performed at [facility], [place] [address] [place], [place] [address]     Radiology Reports DG Abd 1 View  Result Date: 09/17/2020 CLINICAL DATA:  Status post nasogastric tube placement EXAM: ABDOMEN - 1 VIEW COMPARISON:  September 12, 2020 FINDINGS: Nasogastric tube with tip and side port overlying the stomach. The bowel gas pattern is normal. No radio-opaque calculi or other significant radiographic abnormality are seen. IMPRESSION: Nasogastric tube with tip and side port overlying the stomach. Electronically Signed   By: Dahlia Bailiff MD   On: 09/17/2020  13:07   DG Chest Port 1 View  Result Date: 09/13/2020 CLINICAL DATA:  Cough and shortness of breath EXAM: PORTABLE CHEST 1 VIEW COMPARISON:  Chest CT September 05, 2020 FINDINGS: Nasogastric tube tip and side port in stomach. No edema or airspace opacity. Heart size and pulmonary vascularity are normal. No adenopathy. No bone lesions. IMPRESSION: Nasogastric tube tip and side port in stomach. Lungs clear. Cardiac silhouette within normal limits. Electronically Signed   By: Lowella Grip [name] M.D.   On: 09/13/2020 08:00   DG Abd Portable 1V  Result Date: 09/12/2020 CLINICAL DATA:  NG tube in place.  Gastric outlet obstruction. EXAM: PORTABLE ABDOMEN - 1 VIEW COMPARISON:  Abdominal CTA 09/05/2020 FINDINGS: Tip and side port of the enteric tube are below the diaphragm in the stomach, enteric tube is coiled within the gastric body. Decreased gastric distension from  prior CT. Generalized paucity of small bowel gas. Residual high-density contrast throughout the colon. Vascular calcifications are seen. IMPRESSION: Tip and side port of the enteric tube below the diaphragm in the stomach, enteric tube coiled within the gastric body. Electronically Signed   By: Keith Rake M.D.   On: 09/12/2020 23:28   [place] EKG SITE RITE  Result Date: 09/13/2020 If Site Rite image not attached, placement could not be confirmed due to current cardiac rhythm.

## 2020-09-20 NOTE — Transfer of Care (Signed)
Immediate Anesthesia Transfer of Care Note  Patient: Alexander [name]  Procedure(s) Performed: FEEDING JEJUNOSTOMY (N/A Abdomen) GASTROJEJUNOSTOMY  Patient Location: PACU  Anesthesia Type:MAC and General  Level of Consciousness: drowsy and patient cooperative  Airway & Oxygen Therapy: Patient Spontanous Breathing and Patient connected to nasal cannula oxygen  Post-op Assessment: Report given to RN and Post -op Vital signs reviewed and stable  Post vital signs: Reviewed and stable  Last Vitals:  Vitals Value Taken Time  BP 151/77 09/20/20 1734  Temp    Pulse 78 09/20/20 1735  Resp 14 09/20/20 1735  SpO2 95 % 09/20/20 1735  Vitals shown include unvalidated device data.  Last Pain:  Vitals:   09/20/20 1433  TempSrc:   PainSc: 0-No pain      Patients Stated Pain Goal: 3 (02/77/41 2878)  Complications: No complications documented.

## 2020-09-20 NOTE — Progress Notes (Signed)
   Progress Note     Subjective: No complaints. NGT came out this AM.   Objective: Vital signs in last 24 hours: Temp:  [97.6 F (36.4 C)-97.9 F (36.6 C)] 97.9 F (36.6 C) (05/06 0416) Pulse Rate:  [80-86] 86 (05/06 0416) Resp:  [18-20] 18 (05/06 0416) BP: (130-148)/(87-95) 146/87 (05/06 0416) SpO2:  [96 %-97 %] 97 % (05/06 0416) Last BM Date: 09/15/20  Intake/Output from previous day: 05/05 0701 - 05/06 0700 In: 1292.9 [I.V.:1292.9] Out: 500 [Urine:500] Intake/Output this shift: No intake/output data recorded.  PE: General: pleasant, WD,overweightmale who issitting in chair HEENT: head is normocephalic, atraumatic. Sclera are noninjected. Ears and nose without any masses or lesions. Mouth is pink and moist Heart: regular, rate, and rhythm.  Lungs: Respiratory effort nonlabored Abd: soft, NT, ND, +BS, no masses, hernias, or organomegaly, NGT with minimal thin yellowish drainage MS: all 4 extremities are symmetrical with no cyanosis, clubbing, or edema. Skin: warm and dry with no masses, lesions, or rashes Neuro: Cranial nerves 2-12 grossly intact, sensation is normal throughout Psych: A&Ox3 with an appropriate affect   Lab Results:  Recent Labs    09/19/20 0456 09/20/20 0259  WBC 13.4* 15.5*  HGB 11.2* 11.1*  HCT 35.2* 33.8*  PLT 309 297   BMET Recent Labs    09/19/20 0456 09/20/20 0259  NA 137 135  K 4.4 4.3  CL 101 101  CO2 30 28  GLUCOSE 125* 115*  BUN 27* 27*  CREATININE 0.91 0.90  CALCIUM 9.1 9.1   PT/INR No results for input(s): LABPROT, INR in the last 72 hours. CMP     Component Value Date/Time   NA 135 09/20/2020 0259   K 4.3 09/20/2020 0259   CL 101 09/20/2020 0259   CO2 28 09/20/2020 0259   GLUCOSE 115 (H) 09/20/2020 0259   BUN 27 (H) 09/20/2020 0259   CREATININE 0.90 09/20/2020 0259   CALCIUM 9.1 09/20/2020 0259   PROT 6.1 (L) 09/20/2020 0259   ALBUMIN 2.9 (L) 09/20/2020 0259   AST 40 09/20/2020 0259   ALT 58 (H)  09/20/2020 0259   ALKPHOS 64 09/20/2020 0259   BILITOT 0.5 09/20/2020 0259   GFRNONAA >60 09/20/2020 0259   Lipase  No results found for: LIPASE     Studies/Results: No results found.  Anti-infectives: Anti-infectives (From admission, onward)   Start     Dose/Rate Route Frequency Ordered Stop   09/20/20 0600  ceFAZolin (ANCEF) IVPB 2g/100 mL premix        2 g 200 mL/hr over 30 Minutes Intravenous On call to O.R. 09/19/20 0940 09/21/20 0559       Assessment/Plan GOO secondary to gastric adenocarcinoma in antrum - NGT out, ok to leave out and keep strict NPO until OR today  -prealbumin 7.3>9.5>14.5 - continue TPN   LOS: 8 days    Norm Parcel, [facility] 09/20/2020, 7:54 AM Please see Amion for pager number during day hours 7:00am-4:30pm

## 2020-09-20 NOTE — Anesthesia Preprocedure Evaluation (Addendum)
Anesthesia Evaluation  Patient identified by MRN, date of birth, ID band Patient awake    Reviewed: Allergy & Precautions, NPO status , Patient's Chart, lab work & pertinent test results  Airway Mallampati: I  TM Distance: >3 FB Neck ROM: Full    Dental  (+) Edentulous Upper, Edentulous Lower   Pulmonary COPD, former smoker,    Pulmonary exam normal breath sounds clear to auscultation       Cardiovascular hypertension, Pt. on medications Normal cardiovascular exam Rhythm:Regular Rate:Normal  Normal sinus rhythm Nonspecific T wave abnormality No significant change since last tracing Confirmed by Croitoru, Mihai [phone]) on 09/13/2020 3:40:55 PM   Neuro/Psych negative neurological ROS  negative psych ROS   GI/Hepatic GERD  Medicated and Controlled,Gastric carcinoma   Endo/Other  negative endocrine ROS  Renal/GU negative Renal ROS     Musculoskeletal negative musculoskeletal ROS (+)   Abdominal   Peds negative pediatric ROS (+)  Hematology  (+) anemia , HLD   Anesthesia Other Findings GASTRIC OUTLET OBSTRUCTION  Reproductive/Obstetrics                         Anesthesia Physical Anesthesia Plan  ASA: III  Anesthesia Plan: General   Post-op Pain Management:    Induction: Intravenous and Rapid sequence  PONV Risk Score and Plan: 3 and Ondansetron, Dexamethasone, Midazolam and Treatment may vary due to age or medical condition  Airway Management Planned: Oral ETT  Additional Equipment:   Intra-op Plan:   Post-operative Plan: Extubation in OR  Informed Consent: I have reviewed the patients History and Physical, chart, labs and discussed the procedure including the risks, benefits and alternatives for the proposed anesthesia with the patient or authorized representative who has indicated his/her understanding and acceptance.     Dental advisory given  Plan Discussed with:  CRNA  Anesthesia Plan Comments:        Anesthesia Quick Evaluation

## 2020-09-20 NOTE — Progress Notes (Signed)
PHARMACY - TOTAL PARENTERAL NUTRITION CONSULT NOTE  Indication: Gastric outlet obstruction  Patient Measurements: Height: 5\' 2"  (157.5 cm) Weight: 77.2 kg (170 lb 3.1 oz) IBW/kg (Calculated) : 54.6 TPN AdjBW (KG): 60.2 Body mass index is 31.13 kg/m. Usual Weight: ~75kg per patient   Assessment:  65 yo M with PMH significant for GERD and now newly diagnosed gastric malignancy with planned bowel resection. Patient reports a normal diet (3 meals/day) PTA with no dietary restrictions and no unusual weight changes recently. He stopped eating regularly a few days PTA.  Pharmacy consulted to manage TPN.  Glucose / Insulin: no hx DM - CBGs controlled. SSI d/c'd 5/3 Electrolytes: all WNL  Renal: SCr < 1 stable, BUN up to 27 Hepatic: ALT mildly elevated.  AST / Tbili / TG WNL, albumin 2.9, prealbumin improved to 14.2 Intake / Output; MIVF: UOP 0.3 ml/kg/hr, NG 34mL, LBM 5/1, net +1L GI Imaging:  4/28 Abx Xray: enteric tube placed in stomach GI Surgeries / Procedures:  5/6 SBR pending  Central access: PICC 09/13/20 TPN start date: 09/13/20  Nutritional Goals: (per updated RD rec 5/4) 1900-2100 kCal, 100-120g AA, > 1.9L fluid per day  Current Nutrition:  TPN  Plan:  Continue TPN at goal rate 80 ml/hr to provide 102g AA and 1962 kCal, meeting 100% of updated estimated needs Electrolytes in TPN: increase Na 170mEq/L, K 16mEq/L, Ca 35mEq/L, Mg 24mEq/L, reduce Phos slightly to 77mmol/L, Cl:Ac 1:1 Add standard MVI and trace elements to TPN D/C SSI/CBG checks 5/3. Consider restarting post-surgery pending glucose on AM labs. Monitor standard TPN labs on Mon/Thurs Surgery planning OR for SBR 5/6  Lyriq Finerty D. Mina Marble, PharmD, BCPS, [place] 09/20/2020, 8:08 AM

## 2020-09-21 DIAGNOSIS — I10 Essential (primary) hypertension: Secondary | ICD-10-CM | POA: Diagnosis not present

## 2020-09-21 DIAGNOSIS — K311 Adult hypertrophic pyloric stenosis: Secondary | ICD-10-CM | POA: Diagnosis not present

## 2020-09-21 DIAGNOSIS — C169 Malignant neoplasm of stomach, unspecified: Secondary | ICD-10-CM | POA: Diagnosis not present

## 2020-09-21 LAB — CBC
HCT: 33.2 % — ABNORMAL LOW (ref 39.0–52.0)
Hemoglobin: 10.9 g/dL — ABNORMAL LOW (ref 13.0–17.0)
MCH: 31.1 pg (ref 26.0–34.0)
MCHC: 32.8 g/dL (ref 30.0–36.0)
MCV: 94.9 fL (ref 80.0–100.0)
Platelets: 235 10*3/uL (ref 150–400)
RBC: 3.5 MIL/uL — ABNORMAL LOW (ref 4.22–5.81)
RDW: 14.2 % (ref 11.5–15.5)
WBC: 24.6 10*3/uL — ABNORMAL HIGH (ref 4.0–10.5)
nRBC: 0 % (ref 0.0–0.2)

## 2020-09-21 LAB — COMPREHENSIVE METABOLIC PANEL
ALT: 55 U/L — ABNORMAL HIGH (ref 0–44)
AST: 36 U/L (ref 15–41)
Albumin: 2.7 g/dL — ABNORMAL LOW (ref 3.5–5.0)
Alkaline Phosphatase: 67 U/L (ref 38–126)
Anion gap: 7 (ref 5–15)
BUN: 26 mg/dL — ABNORMAL HIGH (ref 8–23)
CO2: 26 mmol/L (ref 22–32)
Calcium: 8.9 mg/dL (ref 8.9–10.3)
Chloride: 103 mmol/L (ref 98–111)
Creatinine, Ser: 0.79 mg/dL (ref 0.61–1.24)
GFR, Estimated: >60 mL/min (ref >60)
Glucose, Bld: 175 mg/dL — ABNORMAL HIGH (ref 70–99)
Potassium: 4.5 mmol/L (ref 3.5–5.1)
Sodium: 136 mmol/L (ref 135–145)
Total Bilirubin: 0.6 mg/dL (ref 0.3–1.2)
Total Protein: 5.9 g/dL — ABNORMAL LOW (ref 6.5–8.1)

## 2020-09-21 MED ORDER — TRAVASOL 10 % IV SOLN
INTRAVENOUS | Status: AC
  Filled 2020-09-21: qty 1017.6

## 2020-09-21 NOTE — Progress Notes (Signed)
PROGRESS NOTE                                                                                                                                                                                                             Patient Demographics:    Alexander [name], is a 65 y.o. male, DOB - [birth year], TGG:[id number]  Outpatient Primary MD for the patient is Serita Grammes, MD    LOS - 9  Admit date - 09/12/2020    No chief complaint on file.      Brief Narrative (HPI from H&P)    - Alexander [name] is a 65 y.o. male with medical history significant for COPD, HTN, HLD, GERD who was admitted from [facility] for management of newly diagnosed gastric malignancy with gastric outlet obstruction, he had work-up at [facility] which included EGD and biopsy showing gastric adenocarcinoma, NG tube was placed and he was transferred to [facility] for further treatment.   Subjective:   Patient in bed, reports abdominal pain currently controlled, denies any nausea or vomiting, no fever or chills.    Assessment  & Plan :   Gastric outlet obstruction caused by gastric adenocarcinoma as diagnosed with biopsy the hospital. -Management per general surgery, initially kept n.p.o., on TPN via PEG, given poor nutritional status , prealbumin significantly improved from 7 > 14, patient went for  ex lap, GJ bypass J tube place, placement of surgical drain  By  Dr. Zenia Resides on  09/20/20, as he had unresectable locally advanced tumor in the gastric antrum, invading the head of the pancreas, with no evidence of metastatic disease within the abdomen. -Postoperative management per general surgery, continue with PCA for today, continue with NG tube for today, awaiting return of bowel function. -We will await further recommendation from oncologist regarding further treatment plan.  HTN.  Blood pressure acceptable on Catapres patch and Nitropaste,  continue with as needed IV labetalol and hydralazine.  Upper GI bleed caused by gastric ulcer at [facility].  Resolved after IV PPI.  Type screen and monitor.   Hypokalemia - Repleted, continue to monitor       Condition - Extremely Guarded  Family Communication  : wife  at bedside  Code Status :  Full  Consults  :  CCS, Oncology  Procedures"  S/p ex lap, GJ  bypass J tube place, placement of surgical drain - Dr. Zenia Resides - 09/20/20  PUD Prophylaxis : PPI   Procedures  :      R. Arm PICC 09/13/20      Disposition Plan  :    Status is: Inpatient  Remains inpatient appropriate because:IV treatments appropriate due to intensity of illness or inability to take PO   Dispo: The patient is from: Home              Anticipated d/c is to: Home              Patient currently is not medically stable to d/c.   Difficult to place patient No   DVT Prophylaxis  :    heparin injection 5,000 Units Start: 09/13/20 1400 SCDs Start: 09/12/20 2127    Lab Results  Component Value Date   PLT 235 09/21/2020    Diet :  Diet Order            Diet NPO time specified  Diet effective now                  Inpatient Medications  Scheduled Meds:  . Chlorhexidine Gluconate Cloth  6 each Topical Daily  . cloNIDine  0.3 mg Transdermal Q Fri  . heparin injection (subcutaneous)  5,000 Units Subcutaneous Q8H  . HYDROmorphone   Intravenous Q4H  . nitroGLYCERIN  1 inch Topical Q6H  . pantoprazole (PROTONIX) IV  40 mg Intravenous Q12H  . pneumococcal 23 valent vaccine  0.5 mL Intramuscular Tomorrow-1000   Continuous Infusions:  . TPN ADULT (ION) 80 mL/hr at 09/20/20 2052  . TPN ADULT (ION)     PRN Meds:.acetaminophen **OR** acetaminophen, artificial tears, diphenhydrAMINE, hydrALAZINE, labetalol, naloxone **AND** sodium chloride flush, [DISCONTINUED] ondansetron **OR** ondansetron (ZOFRAN) IV, sodium chloride flush  Antibiotics  :    Anti-infectives (From admission, onward)    Start     Dose/Rate Route Frequency Ordered Stop   09/20/20 1100  metroNIDAZOLE (FLAGYL) IVPB 500 mg        500 mg 100 mL/hr over 60 Minutes Intravenous To Short Stay 09/20/20 1049 09/20/20 1355   09/20/20 0600  ceFAZolin (ANCEF) IVPB 2g/100 mL premix        2 g 200 mL/hr over 30 Minutes Intravenous On call to O.R. 09/19/20 0940 09/20/20 [place] [name] M.D on 09/21/2020 at 12:51 PM  To page go to [email or web address]   [facility] -  Office  [phone]    See all Orders from today for further details    Objective:   Vitals:   09/21/20 0711 09/21/20 0800 09/21/20 0803 09/21/20 1206  BP:   (!) 157/90 (!) 147/89  Pulse:   81 78  Resp: 14 18 15 18   Temp:   98.5 F (36.9 C) 98.6 F (37 C)  TempSrc:   Oral Oral  SpO2: 94% (!) 82% 92% 91%  Weight:      Height:        Wt Readings from Last 3 Encounters:  09/13/20 77.2 kg  08/20/20 81.4 kg     Intake/Output Summary (Last 24 hours) at 09/21/2020 1251 Last data filed at 09/21/2020 0500 Gross per 24 hour  Intake 1060 ml  Output 1110 ml  Net -50 ml     Physical Exam  Awake Alert, Oriented X 3, No new F.N deficits, Normal affect Symmetrical Chest wall movement, Good air movement bilaterally, CTAB RRR,No Gallops,Rubs or new Murmurs,  No Parasternal Heave Midline surgical wound bandaged with mesh, no bleeding or discharge can be seen, left upper abdomen jq, right lower abdomen JP drain.Marland Kitchen No Cyanosis, Clubbing or edema, No new Rash or bruise          Data Review:    CBC Recent Labs  Lab 09/16/20 0433 09/17/20 0342 09/18/20 0207 09/19/20 0456 09/20/20 0259 09/21/20 1002  WBC 11.6* 13.1* 13.3* 13.4* 15.5* 24.6*  HGB 11.7* 12.1* 11.7* 11.2* 11.1* 10.9*  HCT 32.7* 33.6* 35.4* 35.2* 33.8* 33.2*  PLT 342 335 331 309 297 235  MCV 93.7 93.1 91.0 95.1 95.5 94.9  MCH 33.5 33.5 30.1 30.3 31.4 31.1  MCHC 35.8 36.0 33.1 31.8 32.8 32.8  RDW 14.1 14.1 14.4 14.4 14.2 14.2  LYMPHSABS 2.6 2.9 3.9 3.7 3.3   --   MONOABS 1.5* 1.7* 1.4* 1.5* 1.6*  --   EOSABS 0.5 0.6* 0.6* 0.6* 0.5  --   BASOSABS 0.1 0.1 0.1 0.1 0.1  --     Recent Labs  Lab 09/16/20 0433 09/17/20 0342 09/18/20 0207 09/19/20 0456 09/20/20 0259 09/21/20 1002  NA 136 134* 135 137 135 136  K 4.1 4.4 4.3 4.4 4.3 4.5  CL 99 100 100 101 101 103  CO2 28 26 27 30 28 26   GLUCOSE 130* 137* 141* 125* 115* 175*  BUN 19 23 26* 27* 27* 26*  CREATININE 0.77 0.85 0.88 0.91 0.90 0.79  CALCIUM 9.1 9.1 9.1 9.1 9.1 8.9  AST 28 29 33 39 40 36  ALT 38 41 44 54* 58* 55*  ALKPHOS 57 59 62 63 64 67  BILITOT 0.3 0.5 0.5 0.5 0.5 0.6  ALBUMIN 2.9* 2.8* 2.8* 2.8* 2.9* 2.7*  MG 2.0 2.1 2.1 2.1 2.0  --   BNP 28.8 19.9 17.9 15.0 16.7  --     ------------------------------------------------------------------------------------------------------------------ No results for input(s): CHOL, HDL, LDLCALC, TRIG, CHOLHDL, LDLDIRECT in the last 72 hours.  Lab Results  Component Value Date   HGBA1C 5.6 09/13/2020   ------------------------------------------------------------------------------------------------------------------ No results for input(s): TSH, T4TOTAL, T3FREE, THYROIDAB in the last 72 hours.  Invalid input(s): FREET3  Cardiac Enzymes No results for input(s): CKMB, TROPONINI, MYOGLOBIN in the last 168 hours.  Invalid input(s): CK ------------------------------------------------------------------------------------------------------------------    Component Value Date/Time   BNP 16.7 09/20/2020 0259    Micro Results Recent Results (from the past 240 hour(s))  SARS CORONAVIRUS 2 (TAT 6-24 HRS) Nasopharyngeal Nasopharyngeal Swab     Status: None   Collection Time: 09/13/20  5:20 AM   Specimen: Nasopharyngeal Swab  Result Value Ref Range Status   SARS Coronavirus 2 NEGATIVE NEGATIVE Final    Comment: (NOTE) SARS-CoV-2 target nucleic acids are NOT DETECTED.  The SARS-CoV-2 RNA is generally detectable in upper and  lower respiratory specimens during the acute phase of infection. Negative results do not preclude SARS-CoV-2 infection, do not rule out co-infections with other pathogens, and should not be used as the sole basis for treatment or other patient management decisions. Negative results must be combined with clinical observations, patient history, and epidemiological information. The expected result is Negative.  Fact Sheet for Patients: SugarRoll.be  Fact Sheet for Healthcare Providers: https://www.woods-mathews.com/  This test is not yet approved or cleared by the [place] FDA and  has been authorized for detection and/or diagnosis of SARS-CoV-2 by FDA under an Emergency Use Authorization (EUA). This EUA will remain  in effect (meaning this test can be used) for the duration of the COVID-19 declaration under  Se ction 564(b)(1) of the Act, 21 U.S.C. section 360bbb-3(b)(1), unless the authorization is terminated or revoked sooner.  Performed at [facility], [place] [address] [place], [place] [address]     Radiology Reports DG Abd 1 View  Result Date: 09/17/2020 CLINICAL DATA:  Status post nasogastric tube placement EXAM: ABDOMEN - 1 VIEW COMPARISON:  September 12, 2020 FINDINGS: Nasogastric tube with tip and side port overlying the stomach. The bowel gas pattern is normal. No radio-opaque calculi or other significant radiographic abnormality are seen. IMPRESSION: Nasogastric tube with tip and side port overlying the stomach. Electronically Signed   By: Dahlia Bailiff MD   On: 09/17/2020 13:07   DG Chest Port 1 View  Result Date: 09/13/2020 CLINICAL DATA:  Cough and shortness of breath EXAM: PORTABLE CHEST 1 VIEW COMPARISON:  Chest CT September 05, 2020 FINDINGS: Nasogastric tube tip and side port in stomach. No edema or airspace opacity. Heart size and pulmonary vascularity are normal. No adenopathy. No bone lesions. IMPRESSION: Nasogastric tube  tip and side port in stomach. Lungs clear. Cardiac silhouette within normal limits. Electronically Signed   By: Lowella Grip [name] M.D.   On: 09/13/2020 08:00   DG Abd Portable 1V  Result Date: 09/12/2020 CLINICAL DATA:  NG tube in place.  Gastric outlet obstruction. EXAM: PORTABLE ABDOMEN - 1 VIEW COMPARISON:  Abdominal CTA 09/05/2020 FINDINGS: Tip and side port of the enteric tube are below the diaphragm in the stomach, enteric tube is coiled within the gastric body. Decreased gastric distension from prior CT. Generalized paucity of small bowel gas. Residual high-density contrast throughout the colon. Vascular calcifications are seen. IMPRESSION: Tip and side port of the enteric tube below the diaphragm in the stomach, enteric tube coiled within the gastric body. Electronically Signed   By: Keith Rake M.D.   On: 09/12/2020 23:28   [place] EKG SITE RITE  Result Date: 09/13/2020 If Site Rite image not attached, placement could not be confirmed due to current cardiac rhythm.

## 2020-09-21 NOTE — Plan of Care (Signed)
  Problem: Education: Goal: Knowledge of General Education information will improve Description: Including pain rating scale, medication(s)/side effects and non-pharmacologic comfort measures Outcome: Progressing   Problem: Clinical Measurements: Goal: Ability to maintain clinical measurements within normal limits will improve Outcome: Progressing Goal: Will remain free from infection Outcome: Progressing Goal: Diagnostic test results will improve Outcome: Progressing Goal: Respiratory complications will improve Outcome: Progressing Goal: Cardiovascular complication will be avoided Outcome: Progressing   Problem: Health Behavior/Discharge Planning: Goal: Ability to manage health-related needs will improve Outcome: Progressing   Problem: Activity: Goal: Risk for activity intolerance will decrease Outcome: Progressing   Problem: Coping: Goal: Level of anxiety will decrease Outcome: Progressing   Problem: Nutrition: Goal: Adequate nutrition will be maintained Outcome: Progressing   Problem: Elimination: Goal: Will not experience complications related to bowel motility Outcome: Progressing Goal: Will not experience complications related to urinary retention Outcome: Progressing   Problem: Pain Managment: Goal: General experience of comfort will improve Outcome: Progressing

## 2020-09-21 NOTE — Progress Notes (Signed)
PHARMACY - TOTAL PARENTERAL NUTRITION CONSULT NOTE  Indication: Gastric outlet obstruction  Patient Measurements: Height: 5\' 2"  (157.5 cm) Weight: 77.2 kg (170 lb 3.1 oz) IBW/kg (Calculated) : 54.6 TPN AdjBW (KG): 60.2 Body mass index is 31.13 kg/m. Usual Weight: ~75kg per patient   Assessment:  65 yo M with PMH significant for GERD and now newly diagnosed gastric malignancy with planned bowel resection. Patient reports a normal diet (3 meals/day) PTA with no dietary restrictions and no unusual weight changes recently. He stopped eating regularly a few days PTA.  Pharmacy consulted to manage TPN.  Glucose / Insulin: no hx DM - BG controlled. SSI d/c'd 5/3 Electrolytes: all WNL  Renal: SCr < 1 stable, BUN up to 27 Hepatic: ALT mildly elevated. AST / Tbili / TG WNL. Albumin 2.9, prealbumin improved to 14.2 Intake / Output; MIVF: UOP 0.8 ml/kg/hr, drain output 95 ml/24hrs, LBM 5/1 GI Imaging: 4/28 Abx Xray: enteric tube placed in stomach GI Surgeries / Procedures: 5/6 ex-lap, GJ bypass, J tube placed, placement of surgical drain  Central access: PICC 09/13/20 TPN start date: 09/13/20  Nutritional Goals: (per updated RD rec 5/4) 1900-2100 kCal, 100-120g AA, > 1.9L fluid per day  Current Nutrition:  TPN; NPO Possibly can start trickle TF 5/8 vs. early next week per Surgery  Plan:  Continue TPN at goal rate 80 ml/hr - will provide 102g AA and 1962 kCal, meeting 100% of updated estimated needs Electrolytes in TPN: Na 153mEq/L, K 4mEq/L, Ca 51mEq/L, Mg 28mEq/L, Phos 42mmol/L, Cl:Ac 1:1 Add standard MVI and trace elements to TPN D/C SSI/CBG checks 5/3 with no insulin required. Monitor BG Monitor standard TPN labs on Mon/Thurs F/u Surgery plans to start TF post-op and ability to wean TPN   Arturo Morton, PharmD, BCPS Please check AMION for all [place] contact numbers Clinical Pharmacist 09/21/2020 8:37 AM

## 2020-09-21 NOTE — Progress Notes (Signed)
1 Day Post-Op  Subjective: CC: Patient's wife at bedside.  He reports some soreness around his incision that is well controlled with PCA.  NG tube in place with 100 cc of output in canister.  He denies any nausea.  No flatus or BM yet.  Currently NPO.  Foley in place.  Drain serosanguineous.  He has not gotten out of bed.  Patient's wife is a retired [employer]  Objective: Vital signs in last 24 hours: Temp:  [97.6 F (36.4 C)-98.5 F (36.9 C)] 98.5 F (36.9 C) (05/07 0803) Pulse Rate:  [74-82] 81 (05/07 0803) Resp:  [8-19] 15 (05/07 0803) BP: (125-157)/(73-97) 157/90 (05/07 0803) SpO2:  [92 %-97 %] 92 % (05/07 0803) FiO2 (%):  [0 %] 0 % (05/07 0711) Last BM Date: 09/15/20  Intake/Output from previous day: 05/06 0701 - 05/07 0700 In: 1060 [P.O.:10; I.V.:1000; IV Piggyback:50] Out: 2836 [Urine:1450; Drains:135] Intake/Output this shift: No intake/output data recorded.  PE: Gen:  Alert, NAD, pleasant Card:  RRR Pulm:  CTAB, no W/R/R, effort normal. On o2 Abd: Soft, mild distension, hypoactive bowel sounds, appropriately tender around incision.  Midline wound with honeycomb dressing in place is clean dry intact.  Drain serosanguineous.  J-tube in place.  NG tube in place with 150 cc of clear fluid in canister Ext:  No LE edema  Psych: A&Ox3  Skin: no rashes noted, warm and dry   Lab Results:  Recent Labs    09/19/20 0456 09/20/20 0259  WBC 13.4* 15.5*  HGB 11.2* 11.1*  HCT 35.2* 33.8*  PLT 309 297   BMET Recent Labs    09/19/20 0456 09/20/20 0259  NA 137 135  K 4.4 4.3  CL 101 101  CO2 30 28  GLUCOSE 125* 115*  BUN 27* 27*  CREATININE 0.91 0.90  CALCIUM 9.1 9.1   PT/INR No results for input(s): LABPROT, INR in the last 72 hours. CMP     Component Value Date/Time   NA 135 09/20/2020 0259   K 4.3 09/20/2020 0259   CL 101 09/20/2020 0259   CO2 28 09/20/2020 0259   GLUCOSE 115 (H) 09/20/2020 0259   BUN 27 (H) 09/20/2020 0259   CREATININE 0.90 09/20/2020  0259   CALCIUM 9.1 09/20/2020 0259   PROT 6.1 (L) 09/20/2020 0259   ALBUMIN 2.9 (L) 09/20/2020 0259   AST 40 09/20/2020 0259   ALT 58 (H) 09/20/2020 0259   ALKPHOS 64 09/20/2020 0259   BILITOT 0.5 09/20/2020 0259   GFRNONAA >60 09/20/2020 0259   Lipase  No results found for: LIPASE     Studies/Results: No results found.  Anti-infectives: Anti-infectives (From admission, onward)   Start     Dose/Rate Route Frequency Ordered Stop   09/20/20 1100  metroNIDAZOLE (FLAGYL) IVPB 500 mg        500 mg 100 mL/hr over 60 Minutes Intravenous To Short Stay 09/20/20 1049 09/20/20 1355   09/20/20 0600  ceFAZolin (ANCEF) IVPB 2g/100 mL premix        2 g 200 mL/hr over 30 Minutes Intravenous On call to O.R. 09/19/20 0940 09/20/20 1324       Assessment/Plan GOO from a gastric cancer at the antrum S/p ex lap, GJ bypass J tube place, placement of surgical drain - Dr. Zenia Resides - 09/20/20 - POD #1 - OR findings: Unresectable locally advanced tumor in the gastric antrum, invading the head of the pancreas. No evidence of metastatic disease within the abdomen.  -Continue PCA today -Continue  NG tube for today.  Await return of bowel function.  May be able to start trickle tube feeds through J-tube tomorrow versus early next week.  Currently on TNA -Continue Foley until out of bed.  Needs to mobilize.  PT ordered -Cont surgical drain -Pulm toilet  FEN - TPN, NGT, NPO, J-tube clamped, IVF per TRH VTE - SCDs, hep subq ID - Ance/flagylf periop Foley - in place as above   LOS: 9 days    Jillyn Ledger , [facility] 09/21/2020, 9:33 AM Please see Amion for pager number during day hours 7:00am-4:30pm

## 2020-09-22 DIAGNOSIS — C169 Malignant neoplasm of stomach, unspecified: Secondary | ICD-10-CM | POA: Diagnosis not present

## 2020-09-22 DIAGNOSIS — I10 Essential (primary) hypertension: Secondary | ICD-10-CM | POA: Diagnosis not present

## 2020-09-22 DIAGNOSIS — K311 Adult hypertrophic pyloric stenosis: Secondary | ICD-10-CM | POA: Diagnosis not present

## 2020-09-22 LAB — GLUCOSE, CAPILLARY
Glucose-Capillary: 171 mg/dL — ABNORMAL HIGH (ref 70–99)
Glucose-Capillary: 159 mg/dL — ABNORMAL HIGH (ref 70–99)
Glucose-Capillary: 165 mg/dL — ABNORMAL HIGH (ref 70–99)
Glucose-Capillary: 146 mg/dL — ABNORMAL HIGH (ref 70–99)

## 2020-09-22 LAB — COMPREHENSIVE METABOLIC PANEL
ALT: 43 U/L (ref 0–44)
AST: 27 U/L (ref 15–41)
Albumin: 2.7 g/dL — ABNORMAL LOW (ref 3.5–5.0)
Alkaline Phosphatase: 67 U/L (ref 38–126)
Anion gap: 4 — ABNORMAL LOW (ref 5–15)
BUN: 27 mg/dL — ABNORMAL HIGH (ref 8–23)
CO2: 30 mmol/L (ref 22–32)
Calcium: 8.8 mg/dL — ABNORMAL LOW (ref 8.9–10.3)
Chloride: 104 mmol/L (ref 98–111)
Creatinine, Ser: 0.88 mg/dL (ref 0.61–1.24)
GFR, Estimated: >60 mL/min (ref >60)
Glucose, Bld: 171 mg/dL — ABNORMAL HIGH (ref 70–99)
Potassium: 4.2 mmol/L (ref 3.5–5.1)
Sodium: 138 mmol/L (ref 135–145)
Total Bilirubin: 0.4 mg/dL (ref 0.3–1.2)
Total Protein: 5.9 g/dL — ABNORMAL LOW (ref 6.5–8.1)

## 2020-09-22 LAB — CBC
HCT: 25.8 % — ABNORMAL LOW (ref 39.0–52.0)
Hemoglobin: 9.1 g/dL — ABNORMAL LOW (ref 13.0–17.0)
MCH: 34.7 pg — ABNORMAL HIGH (ref 26.0–34.0)
MCHC: 35.3 g/dL (ref 30.0–36.0)
MCV: 98.5 fL (ref 80.0–100.0)
Platelets: 207 10*3/uL (ref 150–400)
RBC: 2.62 MIL/uL — ABNORMAL LOW (ref 4.22–5.81)
RDW: 14.6 % (ref 11.5–15.5)
WBC: 21.8 10*3/uL — ABNORMAL HIGH (ref 4.0–10.5)
nRBC: 0.1 % (ref 0.0–0.2)

## 2020-09-22 MED ORDER — TRAVASOL 10 % IV SOLN
INTRAVENOUS | Status: AC
  Filled 2020-09-22: qty 1017.6

## 2020-09-22 MED ORDER — INSULIN ASPART 100 UNIT/ML IJ SOLN
0.0000 [IU] | Freq: Four times a day (QID) | INTRAMUSCULAR | Status: DC
  Administered 2020-09-22: 2 [IU] via SUBCUTANEOUS
  Administered 2020-09-22 (×2): 3 [IU] via SUBCUTANEOUS
  Administered 2020-09-23 (×2): 2 [IU] via SUBCUTANEOUS
  Administered 2020-09-23: 3 [IU] via SUBCUTANEOUS
  Administered 2020-09-24 – 2020-09-26 (×10): 2 [IU] via SUBCUTANEOUS

## 2020-09-22 NOTE — Progress Notes (Signed)
Pain assessed throughout the shift- pain scale is usually a 7. Encouraged use of PCA.   Patient stated "I am okay; I feel that my pain is relieved with the pain medicine and the ice pack".   Patient voiding well afte foley catheter removal.

## 2020-09-22 NOTE — Progress Notes (Signed)
PROGRESS NOTE                                                                                                                                                                                                             Patient Demographics:    Alexander [name], is a 65 y.o. male, DOB - [birth year], PNT:[id number]  Outpatient Primary MD for the patient is Serita Grammes, MD    LOS - 10  Admit date - 09/12/2020    No chief complaint on file.      Brief Narrative (HPI from H&P)    - Alexander [name] is a 65 y.o. male with medical history significant for COPD, HTN, HLD, GERD who was admitted from [facility] for management of newly diagnosed gastric malignancy with gastric outlet obstruction, he had work-up at [facility] which included EGD and biopsy showing gastric adenocarcinoma, NG tube was placed and he was transferred to [facility] for further treatment.   Subjective:   Patient in bed, no nausea, no vomiting, abdominal pain is controlled     Assessment  & Plan :   Gastric outlet obstruction caused by gastric adenocarcinoma as diagnosed with biopsy the hospital. -Management per general surgery, initially kept n.p.o., on TPN via PEG, given poor nutritional status , prealbumin significantly improved from 7 > 14, patient went for  ex lap, GJ bypass J tube place, placement of surgical drain  By  Dr. Zenia Resides on  09/20/20, as he had unresectable locally advanced tumor in the gastric antrum, invading the head of the pancreas, with no evidence of metastatic disease within the abdomen. -Postoperative management per general surgery, continue with PCA till able to give medicine through J-tube, general surgery will start trickle tube feeds through J-tube possible clamping of NGT. -I have discussed with Dr. Marin Olp on 5/7, he will see patient again tomorrow to discuss further treatment plans regarding chemotherapy/radiation   -Leukocytosis most likely stress related from surgery, trending down, he is afebrile, and nontoxic-appearing.  HTN.  Blood pressure acceptable on Catapres patch and Nitropaste, continue with as needed IV labetalol and hydralazine.  Upper GI bleed caused by gastric ulcer at [facility].  Resolved after IV PPI.  Type screen and monitor.   Hypokalemia - Repleted, continue to monitor       Condition - Extremely Guarded  Family  Communication  : wife  at bedside  Code Status :  Full  Consults  :  CCS, Oncology  Procedures"  S/p ex lap, GJ bypass J tube place, placement of surgical drain - Dr. Zenia Resides - 09/20/20  PUD Prophylaxis : PPI   Procedures  :      R. Arm PICC 09/13/20   ex lap, GJ bypass J tube place, placement of surgical drain  By  Dr. Zenia Resides on  09/20/20,      Disposition Plan  :    Status is: Inpatient  Remains inpatient appropriate because:IV treatments appropriate due to intensity of illness or inability to take PO   Dispo: The patient is from: Home              Anticipated d/c is to: Home              Patient currently is not medically stable to d/c.   Difficult to place patient No   DVT Prophylaxis  :    heparin injection 5,000 Units Start: 09/13/20 1400 SCDs Start: 09/12/20 2127    Lab Results  Component Value Date   PLT 207 09/22/2020    Diet :  Diet Order            Diet NPO time specified  Diet effective now                  Inpatient Medications  Scheduled Meds:  . Chlorhexidine Gluconate Cloth  6 each Topical Daily  . cloNIDine  0.3 mg Transdermal Q Fri  . heparin injection (subcutaneous)  5,000 Units Subcutaneous Q8H  . HYDROmorphone   Intravenous Q4H  . insulin aspart  0-15 Units Subcutaneous Q6H  . nitroGLYCERIN  1 inch Topical Q6H  . pantoprazole (PROTONIX) IV  40 mg Intravenous Q12H  . pneumococcal 23 valent vaccine  0.5 mL Intramuscular Tomorrow-1000   Continuous Infusions:  . TPN ADULT (ION) 80 mL/hr at 09/21/20  1821  . TPN ADULT (ION)     PRN Meds:.acetaminophen **OR** acetaminophen, artificial tears, diphenhydrAMINE, hydrALAZINE, labetalol, naloxone **AND** sodium chloride flush, [DISCONTINUED] ondansetron **OR** ondansetron (ZOFRAN) IV, sodium chloride flush  Antibiotics  :    Anti-infectives (From admission, onward)   Start     Dose/Rate Route Frequency Ordered Stop   09/20/20 1100  metroNIDAZOLE (FLAGYL) IVPB 500 mg        500 mg 100 mL/hr over 60 Minutes Intravenous To Short Stay 09/20/20 1049 09/20/20 1355   09/20/20 0600  ceFAZolin (ANCEF) IVPB 2g/100 mL premix        2 g 200 mL/hr over 30 Minutes Intravenous On call to O.R. 09/19/20 0940 09/20/20 Ericson Esau Fridman M.D on 09/22/2020 at 3:23 PM  To page go to [email or web address]   [facility] -  Office  [phone]    See all Orders from today for further details    Objective:   Vitals:   09/22/20 0707 09/22/20 0749 09/22/20 1107 09/22/20 1124  BP:  111/70  128/76  Pulse:  64  80  Resp: 14 17 16 18   Temp:  97.6 F (36.4 C)  98.6 F (37 C)  TempSrc:  Oral  Oral  SpO2: 90% 90% 92% 91%  Weight:      Height:        Wt Readings from Last 3 Encounters:  09/13/20 77.2 kg  08/20/20 81.4 kg     Intake/Output Summary (Last 24 hours) at 09/22/2020 1523  Last data filed at 09/22/2020 0958 Gross per 24 hour  Intake 240 ml  Output 1920 ml  Net -1680 ml     Physical Exam  Awake Alert, Oriented X 3, No new F.N deficits, Normal affect Symmetrical Chest wall movement, Good air movement bilaterally, CTAB RRR,No Gallops,Rubs or new Murmurs, No Parasternal Heave +ve B.Sounds, Abd Soft, No tenderness, G-tube in place, midline wound with honeycomb dressing, clean and intact No Cyanosis, Clubbing or edema, No new Rash or bruise            Data Review:    CBC Recent Labs  Lab 09/16/20 0433 09/17/20 0342 09/18/20 0207 09/19/20 0456 09/20/20 0259 09/21/20 1002 09/22/20 0458  WBC 11.6* 13.1* 13.3*  13.4* 15.5* 24.6* 21.8*  HGB 11.7* 12.1* 11.7* 11.2* 11.1* 10.9* 9.1*  HCT 32.7* 33.6* 35.4* 35.2* 33.8* 33.2* 25.8*  PLT 342 335 331 309 297 235 207  MCV 93.7 93.1 91.0 95.1 95.5 94.9 98.5  MCH 33.5 33.5 30.1 30.3 31.4 31.1 34.7*  MCHC 35.8 36.0 33.1 31.8 32.8 32.8 35.3  RDW 14.1 14.1 14.4 14.4 14.2 14.2 14.6  LYMPHSABS 2.6 2.9 3.9 3.7 3.3  --   --   MONOABS 1.5* 1.7* 1.4* 1.5* 1.6*  --   --   EOSABS 0.5 0.6* 0.6* 0.6* 0.5  --   --   BASOSABS 0.1 0.1 0.1 0.1 0.1  --   --     Recent Labs  Lab 09/16/20 0433 09/17/20 0342 09/18/20 0207 09/19/20 0456 09/20/20 0259 09/21/20 1002 09/22/20 0458  NA 136 134* 135 137 135 136 138  K 4.1 4.4 4.3 4.4 4.3 4.5 4.2  CL 99 100 100 101 101 103 104  CO2 28 26 27 30 28 26 30   GLUCOSE 130* 137* 141* 125* 115* 175* 171*  BUN 19 23 26* 27* 27* 26* 27*  CREATININE 0.77 0.85 0.88 0.91 0.90 0.79 0.88  CALCIUM 9.1 9.1 9.1 9.1 9.1 8.9 8.8*  AST 28 29 33 39 40 36 27  ALT 38 41 44 54* 58* 55* 43  ALKPHOS 57 59 62 63 64 67 67  BILITOT 0.3 0.5 0.5 0.5 0.5 0.6 0.4  ALBUMIN 2.9* 2.8* 2.8* 2.8* 2.9* 2.7* 2.7*  MG 2.0 2.1 2.1 2.1 2.0  --   --   BNP 28.8 19.9 17.9 15.0 16.7  --   --     ------------------------------------------------------------------------------------------------------------------ No results for input(s): CHOL, HDL, LDLCALC, TRIG, CHOLHDL, LDLDIRECT in the last 72 hours.  Lab Results  Component Value Date   HGBA1C 5.6 09/13/2020   ------------------------------------------------------------------------------------------------------------------ No results for input(s): TSH, T4TOTAL, T3FREE, THYROIDAB in the last 72 hours.  Invalid input(s): FREET3  Cardiac Enzymes No results for input(s): CKMB, TROPONINI, MYOGLOBIN in the last 168 hours.  Invalid input(s): CK ------------------------------------------------------------------------------------------------------------------    Component Value Date/Time   BNP 16.7 09/20/2020  0259    Micro Results Recent Results (from the past 240 hour(s))  SARS CORONAVIRUS 2 (TAT 6-24 HRS) Nasopharyngeal Nasopharyngeal Swab     Status: None   Collection Time: 09/13/20  5:20 AM   Specimen: Nasopharyngeal Swab  Result Value Ref Range Status   SARS Coronavirus 2 NEGATIVE NEGATIVE Final    Comment: (NOTE) SARS-CoV-2 target nucleic acids are NOT DETECTED.  The SARS-CoV-2 RNA is generally detectable in upper and lower respiratory specimens during the acute phase of infection. Negative results do not preclude SARS-CoV-2 infection, do not rule out co-infections with other pathogens, and should not be used as the  sole basis for treatment or other patient management decisions. Negative results must be combined with clinical observations, patient history, and epidemiological information. The expected result is Negative.  Fact Sheet for Patients: SugarRoll.be  Fact Sheet for Healthcare Providers: https://www.woods-mathews.com/  This test is not yet approved or cleared by the [place] FDA and  has been authorized for detection and/or diagnosis of SARS-CoV-2 by FDA under an Emergency Use Authorization (EUA). This EUA will remain  in effect (meaning this test can be used) for the duration of the COVID-19 declaration under Se ction 564(b)(1) of the Act, 21 U.S.C. section 360bbb-3(b)(1), unless the authorization is terminated or revoked sooner.  Performed at [facility], [place] [address], [place], [place] [address]     Radiology Reports DG Abd 1 View  Result Date: 09/17/2020 CLINICAL DATA:  Status post nasogastric tube placement EXAM: ABDOMEN - 1 VIEW COMPARISON:  September 12, 2020 FINDINGS: Nasogastric tube with tip and side port overlying the stomach. The bowel gas pattern is normal. No radio-opaque calculi or other significant radiographic abnormality are seen. IMPRESSION: Nasogastric tube with tip and side port overlying the  stomach. Electronically Signed   By: Dahlia Bailiff MD   On: 09/17/2020 13:07   DG Chest Port 1 View  Result Date: 09/13/2020 CLINICAL DATA:  Cough and shortness of breath EXAM: PORTABLE CHEST 1 VIEW COMPARISON:  Chest CT September 05, 2020 FINDINGS: Nasogastric tube tip and side port in stomach. No edema or airspace opacity. Heart size and pulmonary vascularity are normal. No adenopathy. No bone lesions. IMPRESSION: Nasogastric tube tip and side port in stomach. Lungs clear. Cardiac silhouette within normal limits. Electronically Signed   By: Lowella Grip [name] M.D.   On: 09/13/2020 08:00   DG Abd Portable 1V  Result Date: 09/12/2020 CLINICAL DATA:  NG tube in place.  Gastric outlet obstruction. EXAM: PORTABLE ABDOMEN - 1 VIEW COMPARISON:  Abdominal CTA 09/05/2020 FINDINGS: Tip and side port of the enteric tube are below the diaphragm in the stomach, enteric tube is coiled within the gastric body. Decreased gastric distension from prior CT. Generalized paucity of small bowel gas. Residual high-density contrast throughout the colon. Vascular calcifications are seen. IMPRESSION: Tip and side port of the enteric tube below the diaphragm in the stomach, enteric tube coiled within the gastric body. Electronically Signed   By: Keith Rake M.D.   On: 09/12/2020 23:28   [place] EKG SITE RITE  Result Date: 09/13/2020 If Site Rite image not attached, placement could not be confirmed due to current cardiac rhythm.

## 2020-09-22 NOTE — Progress Notes (Signed)
Physical Therapy Treatment Patient Details Name: Alexander [name] MRN: [id number] DOB: 1955-11-15 Today's Date: 09/22/2020    History of Present Illness 65yo male admitted 09/12/20 via transfer from [facility] secondary to gastic malignancy with gastric outlet obstruction. Received EGD and NGT 09/06/20. Pt underwent jejunostomy and gastrojejunostomy on 5/6 with J tube placement, unable to resect tumor. PMH COPD.    PT Comments    Pt demonstrates ability to perform transfers and ambulate limited community distances without physical assistance during session. Pt gait distances are limited by pain and activity tolerance at this time. Pt reports gait speed to be decreased compared to baseline. Pt will benefit from assessment of higher level gait/dynamic gait tasks at next session. Pt will continue to benefit from acute PT to address remaining deficits in activity tolerance and improve independent mobility.   Follow Up Recommendations  No PT follow up     Equipment Recommendations  None recommended by PT    Recommendations for Other Services       Precautions / Restrictions Precautions Precautions: Fall;Other (comment) (JP drain, J tube) Precaution Comments: J tube, JP drain, NG tube Restrictions Weight Bearing Restrictions: No    Mobility  Bed Mobility Overal bed mobility: Independent             General bed mobility comments: Pt in recliner upon arrival.    Transfers Overall transfer level: Independent Equipment used: None                Ambulation/Gait Ambulation/Gait assistance: Independent Social research officer, government (Feet): 250 Feet Assistive device: None;IV Pole Gait Pattern/deviations: Step-through pattern;Decreased stride length Gait velocity: decreased Gait velocity interpretation: >2.62 ft/sec, indicative of community ambulatory General Gait Details: Pt reports gait feels a little stiffer than usual and gait speed to be slower than baseline.   Stairs              Wheelchair Mobility    Modified Rankin (Stroke Patients Only)       Balance Overall balance assessment: Independent                                          Cognition Arousal/Alertness: Awake/alert Behavior During Therapy: WFL for tasks assessed/performed Overall Cognitive Status: Within Functional Limits for tasks assessed                                        Exercises      General Comments General comments (skin integrity, edema, etc.): BP after gait activties found to be 169/95. Pt denies any symptoms.      Pertinent Vitals/Pain Pain Assessment: Faces Faces Pain Scale: Hurts a little bit Pain Location: abdominal area Pain Descriptors / Indicators: Operative site guarding;Sore Pain Intervention(s): Monitored during session;Limited activity within patient's tolerance    Home Living                      Prior Function            PT Goals (current goals can now be found in the care plan section) Acute Rehab PT Goals Patient Stated Goal: Go home Progress towards PT goals: Progressing toward goals    Frequency    Min 1X/week      PT Plan Current plan remains appropriate  Co-evaluation              AM-PAC PT "6 Clicks" Mobility   Outcome Measure  Help needed turning from your back to your side while in a flat bed without using bedrails?: None Help needed moving from lying on your back to sitting on the side of a flat bed without using bedrails?: None Help needed moving to and from a bed to a chair (including a wheelchair)?: None Help needed standing up from a chair using your arms (e.g., wheelchair or bedside chair)?: None Help needed to walk in hospital room?: None Help needed climbing 3-5 steps with a railing? : None 6 Click Score: 24    End of Session   Activity Tolerance: Patient tolerated treatment well Patient left: in chair;with call bell/phone within reach;with family/visitor  present Nurse Communication: Mobility status PT Visit Diagnosis: Other abnormalities of gait and mobility (R26.89);Pain Pain - Right/Left:  (abdomen) Pain - part of body:  (abdomen)     Time: 6283-1517 PT Time Calculation (min) (ACUTE ONLY): 21 min  Charges:  $Therapeutic Activity: 8-22 mins                     Acute Rehab  Pager: [phone]    Garwin Brothers, SPT  09/22/2020, 5:03 PM

## 2020-09-22 NOTE — Progress Notes (Signed)
PHARMACY - TOTAL PARENTERAL NUTRITION CONSULT NOTE  Indication: Gastric outlet obstruction  Patient Measurements: Height: 5\' 2"  (157.5 cm) Weight: 77.2 kg (170 lb 3.1 oz) IBW/kg (Calculated) : 54.6 TPN AdjBW (KG): 60.2 Body mass index is 31.13 kg/m. Usual Weight: ~75kg per patient   Assessment:  65 yo M with PMH significant for GERD and now newly diagnosed gastric malignancy with planned bowel resection. Patient reports a normal diet (3 meals/day) PTA with no dietary restrictions and no unusual weight changes recently. He stopped eating regularly a few days PTA.  Pharmacy consulted to manage TPN.  Glucose / Insulin: no hx DM. SSI d/c'd 5/3 with patient not requiring insulin and CBGs controlled. AM BG 171 Electrolytes: all WNL  Renal: SCr < 1 stable, BUN up to 27 Hepatic: LFTs / Tbili / TG WNL. Albumin 2.7, prealbumin improved to 14.2 Intake / Output; MIVF: UOP 0.8 ml/kg/hr, drain output 40 ml/24hrs, no flatus or BM GI Imaging: 4/28 Abx Xray: enteric tube placed in stomach GI Surgeries / Procedures: 5/6 ex-lap, GJ bypass, J tube placed, placement of surgical drain  Central access: PICC 09/13/20 TPN start date: 09/13/20  Nutritional Goals: (per updated RD rec 5/4) 1900-2100 kCal, 100-120g AA, > 1.9L fluid per day  Current Nutrition:  TPN; NPO Plan to start trickle TF 5/8  Plan:  Continue TPN at goal rate 80 ml/hr per discussion with Surgery. TPN will provide 102g AA and 1962 kCal, meeting 100% of updated estimated needs Electrolytes in TPN: Na 146mEq/L, K 23mEq/L, Ca 70mEq/L, Mg 76mEq/L, Phos 41mmol/L, Cl:Ac 1:1 Add standard MVI and trace elements to TPN Add back moderate SSI q6h with BG up a bit post-op. Monitor CBGs and adjust as needed. Monitor standard TPN labs on Mon/Thurs F/u Surgery plans to start TF post-op and ability to wean TPN   Arturo Morton, PharmD, BCPS Please check AMION for all [place] contact numbers Clinical Pharmacist 09/22/2020 8:35 AM

## 2020-09-22 NOTE — Plan of Care (Signed)

## 2020-09-22 NOTE — Progress Notes (Signed)
2 Days Post-Op  Subjective: CC: Patient reports soreness around his incision.  He reports with PCA pain is a 5/10.  If he goes too long without using PCA it is 8/10.  He denies any nausea.  NG tube with 300 cc / 24 hours.  Drain serosanguineous, 20 cc / 24 hours.  No flatus or BM.  Got to edge of bed yesterday but did not mobilize.  Foley out and voiding.  Objective: Vital signs in last 24 hours: Temp:  [97.6 F (36.4 C)-98.6 F (37 C)] 97.6 F (36.4 C) (05/08 0749) Pulse Rate:  [64-87] 64 (05/08 0749) Resp:  [14-18] 17 (05/08 0749) BP: (111-153)/(70-101) 111/70 (05/08 0749) SpO2:  [90 %-95 %] 90 % (05/08 0749) FiO2 (%):  [0 %] 0 % (05/08 0707) Last BM Date: 09/15/20  Intake/Output from previous day: 05/07 0701 - 05/08 0700 In: -  Out: 1920 [Urine:1600; Emesis/NG output:300; Drains:20] Intake/Output this shift: No intake/output data recorded.  PE: Gen:  Alert, NAD, pleasant Card:  RRR Pulm:  CTAB, no W/R/R, effort normal. On o2 Abd: Soft, mild distension, hypoactive bowel sounds, appropriately tender around incision.  Midline wound with honeycomb dressing in place is clean dry intact.  Drain serosanguineous.  J-tube in place.  NG tube in place with scant output in canister Ext:  No LE edema  Psych: A&Ox3  Skin: no rashes noted, warm and dry  Lab Results:  Recent Labs    09/21/20 1002 09/22/20 0458  WBC 24.6* 21.8*  HGB 10.9* 9.1*  HCT 33.2* 25.8*  PLT 235 207   BMET Recent Labs    09/21/20 1002 09/22/20 0458  NA 136 138  K 4.5 4.2  CL 103 104  CO2 26 30  GLUCOSE 175* 171*  BUN 26* 27*  CREATININE 0.79 0.88  CALCIUM 8.9 8.8*   PT/INR No results for input(s): LABPROT, INR in the last 72 hours. CMP     Component Value Date/Time   NA 138 09/22/2020 0458   K 4.2 09/22/2020 0458   CL 104 09/22/2020 0458   CO2 30 09/22/2020 0458   GLUCOSE 171 (H) 09/22/2020 0458   BUN 27 (H) 09/22/2020 0458   CREATININE 0.88 09/22/2020 0458   CALCIUM 8.8 (L)  09/22/2020 0458   PROT 5.9 (L) 09/22/2020 0458   ALBUMIN 2.7 (L) 09/22/2020 0458   AST 27 09/22/2020 0458   ALT 43 09/22/2020 0458   ALKPHOS 67 09/22/2020 0458   BILITOT 0.4 09/22/2020 0458   GFRNONAA >60 09/22/2020 0458   Lipase  No results found for: LIPASE     Studies/Results: No results found.  Anti-infectives: Anti-infectives (From admission, onward)   Start     Dose/Rate Route Frequency Ordered Stop   09/20/20 1100  metroNIDAZOLE (FLAGYL) IVPB 500 mg        500 mg 100 mL/hr over 60 Minutes Intravenous To Short Stay 09/20/20 1049 09/20/20 1355   09/20/20 0600  ceFAZolin (ANCEF) IVPB 2g/100 mL premix        2 g 200 mL/hr over 30 Minutes Intravenous On call to O.R. 09/19/20 0940 09/20/20 1324       Assessment/Plan GOO from a gastric cancer at the antrum S/p ex lap, GJ bypass J tube place, placement of surgical drain - Dr. Zenia Resides - 09/20/20 - POD #2 - OR findings: Unresectable locally advanced tumor in the gastric antrum, invading the head of the pancreas. No evidence of metastatic disease within the abdomen.  -Continue PCA till able to  give meds through J tube -Will discuss with MD plan for starting trickle tube feeds through J-tube and possible clamping of NGT.  Await return of bowel function. Currently on TNA - Needs to mobilize.  PT ordered -Cont surgical drain -Pulm toilet  FEN - TPN, NGT, NPO, J-tube clamped, IVF per TRH VTE - SCDs, hep subq ID - Ance/flagylf periop Foley - out, voiding    LOS: 10 days    Jillyn Ledger , [facility] 09/22/2020, 8:49 AM Please see Amion for pager number during day hours 7:00am-4:30pm

## 2020-09-23 ENCOUNTER — Encounter (HOSPITAL_COMMUNITY): Payer: Self-pay | Admitting: Surgery

## 2020-09-23 DIAGNOSIS — D696 Thrombocytopenia, unspecified: Secondary | ICD-10-CM

## 2020-09-23 LAB — COMPREHENSIVE METABOLIC PANEL
ALT: 34 U/L (ref 0–44)
AST: 24 U/L (ref 15–41)
Albumin: 2.5 g/dL — ABNORMAL LOW (ref 3.5–5.0)
Alkaline Phosphatase: 69 U/L (ref 38–126)
Anion gap: 5 (ref 5–15)
BUN: 24 mg/dL — ABNORMAL HIGH (ref 8–23)
CO2: 30 mmol/L (ref 22–32)
Calcium: 8.9 mg/dL (ref 8.9–10.3)
Chloride: 107 mmol/L (ref 98–111)
Creatinine, Ser: 0.85 mg/dL (ref 0.61–1.24)
GFR, Estimated: >60 mL/min (ref >60)
Glucose, Bld: 154 mg/dL — ABNORMAL HIGH (ref 70–99)
Potassium: 3.6 mmol/L (ref 3.5–5.1)
Sodium: 142 mmol/L (ref 135–145)
Total Bilirubin: 0.4 mg/dL (ref 0.3–1.2)
Total Protein: 6.1 g/dL — ABNORMAL LOW (ref 6.5–8.1)

## 2020-09-23 LAB — DIFFERENTIAL
Abs Immature Granulocytes: 0.31 10*3/uL — ABNORMAL HIGH (ref 0.00–0.07)
Basophils Absolute: 0 10*3/uL (ref 0.0–0.1)
Basophils Relative: 0 %
Eosinophils Absolute: 0.3 10*3/uL (ref 0.0–0.5)
Eosinophils Relative: 2 %
Immature Granulocytes: 2 %
Lymphocytes Relative: 15 %
Lymphs Abs: 2.7 10*3/uL (ref 0.7–4.0)
Monocytes Absolute: 1.7 10*3/uL — ABNORMAL HIGH (ref 0.1–1.0)
Monocytes Relative: 9 %
Neutro Abs: 13.1 10*3/uL — ABNORMAL HIGH (ref 1.7–7.7)
Neutrophils Relative %: 72 %

## 2020-09-23 LAB — GLUCOSE, CAPILLARY
Glucose-Capillary: 164 mg/dL — ABNORMAL HIGH (ref 70–99)
Glucose-Capillary: 144 mg/dL — ABNORMAL HIGH (ref 70–99)
Glucose-Capillary: 144 mg/dL — ABNORMAL HIGH (ref 70–99)

## 2020-09-23 LAB — CBC
HCT: 27.4 % — ABNORMAL LOW (ref 39.0–52.0)
Hemoglobin: 9.3 g/dL — ABNORMAL LOW (ref 13.0–17.0)
MCH: 33 pg (ref 26.0–34.0)
MCHC: 33.9 g/dL (ref 30.0–36.0)
MCV: 97.2 fL (ref 80.0–100.0)
Platelets: 92 10*3/uL — ABNORMAL LOW (ref 150–400)
RBC: 2.82 MIL/uL — ABNORMAL LOW (ref 4.22–5.81)
RDW: 14.7 % (ref 11.5–15.5)
WBC: 18.1 10*3/uL — ABNORMAL HIGH (ref 4.0–10.5)
nRBC: 0.2 % (ref 0.0–0.2)

## 2020-09-23 LAB — MAGNESIUM: Magnesium: 2.1 mg/dL (ref 1.7–2.4)

## 2020-09-23 LAB — PREALBUMIN: Prealbumin: 9.4 mg/dL — ABNORMAL LOW (ref 18–38)

## 2020-09-23 LAB — PHOSPHORUS: Phosphorus: 2.9 mg/dL (ref 2.5–4.6)

## 2020-09-23 LAB — TRIGLYCERIDES: Triglycerides: 105 mg/dL (ref <150)

## 2020-09-23 MED ORDER — POTASSIUM CHLORIDE 20 MEQ PO PACK: 40.0000 meq | PACK | Freq: Once | ORAL | Status: DC

## 2020-09-23 MED ORDER — TRAVASOL 10 % IV SOLN
INTRAVENOUS | Status: AC
  Filled 2020-09-23: qty 1017.6

## 2020-09-23 NOTE — Progress Notes (Signed)
PROGRESS NOTE                                                                                                                                                                                                             Patient Demographics:    Alexander [name], is a 65 y.o. male, DOB - 1955/10/10, OAC:[id number]  Outpatient Primary MD for the patient is Serita Grammes, MD    LOS - 11  Admit date - 09/12/2020    No chief complaint on file.      Brief Narrative (HPI from H&P)    - Alexander [name] is a 65 y.o. male with medical history significant for COPD, HTN, HLD, GERD who was admitted from [facility] for management of newly diagnosed gastric malignancy with gastric outlet obstruction, he had work-up at [facility] which included EGD and biopsy showing gastric adenocarcinoma, NG tube was placed and he was transferred to [facility] for further treatment.   Subjective:   Patient with nausea after clamping his NG tube yesterday, this has resolved after he was resumed on suction, reports pain is controlled, reports he is compliant with incentive spirometer.   Assessment  & Plan :   Gastric outlet obstruction caused by gastric adenocarcinoma as diagnosed with biopsy the hospital. -Management per general surgery, initially kept n.p.o., on TPN via PEG, given poor nutritional status , prealbumin significantly improved from 7 > 14, patient went for  ex lap, GJ bypass J tube place, placement of surgical drain  By  Dr. Zenia Resides on  09/20/20, as he had unresectable locally advanced tumor in the gastric antrum, invading the head of the pancreas, with no evidence of metastatic disease within the abdomen. -Postoperative management per general surgery, he is with nausea, so G-tube feeding remains on hold, now back on NGT suction with resolution of her nausea.   -Ecology input greatly appreciated, likely plan for chemotherapy as  an outpatient, Dr. Marin Olp to arrange for it to be done in [place]. -Leukocytosis most likely stress related from surgery, trending down, he is afebrile, and nontoxic-appearing.  HTN.  Blood pressure acceptable on Catapres patch and Nitropaste, continue with as needed IV labetalol and hydralazine.  Upper GI bleed caused by gastric ulcer at [facility].  Resolved after IV PPI.  Type screen and monitor.   Hypokalemia -to be  managed with TPN  Thrombocytopenia -We will check HIT, will hold subcu heparin       Condition - Extremely Guarded  Family Communication  : none  at bedside  Code Status :  Full  Consults  :  CCS, Oncology  Procedures"  S/p ex lap, GJ bypass J tube place, placement of surgical drain - Dr. Zenia Resides - 09/20/20  PUD Prophylaxis : PPI   Procedures  :      R. Arm PICC 09/13/20   ex lap, GJ bypass J tube place, placement of surgical drain  By  Dr. Zenia Resides on  09/20/20,      Disposition Plan  :    Status is: Inpatient  Remains inpatient appropriate because:IV treatments appropriate due to intensity of illness or inability to take PO   Dispo: The patient is from: Home              Anticipated d/c is to: Home              Patient currently is not medically stable to d/c.   Difficult to place patient No   DVT Prophylaxis  :    SCDs Start: 09/12/20 2127    Lab Results  Component Value Date   PLT 92 (L) 09/23/2020    Diet :  Diet Order            Diet NPO time specified  Diet effective now                  Inpatient Medications  Scheduled Meds:  . Chlorhexidine Gluconate Cloth  6 each Topical Daily  . cloNIDine  0.3 mg Transdermal Q Fri  . HYDROmorphone   Intravenous Q4H  . insulin aspart  0-15 Units Subcutaneous Q6H  . nitroGLYCERIN  1 inch Topical Q6H  . pantoprazole (PROTONIX) IV  40 mg Intravenous Q12H  . pneumococcal 23 valent vaccine  0.5 mL Intramuscular Tomorrow-1000  . potassium chloride  40 mEq Per Tube Once   Continuous  Infusions:  . TPN ADULT (ION) 80 mL/hr at 09/22/20 1723  . TPN ADULT (ION)     PRN Meds:.acetaminophen **OR** acetaminophen, artificial tears, diphenhydrAMINE, hydrALAZINE, labetalol, naloxone **AND** sodium chloride flush, [DISCONTINUED] ondansetron **OR** ondansetron (ZOFRAN) IV, sodium chloride flush  Antibiotics  :    Anti-infectives (From admission, onward)   Start     Dose/Rate Route Frequency Ordered Stop   09/20/20 1100  metroNIDAZOLE (FLAGYL) IVPB 500 mg        500 mg 100 mL/hr over 60 Minutes Intravenous To Short Stay 09/20/20 1049 09/20/20 1355   09/20/20 0600  ceFAZolin (ANCEF) IVPB 2g/100 mL premix        2 g 200 mL/hr over 30 Minutes Intravenous On call to O.R. 09/19/20 0940 09/20/20 [place] Marwah Disbro M.D on 09/23/2020 at 3:38 PM  To page go to [email or web address]   [facility] -  Office  [phone]    See all Orders from today for further details    Objective:   Vitals:   09/23/20 0710 09/23/20 0807 09/23/20 1151 09/23/20 1200  BP:  (!) 148/88  140/86  Pulse:  83  82  Resp: 18 18 18 18   Temp:  98 F (36.7 C)  98.4 F (36.9 C)  TempSrc:  Oral  Oral  SpO2: 95% 96% 96% 95%  Weight:      Height:        Wt Readings from Last 3 Encounters:  09/13/20 77.2 kg  08/20/20 81.4 kg     Intake/Output Summary (Last 24 hours) at 09/23/2020 1538 Last data filed at 09/23/2020 1300 Gross per 24 hour  Intake 1964.52 ml  Output 1555 ml  Net 409.52 ml     Physical Exam  Awake Alert, Oriented X 3, No new F.N deficits, Normal affect Symmetrical Chest wall movement, Good air movement bilaterally, CTAB RRR,No Gallops,Rubs or new Murmurs, No Parasternal Heave Diminished bowel sounds, midline surgical mesh with no significant oozing or discharge, left J-tube, right JP drain. No Cyanosis, Clubbing or edema, No new Rash or bruise         Data Review:    CBC Recent Labs  Lab 09/17/20 0342 09/18/20 0207 09/19/20 0456 09/20/20 0259  09/21/20 1002 09/22/20 0458 09/23/20 0409  WBC 13.1* 13.3* 13.4* 15.5* 24.6* 21.8* 18.1*  HGB 12.1* 11.7* 11.2* 11.1* 10.9* 9.1* 9.3*  HCT 33.6* 35.4* 35.2* 33.8* 33.2* 25.8* 27.4*  PLT 335 331 309 297 235 207 92*  MCV 93.1 91.0 95.1 95.5 94.9 98.5 97.2  MCH 33.5 30.1 30.3 31.4 31.1 34.7* 33.0  MCHC 36.0 33.1 31.8 32.8 32.8 35.3 33.9  RDW 14.1 14.4 14.4 14.2 14.2 14.6 14.7  LYMPHSABS 2.9 3.9 3.7 3.3  --   --  2.7  MONOABS 1.7* 1.4* 1.5* 1.6*  --   --  1.7*  EOSABS 0.6* 0.6* 0.6* 0.5  --   --  0.3  BASOSABS 0.1 0.1 0.1 0.1  --   --  0.0    Recent Labs  Lab 09/17/20 0342 09/18/20 0207 09/19/20 0456 09/20/20 0259 09/21/20 1002 09/22/20 0458 09/23/20 0409  NA 134* 135 137 135 136 138 142  K 4.4 4.3 4.4 4.3 4.5 4.2 3.6  CL 100 100 101 101 103 104 107  CO2 26 27 30 28 26 30 30   GLUCOSE 137* 141* 125* 115* 175* 171* 154*  BUN 23 26* 27* 27* 26* 27* 24*  CREATININE 0.85 0.88 0.91 0.90 0.79 0.88 0.85  CALCIUM 9.1 9.1 9.1 9.1 8.9 8.8* 8.9  AST 29 33 39 40 36 27 24  ALT 41 44 54* 58* 55* 43 34  ALKPHOS 59 62 63 64 67 67 69  BILITOT 0.5 0.5 0.5 0.5 0.6 0.4 0.4  ALBUMIN 2.8* 2.8* 2.8* 2.9* 2.7* 2.7* 2.5*  MG 2.1 2.1 2.1 2.0  --   --  2.1  BNP 19.9 17.9 15.0 16.7  --   --   --     ------------------------------------------------------------------------------------------------------------------ Recent Labs    09/23/20 0409  TRIG 105    Lab Results  Component Value Date   HGBA1C 5.6 09/13/2020   ------------------------------------------------------------------------------------------------------------------ No results for input(s): TSH, T4TOTAL, T3FREE, THYROIDAB in the last 72 hours.  Invalid input(s): FREET3  Cardiac Enzymes No results for input(s): CKMB, TROPONINI, MYOGLOBIN in the last 168 hours.  Invalid input(s): CK ------------------------------------------------------------------------------------------------------------------    Component Value Date/Time    BNP 16.7 09/20/2020 0259    Micro Results No results found for this or any previous visit (from the past 240 hour(s)).  Radiology Reports DG Abd 1 View  Result Date: 09/17/2020 CLINICAL DATA:  Status post nasogastric tube placement EXAM: ABDOMEN - 1 VIEW COMPARISON:  September 12, 2020 FINDINGS: Nasogastric tube with tip and side port overlying the stomach. The bowel gas pattern is normal. No radio-opaque calculi or other significant radiographic abnormality are seen. IMPRESSION: Nasogastric tube with tip and side port overlying the stomach. Electronically Signed   By: Dellis Filbert  Nance Pew MD   On: 09/17/2020 13:07   DG Chest Port 1 View  Result Date: 09/13/2020 CLINICAL DATA:  Cough and shortness of breath EXAM: PORTABLE CHEST 1 VIEW COMPARISON:  Chest CT September 05, 2020 FINDINGS: Nasogastric tube tip and side port in stomach. No edema or airspace opacity. Heart size and pulmonary vascularity are normal. No adenopathy. No bone lesions. IMPRESSION: Nasogastric tube tip and side port in stomach. Lungs clear. Cardiac silhouette within normal limits. Electronically Signed   By: Lowella Grip [name] M.D.   On: 09/13/2020 08:00   DG Abd Portable 1V  Result Date: 09/12/2020 CLINICAL DATA:  NG tube in place.  Gastric outlet obstruction. EXAM: PORTABLE ABDOMEN - 1 VIEW COMPARISON:  Abdominal CTA 09/05/2020 FINDINGS: Tip and side port of the enteric tube are below the diaphragm in the stomach, enteric tube is coiled within the gastric body. Decreased gastric distension from prior CT. Generalized paucity of small bowel gas. Residual high-density contrast throughout the colon. Vascular calcifications are seen. IMPRESSION: Tip and side port of the enteric tube below the diaphragm in the stomach, enteric tube coiled within the gastric body. Electronically Signed   By: Keith Rake M.D.   On: 09/12/2020 23:28   [place] EKG SITE RITE  Result Date: 09/13/2020 If Site Rite image not attached, placement could not be  confirmed due to current cardiac rhythm.

## 2020-09-23 NOTE — Progress Notes (Signed)
Unfortunately, when he went to have surgery, the surgeon found that the tumor was locally advanced and had abated to the pancreas.  As such, this is a nonresectable malignancy.  I really hate this.  I thought that he would be able to be resected and have a much higher chance of cure.  Now, we are going to have to think about aggressive therapy for him.  He is young.  He is in great shape.  He really would be able to tolerate aggressive therapy.  I would favor a "sandwich approach" where we would give different folders chemotherapy followed by radiation and chemotherapy that is directed toward the pancreatic area.  After this, I would then go with full dose chemotherapy.  He lives down in [place].  He can be treated down in [place] as this will be somewhat easier for him.  I will make that arrangement.  He has a TNA going.  He still not eating regular food.  I would like to think that he will be on tube feeds soon.  His labs show white cell count of 18.1.  Hemoglobin 9.3.  Platelet count 92,000.  The blood count is on the lower side.  I am not sure as to why this would be although might be medication related.  His BUN is 24 creatinine 0.85.  His albumin is 2.5.  Prealbumin is 9.4.  I will think that he will still be in the hospital for several days.  Hopefully he will be able to start tube feeds.  We will have to see how his platelet count trends.  Again I had to believe this is medication related.  His platelet count yesterday was 207,000.  He is on heparin injections.  He has been on these since April.  I would think that HITT would be a little bit unusual.  However, I would watch this closely.  I know that he is getting outstanding care from staff up on 5 W.    I just wonder was all the nurses up on [address]. Happy Nurses Week!!!!  Lattie Haw, MD  Michaelyn Barter 6:11

## 2020-09-23 NOTE — Op Note (Signed)
Date: 09/23/20  Patient: Alexander [name] MRN: [id number]  Preoperative Diagnosis: Gastric adenocarcinoma Postoperative Diagnosis: Gastric adenocarcinoma with invasion of the pancreatic head  Procedure: Exploratory laparotomy, gastrojejunal bypass, placement of feeding jejunostomy tube  Surgeon: Michaelle Birks, MD Assistant: Rolm Bookbinder, MD  EBL: Minimal  Anesthesia: General  Specimens: None  Indications: [name] is a 65 yo male who presented with a gastric outlet obstruction and was found to have an ulcerated mass in the pyloric channel. Biopsies confirmed adenocarcinoma and staging scans did not show any evidence of metastatic disease. He was started on TPN and agreed to undergo upfront surgical resection of his cancer.  Findings: Bulky mass at the pylorus of the stomach with invasion into the head of the pancreas, deemed unresectable. Palliative GJ bypass was performed to relieve the gastric outlet obstruction and a feeding jejunostomy tube was placed.  Procedure details: Informed consent was obtained in the preoperative area prior to the procedure. The patient was brought to the operating room and placed on the table in the supine position. General anesthesia was induced and appropriate lines and drains were placed for intraoperative monitoring. Perioperative antibiotics were administered per SCIP guidelines. The abdomen was prepped and draped in the usual sterile fashion. A pre-procedure timeout was taken verifying patient identity, surgical site and procedure to be performed.  An upper midline skin incision was made and the subcutaneous tissue was divided with cautery to expose the fascia. The fascia was incised and opened along the linea alba and the peritoneal cavity was entered. The falciform ligament was ligated with 2-0 silk ties and divided. The peritoneum was palpated and there were no nodules. The liver was palpated and appeared free of disease. A bulky, immobile  mass was immediately evident at the gastric pylorus. The gastrocolic omentum was taken down off the colon, starting at the hepatic flexure and working across the transverse colon. Once the lesser sac was entered, the posterior stomach was lifted off the pancreas. Posteriorly the pylorus appeared densely adherent to the head of the pancreas. The pars flaccida was then opened. The common hepatic artery was palpated and felt soft and free of disease. The station 8 lymph node felt soft. The right gastric artery was identified, and was ligated with 2-0 silk ties close to its origin and divided. Attempts were then made to circumferentially dissect out the pylorus. The gastroepiploic vessels were ligated with 2-0 silk ties and divided, near the juncture with the middle colic vessels. Dissection of the posterior pylorus off the pancreas was attempted and was very tedious. It became clear that the mass was involving the head of the pancreas, and a clear margin could not be obtained without resecting the head of the pancreas. Thus the resection was aborted and we decided to perform gastrojejunal bypass and feeding jejunostomy tube.  A loop of jejunum approximately 40cm distal to the ligament of Treitz was brought up to the posterior stomach. A gastrotomy was created on the posterior body of the stomach near the greater curve using cautery, and an enterotomy was created on the jejunum. A side-to-side gastrojejunal anastomosis was created with a GIA 35mm blue stapler. The common enterotomy was closed with a TA60 blue stapler. The anastomosis was widely patent. Next a point on the jejunum approximately 30cm distal to the [place] anastomosis was selected for the feeding tube. A 3-0 silk pursestring suture was placed and an enterotomy was created at the center of this. A 16-Fr J tube was passed through the left  abdominal wall and the end inserted into the jejunum. The end was milked distally into the jejunum until the balloon was  inside the bowel. The pursestring was tied down to secure the tube. A Witzel tunnel was created with interrupted 3-0 silk suture. The jejunum was then pexied to the abdominal wall with 2-0 silk suture. The surgical site was inspected and appeared hemostatic. A 19-Fr JP drain was left adjacent to the head of the pancreas in case of a pancreatic leak. It was brought out through the right abdominal wall and secured to the skin with a 2-0 Nylon suture. The fascia was closed with a running looped 1 PDS suture. The skin was closed with staples and a sterile dressing was placed.   The patient tolerated the procedure with no apparent complications. All counts were correct x2 at the end of the procedure. The patient was extubated and taken to PACU in stable condition.  Michaelle Birks, MD 09/23/20 4:13 PM

## 2020-09-23 NOTE — Anesthesia Postprocedure Evaluation (Signed)
Anesthesia Post Note  Patient: Alexander [name]  Procedure(s) Performed: FEEDING JEJUNOSTOMY (N/A Abdomen) GASTROJEJUNOSTOMY     Patient location during evaluation: PACU Anesthesia Type: General Level of consciousness: awake and alert and oriented Pain management: pain level controlled Vital Signs Assessment: post-procedure vital signs reviewed and stable Respiratory status: spontaneous breathing, nonlabored ventilation and respiratory function stable Cardiovascular status: blood pressure returned to baseline Postop Assessment: no apparent nausea or vomiting Anesthetic complications: no   No complications documented.           Brennan Bailey

## 2020-09-23 NOTE — Progress Notes (Signed)
Nutrition Follow-up  DOCUMENTATION CODES:   Obesity unspecified  INTERVENTION:   -TPN management per pharmacy -Will follow for ability to start EN. Recommend:  Initiate Osmolite 1.5 @ 10 ml/hr via j-yube and increase by 10 ml every 12 hours to goal rate of 55 ml/hr.   45 ml Prosource TF BID.    Tube feeding regimen provides 2060 kcal (100% of needs), 105 grams of protein, and 1006 ml of H2O.   NUTRITION DIAGNOSIS:   Increased nutrient needs related to chronic illness (gastric cancer) as evidenced by estimated needs.  Ongoing  GOAL:   Patient will meet greater than or equal to 90% of their needs  Met with TPN  MONITOR:   Diet advancement,Labs,Weight trends,Skin,I & O's  REASON FOR ASSESSMENT:   Consult Enteral/tube feeding initiation and management  ASSESSMENT:   Alexander [name] is a 65 y.o. male with medical history significant for COPD, HTN, HLD, GERD who was admitted from [facility] for management of newly diagnosed gastric malignancy with gastric outlet obstruction, he had work-up at [facility] which included EGD and biopsy showing gastric adenocarcinoma, NG tube was placed and he was transferred to [facility] for further treatment.  4/22- NGT placed, connected to low, intermittent suction 4/29- PICC placed, TPN initiated 5/6- s/p Procedure(s): FEEDING JEJUNOSTOMY (N/A) GASTROJEJUNOSTOMY; NGT replaced- connected to low, intermittent suction  Reviewed I/O's: +1.2 L x 24 hours and -128 ml since admission  UOP: 500 ml x 24 hours  NGT output: 400 ml x 24 hours  Drain output: 30 ml x 24 hours  Per general surgery notes, OR findings reveal "Unresectable locally advanced tumor in the gastric antrum, invading the head of the pancreas. No evidence of metastatic disease within the abdomen".   Per general surgery notes, trickle TF at 10 ml/hr were initiated yesterday, however, pt did not tolerate (felt bloated with bad taste in his mouth and NGT  was re-connected to intermittent suction). Plan to await return in bowel function prior to re-starting feeds.   Pt continues to receive TPN at 80 ml/hr. Regimen provides 1962 kcals and 102 grams protein, meeting 100% of estimated kcal and protein needs.  Labs reviewed: CBGS: 146-165 (inpatient orders for glycemic control are 0-15 units insulin aspart every 6 hours).   Diet Order:   Diet Order            Diet NPO time specified  Diet effective now                 EDUCATION NEEDS:   Education needs have been addressed  Skin:  Skin Assessment: Skin Integrity Issues: Skin Integrity Issues:: Incisions Incisions: closed abdomen  Last BM:  09/15/20  Height:   Ht Readings from Last 1 Encounters:  09/13/20 5' 2" (1.575 m)    Weight:   Wt Readings from Last 1 Encounters:  09/13/20 77.2 kg    Ideal Body Weight:  53.6 kg  BMI:  Body mass index is 31.13 kg/m.  Estimated Nutritional Needs:   Kcal:  1900-2100  Protein:  100-120 gm  Fluid:  > 1.9 L    Loistine Chance, RD, LDN, [place] Registered Dietitian II Certified Diabetes Care and Education Specialist Please refer to [facility] for RD and/or RD on-call/weekend/after hours pager

## 2020-09-23 NOTE — Progress Notes (Signed)
Pt called out saying that he was feeling "bloated" and had a bad taste in his mouth. Pt maintained that he wasn't having pain or nausea, but his abdomen did feel distended. NG tube was then hooked back up to LIWS and produced approximately 217mL of green-black colored drainage. NG tube continued on LIWS. Will cont to monitor.

## 2020-09-23 NOTE — Progress Notes (Signed)
3 Days Post-Op  Subjective: CC: Patients NG tube was clamped yesterday and he was started on trickle tube feeds.  He reports around 1130 last night he began having hypersalivation, nausea, bloating and increased upper abdominal pain.  Tube feeds were held and NG tube rehab to LIWS.  All of his symptoms resolved.  He is having some soreness around his incision today that is well controlled with PCA.  Voiding.  No flatus or BM.  Mobilized with PT yesterday who recommended no follow-up.  Seen by Dr. Marin Olp today.  Objective: Vital signs in last 24 hours: Temp:  [97.8 F (36.6 C)-98.6 F (37 C)] 98 F (36.7 C) (05/09 0807) Pulse Rate:  [80-89] 83 (05/09 0807) Resp:  [16-18] 18 (05/09 0807) BP: (128-165)/(75-98) 148/88 (05/09 0807) SpO2:  [91 %-96 %] 96 % (05/09 0807) Last BM Date: 09/15/20  Intake/Output from previous day: 05/08 0701 - 05/09 0700 In: 2164.5 [P.O.:240; I.V.:1924.5] Out: 62 [Urine:500; Emesis/NG output:400; Drains:30] Intake/Output this shift: Total I/O In: -  Out: 225 [Urine:225]  PE: Gen: Alert, NAD, pleasant Card: RRR Pulm: CTAB, no W/R/R, effort normal. On o2 Abd: Soft,mild distension, hypoactive bowel sounds,appropriately tender around incision. Midline wound with honeycomb dressing in place is clean dry intact. Drain serosanguineous. J-tube in place. NG tube in place with 400cc output in canister Ext: No LE edema Psych: A&Ox3  Skin: no rashes noted, warm and dry  Lab Results:  Recent Labs    09/22/20 0458 09/23/20 0409  WBC 21.8* 18.1*  HGB 9.1* 9.3*  HCT 25.8* 27.4*  PLT 207 92*   BMET Recent Labs    09/22/20 0458 09/23/20 0409  NA 138 142  K 4.2 3.6  CL 104 107  CO2 30 30  GLUCOSE 171* 154*  BUN 27* 24*  CREATININE 0.88 0.85  CALCIUM 8.8* 8.9   PT/INR No results for input(s): LABPROT, INR in the last 72 hours. CMP     Component Value Date/Time   NA 142 09/23/2020 0409   K 3.6 09/23/2020 0409   CL 107 09/23/2020  0409   CO2 30 09/23/2020 0409   GLUCOSE 154 (H) 09/23/2020 0409   BUN 24 (H) 09/23/2020 0409   CREATININE 0.85 09/23/2020 0409   CALCIUM 8.9 09/23/2020 0409   PROT 6.1 (L) 09/23/2020 0409   ALBUMIN 2.5 (L) 09/23/2020 0409   AST 24 09/23/2020 0409   ALT 34 09/23/2020 0409   ALKPHOS 69 09/23/2020 0409   BILITOT 0.4 09/23/2020 0409   GFRNONAA >60 09/23/2020 0409   Lipase  No results found for: LIPASE     Studies/Results: No results found.  Anti-infectives: Anti-infectives (From admission, onward)   Start     Dose/Rate Route Frequency Ordered Stop   09/20/20 1100  metroNIDAZOLE (FLAGYL) IVPB 500 mg        500 mg 100 mL/hr over 60 Minutes Intravenous To Short Stay 09/20/20 1049 09/20/20 1355   09/20/20 0600  ceFAZolin (ANCEF) IVPB 2g/100 mL premix        2 g 200 mL/hr over 30 Minutes Intravenous On call to O.R. 09/19/20 0940 09/20/20 1324       Assessment/Plan GOO from a gastric cancer at the antrum S/p ex lap,GJ bypass J tubeplace, placement of surgical drain - Dr. Zenia Resides - 09/20/20 - POD #3 -OR findings:Unresectable locally advanced tumor in the gastric antrum, invading the head of the pancreas. No evidence of metastatic disease within the abdomen.  - Continue PCA till able to give meds  through J tube - Cont TNA - Did not tolerate trickle tube feeds and NG tube clamping yesterday.  Await return of bowel function before another trial -Mobilize. PT rec no follow up - Onc following - Dr. Marin Olp - Cont surgical drain (was placed around pancreas) - Pulm toilet  FEN -TPN, NGT, NPO, J-tube clamped, IVF per TRH VTE -SCDs, hep subq ID -Ance/flagylf periop Foley - out, voiding    LOS: 11 days    Jillyn Ledger , [facility] 09/23/2020, 9:22 AM Please see Amion for pager number during day hours 7:00am-4:30pm

## 2020-09-23 NOTE — Progress Notes (Signed)
PHARMACY - TOTAL PARENTERAL NUTRITION CONSULT NOTE  Indication: Gastric outlet obstruction  Patient Measurements: Height: 5\' 2"  (157.5 cm) Weight: 77.2 kg (170 lb 3.1 oz) IBW/kg (Calculated) : 54.6 TPN AdjBW (KG): 60.2 Body mass index is 31.13 kg/m. Usual Weight: ~75kg per patient   Assessment:  65 yo M with PMH significant for GERD and now newly diagnosed gastric malignancy with planned bowel resection. Patient reports a normal diet (3 meals/day) PTA with no dietary restrictions and no unusual weight changes recently. He stopped eating regularly a few days PTA.  Pharmacy consulted to manage TPN.  Glucose / Insulin: no hx DM. SSI d/c'd 5/3 with patient not requiring insulin and CBGs controlled. SSI restarted 5/8 post-op >> used 8 units yesterday.  CBGs < 180 Electrolytes: all WNL (K and Phos low normal) Renal: SCr < 1 stable, BUN up to 27 Hepatic: LFTs / Tbili / TG WNL. Albumin 2.5.  Prealbumin down to 9.4, likely d/t stress. Intake / Output; MIVF: UOP 0.3 ml/kg/hr, NG 442mL, drain 36mL GI Imaging:  4/28 Abx Xray: enteric tube placed in stomach GI Surgeries / Procedures:  5/6 ex-lap, GJ bypass, J tube placed, placement of surgical drain  Central access: PICC 09/13/20 TPN start date: 09/13/20  Nutritional Goals: (per updated RD rec 5/4) 1900-2100 kCal, 100-120g AA, > 1.9L fluid per day  Current Nutrition:  TPN  Plan:  Continue TPN at goal rate 80 ml/hr to provide 102g AA and 1962 kCal, meeting 100% of estimated needs Electrolytes in TPN: Na 158mEq/L, incr K 61mEq/L, Ca 10mEq/L, Mg 22mEq/L, incr Phos 50mmol/L, Cl:Ac 1:1 Add standard MVI and trace elements to TPN Continue moderate SSI Q6H for now KCL 76mEq PT x 1 Standard TPN labs on Mon/Thurs F/u with starting TF   Jazara Swiney D. Mina Marble, PharmD, BCPS, [place] 09/23/2020, 9:09 AM

## 2020-09-24 LAB — GLUCOSE, CAPILLARY
Glucose-Capillary: 120 mg/dL — ABNORMAL HIGH (ref 70–99)
Glucose-Capillary: 128 mg/dL — ABNORMAL HIGH (ref 70–99)
Glucose-Capillary: 133 mg/dL — ABNORMAL HIGH (ref 70–99)
Glucose-Capillary: 134 mg/dL — ABNORMAL HIGH (ref 70–99)
Glucose-Capillary: 134 mg/dL — ABNORMAL HIGH (ref 70–99)
Glucose-Capillary: 148 mg/dL — ABNORMAL HIGH (ref 70–99)

## 2020-09-24 LAB — CBC
HCT: 29.6 % — ABNORMAL LOW (ref 39.0–52.0)
Hemoglobin: 9.6 g/dL — ABNORMAL LOW (ref 13.0–17.0)
MCH: 31.2 pg (ref 26.0–34.0)
MCHC: 32.4 g/dL (ref 30.0–36.0)
MCV: 96.1 fL (ref 80.0–100.0)
Platelets: 214 10*3/uL (ref 150–400)
RBC: 3.08 MIL/uL — ABNORMAL LOW (ref 4.22–5.81)
RDW: 15.1 % (ref 11.5–15.5)
WBC: 14.2 10*3/uL — ABNORMAL HIGH (ref 4.0–10.5)
nRBC: 0 % (ref 0.0–0.2)

## 2020-09-24 LAB — BASIC METABOLIC PANEL
Anion gap: 8 (ref 5–15)
BUN: 21 mg/dL (ref 8–23)
CO2: 27 mmol/L (ref 22–32)
Calcium: 9.2 mg/dL (ref 8.9–10.3)
Chloride: 108 mmol/L (ref 98–111)
Creatinine, Ser: 0.82 mg/dL (ref 0.61–1.24)
GFR, Estimated: >60 mL/min (ref >60)
Glucose, Bld: 143 mg/dL — ABNORMAL HIGH (ref 70–99)
Potassium: 3.9 mmol/L (ref 3.5–5.1)
Sodium: 143 mmol/L (ref 135–145)

## 2020-09-24 LAB — HEPARIN INDUCED PLATELET AB (HIT ANTIBODY): Heparin Induced Plt Ab: 0.216 OD (ref 0.000–0.400)

## 2020-09-24 MED ORDER — VITAL HIGH PROTEIN PO LIQD: 1000.0000 mL | ORAL | Status: DC

## 2020-09-24 MED ORDER — TRAVASOL 10 % IV SOLN
INTRAVENOUS | Status: AC
  Filled 2020-09-24: qty 1017.6

## 2020-09-24 MED ORDER — OSMOLITE 1.5 CAL PO LIQD
1000.0000 mL | ORAL | Status: DC
  Administered 2020-09-24 – 2020-09-27 (×4): 1000 mL
  Filled 2020-09-24 (×6): qty 1000

## 2020-09-24 MED ORDER — PROSOURCE TF PO LIQD: 45.0000 mL | Freq: Two times a day (BID) | ORAL | Status: DC

## 2020-09-24 MED ORDER — PROSOURCE TF PO LIQD
45.0000 mL | Freq: Two times a day (BID) | ORAL | Status: DC
  Administered 2020-09-25 – 2020-09-28 (×7): 45 mL
  Filled 2020-09-24 (×9): qty 45

## 2020-09-24 MED ORDER — HEPARIN SODIUM (PORCINE) 5000 UNIT/ML IJ SOLN
5000.0000 [IU] | Freq: Three times a day (TID) | INTRAMUSCULAR | Status: DC
  Administered 2020-09-24 – 2020-10-02 (×22): 5000 [IU] via SUBCUTANEOUS
  Filled 2020-09-24 (×23): qty 1

## 2020-09-24 NOTE — Progress Notes (Signed)
PHARMACY - TOTAL PARENTERAL NUTRITION CONSULT NOTE  Indication: Gastric outlet obstruction  Patient Measurements: Height: 5\' 2"  (157.5 cm) Weight: 77.2 kg (170 lb 3.1 oz) IBW/kg (Calculated) : 54.6 TPN AdjBW (KG): 60.2 Body mass index is 31.13 kg/m. Usual Weight: ~75kg per patient   Assessment:  65 yo M with PMH significant for GERD and now newly diagnosed gastric malignancy with planned bowel resection. Patient reports a normal diet (3 meals/day) PTA with no dietary restrictions and no unusual weight changes recently. He stopped eating regularly a few days PTA.  Pharmacy consulted to manage TPN.  Glucose / Insulin: no hx DM. SSI d/c'd 5/3 with patient not requiring insulin and CBGs controlled. SSI restarted 5/8 post-op >> used 7 units yesterday.  CBGs < 180 Electrolytes: all WNL Renal: SCr < 1 stable, BUN WNL Hepatic: LFTs / Tbili / TG WNL. Albumin 2.5.  Prealbumin down to 9.4, likely d/t stress. Intake / Output; MIVF: UOP 1.1 ml/kg/hr, NG 820mL, drain 36mL GI Imaging:  4/28 Abx Xray: enteric tube placed in stomach GI Surgeries / Procedures:  5/6 ex-lap, GJ bypass, J tube placed, placement of surgical drain  Central access: PICC 09/13/20 TPN start date: 09/13/20  Nutritional Goals: (per updated RD rec 5/9) 1900-2100 kCal, 100-120g AA, > 1.9L fluid per day  Current Nutrition:  TPN  Plan:  Continue TPN at goal rate 80 ml/hr to provide 102g AA and 1962 kCal, meeting 100% of estimated needs Electrolytes in TPN: Na 179mEq/L, incr K 87mEq/L on 5/9, Ca 98mEq/L, Mg 74mEq/L, incr Phos 37mmol/L on 5/9, Cl:Ac 1:1 - no change today Add standard MVI and trace elements to TPN Continue moderate SSI Q6H  Standard TPN labs on Mon/Thurs Surgery considering TF  Alexander [name], PharmD, BCPS, [place] 09/24/2020, 10:01 AM

## 2020-09-24 NOTE — Progress Notes (Signed)
Changed out pts PCA syringe and wasted 1.31mL with Hulan Amato, RN in stericycle in [place].

## 2020-09-24 NOTE — Progress Notes (Addendum)
PROGRESS NOTE                                                                                                                                                                                                             Patient Demographics:    Alexander [name], is a 65 y.o. male, DOB - 01-Jan-1956, YV:[id number]  Outpatient Primary MD for the patient is Serita Grammes, MD    LOS - 12  Admit date - 09/12/2020    No chief complaint on file.      Brief Narrative (HPI from H&P)    - Alexander [name] is a 65 y.o. male with medical history significant for COPD, HTN, HLD, GERD who was admitted from [facility] for management of newly diagnosed gastric malignancy with gastric outlet obstruction, he had work-up at [facility] which included EGD and biopsy showing gastric adenocarcinoma, NG tube was placed and he was transferred to [facility] for further treatment.  He is with mom nutrition initially, kept on TPN, attritional status has improved, patient went for  ex lap, GJ bypass J tube place, placement of surgical drain  By  Dr. Zenia Resides on  09/20/20, as he had unresectable locally advanced tumor in the gastric antrum, invading the head of the pancreas, with no evidence of metastatic disease within the abdomen.  He is currently with postoperative management by general surgery, remains on TPN, NGT suction, NPO.    Subjective:   Patient denies any nausea or vomiting once back on NGT suction, abdominal pain is controlled .    Assessment  & Plan :   Gastric outlet obstruction caused by gastric adenocarcinoma as diagnosed with biopsy the hospital. -Management per general surgery, initially kept n.p.o., on TPN via PEG, given poor nutritional status , prealbumin significantly improved from 7 > 14, patient went for  ex lap, GJ bypass J tube place, placement of surgical drain  By  Dr. Zenia Resides on  09/20/20, as he had unresectable  locally advanced tumor in the gastric antrum, invading the head of the pancreas, with no evidence of metastatic disease within the abdomen. -Postoperative management per general surgery, currently no nausea after NGT back on suction, will try trickle J-tube feeds at 10 cc today . -Oncology input greatly appreciated, likely plan for chemotherapy as an outpatient, Dr. Marin Olp to arrange for it  to be done in [place]. -Leukocytosis most likely stress related from surgery, trending down, he is afebrile, and nontoxic-appearing.  HTN.  Blood pressure acceptable on Catapres patch and Nitropaste, continue with as needed IV labetalol and hydralazine.  Upper GI bleed caused by gastric ulcer at [facility].  Resolved after IV PPI.  Type screen and monitor.   Hypokalemia -to be managed with TPN  Thrombocytopenia -Isolated reading at 92, appears to be resolved, back to baseline, HIT is negative, will resume back on subcu heparin.          Condition - Extremely Guarded  Family Communication  : none  at bedside, discussed with patient, answered his questions.  Code Status :  Full  Consults  :  CCS, Oncology  Procedures"  S/p ex lap, GJ bypass J tube place, placement of surgical drain - Dr. Zenia Resides - 09/20/20  PUD Prophylaxis : PPI   Procedures  :      R. Arm PICC 09/13/20   ex lap, GJ bypass J tube place, placement of surgical drain  By  Dr. Zenia Resides on  09/20/20,      Disposition Plan  :    Status is: Inpatient  Remains inpatient appropriate because:IV treatments appropriate due to intensity of illness or inability to take PO   Dispo: The patient is from: Home              Anticipated d/c is to: Home              Patient currently is not medically stable to d/c.   Difficult to place patient No   DVT Prophylaxis  :  [place] heparin  SCDs Start: 09/12/20 2127    Lab Results  Component Value Date   PLT 214 09/24/2020    Diet :  Diet Order            Diet NPO time specified  Diet  effective now                  Inpatient Medications  Scheduled Meds:  . Chlorhexidine Gluconate Cloth  6 each Topical Daily  . cloNIDine  0.3 mg Transdermal Q Fri  . [START ON 09/25/2020] feeding supplement (PROSource TF)  45 mL Per Tube BID  . HYDROmorphone   Intravenous Q4H  . insulin aspart  0-15 Units Subcutaneous Q6H  . nitroGLYCERIN  1 inch Topical Q6H  . pantoprazole (PROTONIX) IV  40 mg Intravenous Q12H  . pneumococcal 23 valent vaccine  0.5 mL Intramuscular Tomorrow-1000   Continuous Infusions:  . feeding supplement (OSMOLITE 1.5 CAL) 1,000 mL (09/24/20 1113)  . TPN ADULT (ION) 80 mL/hr at 09/23/20 1728  . TPN ADULT (ION)     PRN Meds:.acetaminophen **OR** acetaminophen, artificial tears, diphenhydrAMINE, hydrALAZINE, labetalol, naloxone **AND** sodium chloride flush, [DISCONTINUED] ondansetron **OR** ondansetron (ZOFRAN) IV, sodium chloride flush  Antibiotics  :    Anti-infectives (From admission, onward)   Start     Dose/Rate Route Frequency Ordered Stop   09/20/20 1100  metroNIDAZOLE (FLAGYL) IVPB 500 mg        500 mg 100 mL/hr over 60 Minutes Intravenous To Short Stay 09/20/20 1049 09/20/20 1355   09/20/20 0600  ceFAZolin (ANCEF) IVPB 2g/100 mL premix        2 g 200 mL/hr over 30 Minutes Intravenous On call to O.R. 09/19/20 0940 09/20/20 [place] Edwen Mclester M.D on 09/24/2020 at 4:06 PM  To page go to [email or web address]  [facility] -  Office  [phone]    See all Orders from today for further details    Objective:   Vitals:   09/24/20 0806 09/24/20 0836 09/24/20 1130 09/24/20 1156  BP: (!) 145/77   (!) 173/96  Pulse: 81   82  Resp: 18 16 16 18   Temp: 97.9 F (36.6 C)   97.9 F (36.6 C)  TempSrc: Oral   Oral  SpO2: 96% 97% 98% 97%  Weight:      Height:        Wt Readings from Last 3 Encounters:  09/13/20 77.2 kg  08/20/20 81.4 kg     Intake/Output Summary (Last 24 hours) at 09/24/2020 1606 Last data filed at  09/24/2020 1510 Gross per 24 hour  Intake 2784.17 ml  Output 2795 ml  Net -10.83 ml     Physical Exam  Awake Alert, Oriented X 3, No new F.N deficits, Normal affect Symmetrical Chest wall movement, Good air movement bilaterally, CTAB RRR,No Gallops,Rubs or new Murmurs, No Parasternal Heave  midline surgical mesh with no significant oozing or discharge, left J-tube, right JP draiNo Cyanosis, Clubbing or edema, No new Rash or bruise         Data Review:    CBC Recent Labs  Lab 09/18/20 0207 09/19/20 0456 09/20/20 0259 09/21/20 1002 09/22/20 0458 09/23/20 0409 09/24/20 0406  WBC 13.3* 13.4* 15.5* 24.6* 21.8* 18.1* 14.2*  HGB 11.7* 11.2* 11.1* 10.9* 9.1* 9.3* 9.6*  HCT 35.4* 35.2* 33.8* 33.2* 25.8* 27.4* 29.6*  PLT 331 309 297 235 207 92* 214  MCV 91.0 95.1 95.5 94.9 98.5 97.2 96.1  MCH 30.1 30.3 31.4 31.1 34.7* 33.0 31.2  MCHC 33.1 31.8 32.8 32.8 35.3 33.9 32.4  RDW 14.4 14.4 14.2 14.2 14.6 14.7 15.1  LYMPHSABS 3.9 3.7 3.3  --   --  2.7  --   MONOABS 1.4* 1.5* 1.6*  --   --  1.7*  --   EOSABS 0.6* 0.6* 0.5  --   --  0.3  --   BASOSABS 0.1 0.1 0.1  --   --  0.0  --     Recent Labs  Lab 09/18/20 0207 09/19/20 0456 09/20/20 0259 09/21/20 1002 09/22/20 0458 09/23/20 0409 09/24/20 0406  NA 135 137 135 136 138 142 143  K 4.3 4.4 4.3 4.5 4.2 3.6 3.9  CL 100 101 101 103 104 107 108  CO2 27 30 28 26 30 30 27   GLUCOSE 141* 125* 115* 175* 171* 154* 143*  BUN 26* 27* 27* 26* 27* 24* 21  CREATININE 0.88 0.91 0.90 0.79 0.88 0.85 0.82  CALCIUM 9.1 9.1 9.1 8.9 8.8* 8.9 9.2  AST 33 39 40 36 27 24  --   ALT 44 54* 58* 55* 43 34  --   ALKPHOS 62 63 64 67 67 69  --   BILITOT 0.5 0.5 0.5 0.6 0.4 0.4  --   ALBUMIN 2.8* 2.8* 2.9* 2.7* 2.7* 2.5*  --   MG 2.1 2.1 2.0  --   --  2.1  --   BNP 17.9 15.0 16.7  --   --   --   --     ------------------------------------------------------------------------------------------------------------------ Recent Labs    09/23/20 0409   TRIG 105    Lab Results  Component Value Date   HGBA1C 5.6 09/13/2020   ------------------------------------------------------------------------------------------------------------------ No results for input(s): TSH, T4TOTAL, T3FREE, THYROIDAB in the last 72 hours.  Invalid input(s): FREET3  Cardiac Enzymes No results  for input(s): CKMB, TROPONINI, MYOGLOBIN in the last 168 hours.  Invalid input(s): CK ------------------------------------------------------------------------------------------------------------------    Component Value Date/Time   BNP 16.7 09/20/2020 0259    Micro Results No results found for this or any previous visit (from the past 240 hour(s)).  Radiology Reports DG Abd 1 View  Result Date: 09/17/2020 CLINICAL DATA:  Status post nasogastric tube placement EXAM: ABDOMEN - 1 VIEW COMPARISON:  September 12, 2020 FINDINGS: Nasogastric tube with tip and side port overlying the stomach. The bowel gas pattern is normal. No radio-opaque calculi or other significant radiographic abnormality are seen. IMPRESSION: Nasogastric tube with tip and side port overlying the stomach. Electronically Signed   By: Dahlia Bailiff MD   On: 09/17/2020 13:07   DG Chest Port 1 View  Result Date: 09/13/2020 CLINICAL DATA:  Cough and shortness of breath EXAM: PORTABLE CHEST 1 VIEW COMPARISON:  Chest CT September 05, 2020 FINDINGS: Nasogastric tube tip and side port in stomach. No edema or airspace opacity. Heart size and pulmonary vascularity are normal. No adenopathy. No bone lesions. IMPRESSION: Nasogastric tube tip and side port in stomach. Lungs clear. Cardiac silhouette within normal limits. Electronically Signed   By: Lowella Grip [name] M.D.   On: 09/13/2020 08:00   DG Abd Portable 1V  Result Date: 09/12/2020 CLINICAL DATA:  NG tube in place.  Gastric outlet obstruction. EXAM: PORTABLE ABDOMEN - 1 VIEW COMPARISON:  Abdominal CTA 09/05/2020 FINDINGS: Tip and side port of the enteric tube  are below the diaphragm in the stomach, enteric tube is coiled within the gastric body. Decreased gastric distension from prior CT. Generalized paucity of small bowel gas. Residual high-density contrast throughout the colon. Vascular calcifications are seen. IMPRESSION: Tip and side port of the enteric tube below the diaphragm in the stomach, enteric tube coiled within the gastric body. Electronically Signed   By: Keith Rake M.D.   On: 09/12/2020 23:28   [place] EKG SITE RITE  Result Date: 09/13/2020 If Site Rite image not attached, placement could not be confirmed due to current cardiac rhythm.

## 2020-09-24 NOTE — Progress Notes (Signed)
Tube feed started at 1113 at 10 ml/hr.

## 2020-09-24 NOTE — Progress Notes (Addendum)
Nutrition Follow-up  DOCUMENTATION CODES:   Obesity unspecified  INTERVENTION:   -TPN management per pharmacy -Initiate Osmolite 1.5 @ 10 ml/hr via j-tube and increase by 10 ml every 12 hours to goal rate of 55 ml/hr.   45 ml Prosource TF BID.    Tube feeding regimen provides 2060 kcal (100% of needs), 105 grams of protein, and 1006 ml of H2O.   NUTRITION DIAGNOSIS:   Increased nutrient needs related to chronic illness (gastric cancer) as evidenced by estimated needs.  Ongoing  GOAL:   Patient will meet greater than or equal to 90% of their needs  Met with TPN  MONITOR:   Diet advancement,Labs,Weight trends,Skin,I & O's  REASON FOR ASSESSMENT:   Consult Enteral/tube feeding initiation and management  ASSESSMENT:   Alexander [name] is a 65 y.o. male with medical history significant for COPD, HTN, HLD, GERD who was admitted from [facility] for management of newly diagnosed gastric malignancy with gastric outlet obstruction, he had work-up at [facility] which included EGD and biopsy showing gastric adenocarcinoma, NG tube was placed and he was transferred to [facility] for further treatment.  4/22- NGT placed, connected to low, intermittent suction 4/29- PICC placed, TPN initiated 5/6- s/p Procedure(s): FEEDING JEJUNOSTOMY (N/A) GASTROJEJUNOSTOMY; NGT replaced- connected to low, intermittent suction  Reviewed I/O's: -95.8 ml x 24 hours and -223 ml since admission  UOP: 2.1 L x 24 hours  NGT output: 850 ml x 24 hours  Drain output: 20 ml x 24 hours  Per general surgery notes, OR findings reveal "Unresectable locally advanced tumor in the gastric antrum, invading the head of the pancreas. No evidence of metastatic disease within the abdomen".   Case discussed with pharmacist; plan to start TF and continue TPN today. Pt continues with NGT to low, intermittent suction.  Pt continues to receive TPN at 80 ml/hr. Regimen provides 1962 kcals and 102  grams protein, meeting 100% of estimated kcal and protein needs.  Labs reviewed: CBGS: 134-148 (inpatient orders for glycemic control are 0-15 units insulin aspart every 6 hours).   Diet Order:   Diet Order            Diet NPO time specified  Diet effective now                 EDUCATION NEEDS:   Education needs have been addressed  Skin:  Skin Assessment: Skin Integrity Issues: Skin Integrity Issues:: Incisions Incisions: closed abdomen  Last BM:  09/15/20  Height:   Ht Readings from Last 1 Encounters:  09/13/20 _0  (1.575 m)    Weight:   Wt Readings from Last 1 Encounters:  09/13/20 77.2 kg    Ideal Body Weight:  53.6 kg  BMI:  Body mass index is 31.13 kg/m.  Estimated Nutritional Needs:   Kcal:  1900-2100  Protein:  100-120 gm  Fluid:  > 1.9 L    Loistine Chance, RD, LDN, [place] Registered Dietitian II Certified Diabetes Care and Education Specialist Please refer to [facility] for RD and/or RD on-call/weekend/after hours pager

## 2020-09-24 NOTE — Progress Notes (Signed)
4 Days Post-Op  Subjective: CC: Patient reports he is doing well. Some soreness over the incision that is well controlled with pca. No nausea. NGT on LIWS. Denies flatus or bm. Mobilizing. Voiding.   Objective: Vital signs in last 24 hours: Temp:  [97.3 F (36.3 C)-98.4 F (36.9 C)] 97.9 F (36.6 C) (05/10 0806) Pulse Rate:  [76-84] 81 (05/10 0806) Resp:  [17-18] 18 (05/10 0806) BP: (140-175)/(77-97) 145/77 (05/10 0806) SpO2:  [95 %-96 %] 96 % (05/10 0806) FiO2 (%):  [0 %] 0 % (05/10 0421) Last BM Date: 09/15/20  Intake/Output from previous day: 05/09 0701 - 05/10 0700 In: 2824.2 [I.V.:2734.2; NG/GT:60] Out: 2920 [Urine:2050; Emesis/NG output:850; Drains:20] Intake/Output this shift: No intake/output data recorded.  PE: Gen: Alert, NAD, pleasant Card: RRR Pulm: CTAB, no W/R/R, effort normal. On o2 Abd: Soft,mild distension, more active bowel sounds,appropriately tender around incision. Midline wound with honeycomb dressing in place is clean dry intact. Drain serosanguineous. J-tube in place. NG tube in place on LIWS Ext: No LE edema Psych: A&Ox3  Skin: no rashes noted, warm and dry  Lab Results:  Recent Labs    09/23/20 0409 09/24/20 0406  WBC 18.1* 14.2*  HGB 9.3* 9.6*  HCT 27.4* 29.6*  PLT 92* 214   BMET Recent Labs    09/23/20 0409 09/24/20 0406  NA 142 143  K 3.6 3.9  CL 107 108  CO2 30 27  GLUCOSE 154* 143*  BUN 24* 21  CREATININE 0.85 0.82  CALCIUM 8.9 9.2   PT/INR No results for input(s): LABPROT, INR in the last 72 hours. CMP     Component Value Date/Time   NA 143 09/24/2020 0406   K 3.9 09/24/2020 0406   CL 108 09/24/2020 0406   CO2 27 09/24/2020 0406   GLUCOSE 143 (H) 09/24/2020 0406   BUN 21 09/24/2020 0406   CREATININE 0.82 09/24/2020 0406   CALCIUM 9.2 09/24/2020 0406   PROT 6.1 (L) 09/23/2020 0409   ALBUMIN 2.5 (L) 09/23/2020 0409   AST 24 09/23/2020 0409   ALT 34 09/23/2020 0409   ALKPHOS 69 09/23/2020 0409    BILITOT 0.4 09/23/2020 0409   GFRNONAA >60 09/24/2020 0406   Lipase  No results found for: LIPASE     Studies/Results: No results found.  Anti-infectives: Anti-infectives (From admission, onward)   Start     Dose/Rate Route Frequency Ordered Stop   09/20/20 1100  metroNIDAZOLE (FLAGYL) IVPB 500 mg        500 mg 100 mL/hr over 60 Minutes Intravenous To Short Stay 09/20/20 1049 09/20/20 1355   09/20/20 0600  ceFAZolin (ANCEF) IVPB 2g/100 mL premix        2 g 200 mL/hr over 30 Minutes Intravenous On call to O.R. 09/19/20 0940 09/20/20 1324       Assessment/Plan GOO from a gastric cancer at the antrum S/p ex lap,GJ bypass J tubeplace, placement of surgical drain - Dr. Zenia Resides - 09/20/20 - POD #4 -OR findings:Unresectable locally advanced tumor in the gastric antrum, invading the head of the pancreas. No evidence of metastatic disease within the abdomen.  - Continue PCAtill able to give meds through J tube - Cont TNA - Can start 10 ml/hr via j-yube and increase by 10 ml every 12 hours to goal.  - Keep NGT to LIWS till ROBF -Mobilize. PT rec no follow up - Onc following - Dr. Marin Olp - Cont surgical drain (was placed around pancreas) - Pulm toilet  FEN -TPN,  NGT, NPO, J-tube TF's, IVF per TRH VTE -SCDs, hep subq ID -Ance/flagylf periop Foley -out, voiding   LOS: 12 days    Jillyn Ledger , [facility] 09/24/2020, 8:14 AM Please see Amion for pager number during day hours 7:00am-4:30pm

## 2020-09-25 LAB — GLUCOSE, CAPILLARY
Glucose-Capillary: 147 mg/dL — ABNORMAL HIGH (ref 70–99)
Glucose-Capillary: 132 mg/dL — ABNORMAL HIGH (ref 70–99)
Glucose-Capillary: 142 mg/dL — ABNORMAL HIGH (ref 70–99)
Glucose-Capillary: 122 mg/dL — ABNORMAL HIGH (ref 70–99)
Glucose-Capillary: 121 mg/dL — ABNORMAL HIGH (ref 70–99)

## 2020-09-25 LAB — AMYLASE, BODY FLUID (OTHER): Amylase, Body Fluid: 7 U/L

## 2020-09-25 MED ORDER — POTASSIUM CHLORIDE 20 MEQ PO PACK
20.0000 meq | PACK | Freq: Once | ORAL | Status: AC
  Administered 2020-09-25: 20 meq
  Filled 2020-09-25: qty 1

## 2020-09-25 MED ORDER — METHOCARBAMOL 1000 MG/10ML IJ SOLN
500.0000 mg | Freq: Four times a day (QID) | INTRAVENOUS | Status: DC | PRN
  Administered 2020-09-25 – 2020-09-27 (×3): 500 mg via INTRAVENOUS
  Filled 2020-09-25: qty 500
  Filled 2020-09-25 (×2): qty 5

## 2020-09-25 MED ORDER — TRAVASOL 10 % IV SOLN: INTRAVENOUS | Status: DC

## 2020-09-25 MED ORDER — FAMOTIDINE IN NACL 20-0.9 MG/50ML-% IV SOLN
20.0000 mg | Freq: Once | INTRAVENOUS | Status: AC
  Administered 2020-09-25: 20 mg via INTRAVENOUS
  Filled 2020-09-25: qty 50

## 2020-09-25 MED ORDER — TRAVASOL 10 % IV SOLN
INTRAVENOUS | Status: AC
  Filled 2020-09-25: qty 507.6

## 2020-09-25 NOTE — Progress Notes (Signed)
PHARMACY - TOTAL PARENTERAL NUTRITION CONSULT NOTE  Indication: Gastric outlet obstruction  Patient Measurements: Height: 5\' 2"  (157.5 cm) Weight: 77.2 kg (170 lb 3.1 oz) IBW/kg (Calculated) : 54.6 TPN AdjBW (KG): 60.2 Body mass index is 31.13 kg/m. Usual Weight: ~75kg per patient   Assessment:  65 yo M with PMH significant for GERD and now newly diagnosed gastric malignancy with planned bowel resection. Patient reports a normal diet (3 meals/day) PTA with no dietary restrictions and no unusual weight changes recently. He stopped eating regularly a few days PTA.  Pharmacy consulted to manage TPN.  Glucose / Insulin: no hx DM. SSI d/c'd 5/3 with patient not requiring insulin and CBGs controlled. SSI restarted 5/8 post-op >> used 10 units yesterday.  CBGs < 180 Electrolytes: all WNL on 5/10 (CoCa high normal) Renal: SCr < 1 stable, BUN WNL Hepatic: LFTs / Tbili / TG WNL. Albumin 2.5.  Prealbumin down to 9.4, likely d/t stress. Intake / Output; MIVF: UOP 1.1 ml/kg/hr, NG 818mL, drain 58mL GI Imaging:  4/28 Abx Xray: enteric tube placed in stomach GI Surgeries / Procedures:  5/6 ex-lap, GJ bypass, J tube placed, placement of surgical drain  Central access: PICC 09/13/20 TPN start date: 09/13/20  Nutritional Goals: (per RD rec 5/10) 1900-2100 kCal, 100-120g AA, > 1.9L fluid per day Goal TPN 80 ml/hr (53g/L AA, 15% CHO and 30g/L ILE) = 1962 kCal, 102g AA per day  Current Nutrition:  TPN Prosource 16mL BID = 80 kCal and 22g AA per day Osmolite 1.5 at 20 ml/hr, to advance to 30 ml/hr this AM (goal rate 55 ml/hr) *at 30 ml/hr = 45g AA and 1080 kCal *patient reports tolerating TF well, no N/V/abd pain nor distension  Plan:  Reduce TPN to 45 ml/hr at 1800 (goal rate 80 ml/hr), providing 51g AA and 972 kCal TPN + Prostat + TF at 30 ml/hr will provide 118g AA and 2132 kCal, meeting ~100% of needs Electrolytes in TPN: Na 119mEq/L, K 43mEq/L, Ca 21mEq/L, Mg 77mEq/L, Phos 18mmol/L, Cl:Ac  1:1 Add standard MVI and trace elements to TPN Continue moderate SSI Q6H - consider stopping when off TPN KCL 36mEq PT x 1 given less K in TPN today d/t weaning Standard TPN labs on Mon/Thurs.  F/U TF advancement to wean off TPN in AM.  Jahsir Rama D. Mina Marble, PharmD, BCPS, [place] 09/25/2020, 7:55 AM

## 2020-09-25 NOTE — Progress Notes (Signed)
Physical Therapy Treatment Patient Details Name: Alexander [name] MRN: [id number] DOB: 04-26-1956 Today's Date: 09/25/2020    History of Present Illness 65yo male admitted 09/12/20 via transfer from [facility] secondary to gastic malignancy with gastric outlet obstruction. Received EGD and NGT 09/06/20. Pt underwent jejunostomy and gastrojejunostomy on 5/6 with J tube placement, unable to resect tumor. PMH COPD.    PT Comments    Patient received in recliner, very pleasant and motivated to participate with PT. Still able to mobilize on a modified independent to independent basis with IV pole, however do note some reduced endurance and lower tolerance to mobility as compared to pre-surgery evaluation. Became short of breath with gait but VSS on RA. Left up in recliner with all needs met, spouse present- increased PT frequency to facilitate increased mobility/functional activity tolerance and to assist in effective recovery from extensive abdominal surgery.    Follow Up Recommendations  No PT follow up     Equipment Recommendations  None recommended by PT    Recommendations for Other Services       Precautions / Restrictions Precautions Precautions: Fall;Other (comment) Precaution Comments: J tube, JP drain, NG tube, pain pump, CO2 monitoring, abdominal incision Restrictions Weight Bearing Restrictions: No    Mobility  Bed Mobility               General bed mobility comments: Pt in recliner upon arrival.    Transfers Overall transfer level: Independent Equipment used: None             General transfer comment: no physical assist given, did assist in line management  Ambulation/Gait Ambulation/Gait assistance: Modified independent (Device/Increase time) Gait Distance (Feet): 180 Feet Assistive device: IV Pole Gait Pattern/deviations: Step-through pattern;Decreased stride length;Trunk flexed;Antalgic Gait velocity: decreased   General Gait Details:  gait a bit more antalgic than his usual and slower as well- did become SOB with activity on room air but with return to room SPO2 100% on monitor   Stairs             Wheelchair Mobility    Modified Rankin (Stroke Patients Only)       Balance Overall balance assessment: Independent                                          Cognition Arousal/Alertness: Awake/alert Behavior During Therapy: WFL for tasks assessed/performed Overall Cognitive Status: Within Functional Limits for tasks assessed                                        Exercises      General Comments        Pertinent Vitals/Pain Pain Assessment: Faces Faces Pain Scale: Hurts a little bit Pain Location: abdominal area Pain Descriptors / Indicators: Operative site guarding;Sore Pain Intervention(s): Limited activity within patient's tolerance;Monitored during session    Home Living                      Prior Function            PT Goals (current goals can now be found in the care plan section) Acute Rehab PT Goals Patient Stated Goal: Go home PT Goal Formulation: With patient Time For Goal Achievement: 10/01/20 Potential to Achieve Goals: Good Progress towards  PT goals: Progressing toward goals    Frequency    Min 3X/week      PT Plan Frequency needs to be updated    Co-evaluation              AM-PAC PT "6 Clicks" Mobility   Outcome Measure  Help needed turning from your back to your side while in a flat bed without using bedrails?: None Help needed moving from lying on your back to sitting on the side of a flat bed without using bedrails?: None Help needed moving to and from a bed to a chair (including a wheelchair)?: None Help needed standing up from a chair using your arms (e.g., wheelchair or bedside chair)?: None Help needed to walk in hospital room?: None Help needed climbing 3-5 steps with a railing? : A Little 6 Click Score:  23    End of Session   Activity Tolerance: Patient tolerated treatment well Patient left: in chair;with call bell/phone within reach;with family/visitor present Nurse Communication: Mobility status PT Visit Diagnosis: Other abnormalities of gait and mobility (R26.89);Pain Pain - Right/Left:  (abdomen)     Time: 8063-8685 PT Time Calculation (min) (ACUTE ONLY): 27 min  Charges:  $Gait Training: 8-22 mins $Therapeutic Activity: 8-22 mins                     Windell Norfolk, DPT, PN1   Supplemental Physical Therapist [place]    Pager [phone] Acute Rehab Office [phone]

## 2020-09-25 NOTE — Progress Notes (Signed)
[facility] Progress Note  5 Days Post-Op  Subjective: CC-  Comfortable this morning. Abdominal pain well controlled with PCA. Denies n/v. No flatus or BM. NG tube with 600cc bilious output last 24 hours. OOB multiple times yesterday. J TF at 20cc/hr this AM.  Objective: Vital signs in last 24 hours: Temp:  [97.6 F (36.4 C)-98.4 F (36.9 C)] 98 F (36.7 C) (05/11 0615) Pulse Rate:  [79-92] 86 (05/11 0615) Resp:  [16-20] 17 (05/11 0843) BP: (100-173)/(75-104) 146/96 (05/11 0615) SpO2:  [95 %-98 %] 96 % (05/11 0843) Last BM Date: 09/15/20  Intake/Output from previous day: 05/10 0701 - 05/11 0700 In: 1221 [I.V.:945.7; NG/GT:255.3] Out: 2550 [Urine:1900; Emesis/NG output:600; Drains:50] Intake/Output this shift: No intake/output data recorded.  PE: Gen: Alert, NAD, pleasant Pulm: rate and effort normal Abd: Soft,mild distension, hypoactive BS,appropriately tender around incision. Midline incision with staples present and no erythema or drainage. Drain serosanguinous fluid in bulb. J-tube in place with TF running at 20cc/hr. NG tube in place on LIWS  Lab Results:  Recent Labs    09/23/20 0409 09/24/20 0406  WBC 18.1* 14.2*  HGB 9.3* 9.6*  HCT 27.4* 29.6*  PLT 92* 214   BMET Recent Labs    09/23/20 0409 09/24/20 0406  NA 142 143  K 3.6 3.9  CL 107 108  CO2 30 27  GLUCOSE 154* 143*  BUN 24* 21  CREATININE 0.85 0.82  CALCIUM 8.9 9.2   PT/INR No results for input(s): LABPROT, INR in the last 72 hours. CMP     Component Value Date/Time   NA 143 09/24/2020 0406   K 3.9 09/24/2020 0406   CL 108 09/24/2020 0406   CO2 27 09/24/2020 0406   GLUCOSE 143 (H) 09/24/2020 0406   BUN 21 09/24/2020 0406   CREATININE 0.82 09/24/2020 0406   CALCIUM 9.2 09/24/2020 0406   PROT 6.1 (L) 09/23/2020 0409   ALBUMIN 2.5 (L) 09/23/2020 0409   AST 24 09/23/2020 0409   ALT 34 09/23/2020 0409   ALKPHOS 69 09/23/2020 0409   BILITOT 0.4 09/23/2020 0409    GFRNONAA >60 09/24/2020 0406   Lipase  No results found for: LIPASE     Studies/Results: No results found.  Anti-infectives: Anti-infectives (From admission, onward)   Start     Dose/Rate Route Frequency Ordered Stop   09/20/20 1100  metroNIDAZOLE (FLAGYL) IVPB 500 mg        500 mg 100 mL/hr over 60 Minutes Intravenous To Short Stay 09/20/20 1049 09/20/20 1355   09/20/20 0600  ceFAZolin (ANCEF) IVPB 2g/100 mL premix        2 g 200 mL/hr over 30 Minutes Intravenous On call to O.R. 09/19/20 0940 09/20/20 1324       Assessment/Plan GOO from a gastric cancer at the antrum S/p ex lap,GJ bypass J tubeplace, placement of surgical drain - Dr. Zenia Resides - 09/20/20 - POD #5 -OR findings:Unresectable locally advanced tumor in the gastric antrum, invading the head of the pancreas. No evidence of metastatic disease within the abdomen.  - Continue PCAtill able to give meds through J tube -ContTNA until tolerating TF at goal - Tube feedings currently at 20cc/hr via J-tube, continue increasing by 10 ml every 12hours to goal  - Keep NGT to LIWS till ROBF -Mobilize. PTrec no follow up - Onc following - Dr. Marin Olp - Cont surgical drain(was placed around pancreas) - Pulm toilet  FEN -TPN, NGT, NPO, J-tube TF's, IVF per TRH VTE -SCDs, hep subq ID -  Ance/flagylf periop Foley -out, voiding   LOS: 13 days    Wellington Hampshire, [facility] 09/25/2020, 9:04 AM Please see Amion for pager number during day hours 7:00am-4:30pm

## 2020-09-25 NOTE — Progress Notes (Signed)
PROGRESS NOTE        PATIENT DETAILS Name: Alexander [name] Age: 65 y.o. Sex: male Date of Birth: 09-26-1955 Admit Date: 09/12/2020 Admitting Physician Lenore Cordia, MD URK:[id number], Anderson Malta, MD  Brief Narrative: Patient is a 65 y.o. male with history of COPD, HTN, HLD, GERD-who was found to have gastric outlet obstruction due to a newly diagnosed gastric malignancy-he was transferred to [facility] for further evaluation and treatment.  He underwent exploratory laparotomy-GJ bypass and G-tube placement-he unfortunately was found to have a unresectable tumor.  Significant events: 4/28>> transfer from [place] to [facility] for evaluation of gastric outlet obstruction due to malignancy.  Significant studies: None  Antimicrobial therapy: None  Microbiology data: None  Procedures : 5/6>> exploratory laparotomy-gastrojejunostomy-feeding jejunostomy.  Consults: General surgery, oncology  DVT Prophylaxis : heparin injection 5,000 Units Start: 09/24/20 2200 SCDs Start: 09/12/20 2127   Subjective: Some heartburn but otherwise appears comfortable.   Assessment/Plan: Gastric outlet obstruction due to gastric adenocarcinoma: He was found to have a unresectable gastric tumor-he subsequently underwent gastrojejunostomy and J-tube placement.  Remains with NG tube/TNA-G-tube feedings been gradually increased.  Oncology following with plans for outpatient oncology follow-up and aspirin.  HTN: BP stable-on Catapres and Nitropaste.  Upper GI bleeding: This occurred at [facility]-this has resolved-on PPI.  Thrombocytopenia: Resolved.  HIT antibody was negative.  Obesity: Estimated body mass index is 31.13 kg/m as calculated from the following:   Height as of this encounter: 5\' 2"  (1.575 m).   Weight as of this encounter: 77.2 kg.    Diet: Diet Order            Diet NPO time specified Except for: Ice Chips  Diet effective now                   Code Status: Full code  Family Communication: Spouse at bedside   Disposition Plan: Status is: Inpatient  Remains inpatient appropriate because:Inpatient level of care appropriate due to severity of illness   Dispo: The patient is from: Home              Anticipated d/c is to: Home              Patient currently is not medically stable to d/c.   Difficult to place patient No   Barriers to Discharge: NG tube/TNA remains in place-J-tube feedings being gradually increased.  Antimicrobial agents: Anti-infectives (From admission, onward)   Start     Dose/Rate Route Frequency Ordered Stop   09/20/20 1100  metroNIDAZOLE (FLAGYL) IVPB 500 mg        500 mg 100 mL/hr over 60 Minutes Intravenous To Short Stay 09/20/20 1049 09/20/20 1355   09/20/20 0600  ceFAZolin (ANCEF) IVPB 2g/100 mL premix        2 g 200 mL/hr over 30 Minutes Intravenous On call to O.R. 09/19/20 0940 09/20/20 1324       Time spent: 25 minutes-Greater than 50% of this time was spent in counseling, explanation of diagnosis, planning of further management, and coordination of care.  MEDICATIONS: Scheduled Meds: . Chlorhexidine Gluconate Cloth  6 each Topical Daily  . cloNIDine  0.3 mg Transdermal Q Fri  . feeding supplement (PROSource TF)  45 mL Per Tube BID  . heparin injection (subcutaneous)  5,000 Units Subcutaneous Q8H  . HYDROmorphone  Intravenous Q4H  . insulin aspart  0-15 Units Subcutaneous Q6H  . nitroGLYCERIN  1 inch Topical Q6H  . pantoprazole (PROTONIX) IV  40 mg Intravenous Q12H  . pneumococcal 23 valent vaccine  0.5 mL Intramuscular Tomorrow-1000   Continuous Infusions: . feeding supplement (OSMOLITE 1.5 CAL) 30 mL/hr at 09/25/20 1123  . methocarbamol (ROBAXIN) IV 500 mg (09/25/20 1429)  . TPN ADULT (ION) 80 mL/hr at 09/24/20 1716  . TPN ADULT (ION)     PRN Meds:.acetaminophen **OR** acetaminophen, artificial tears, diphenhydrAMINE, hydrALAZINE, labetalol, methocarbamol  (ROBAXIN) IV, naloxone **AND** sodium chloride flush, [DISCONTINUED] ondansetron **OR** ondansetron (ZOFRAN) IV, sodium chloride flush   PHYSICAL EXAM: Vital signs: Vitals:   09/25/20 0843 09/25/20 1025 09/25/20 1138 09/25/20 1346  BP:  (!) 165/90 101/84   Pulse:   94   Resp: 17  19 15   Temp:      TempSrc:      SpO2: 96%  95% 97%  Weight:      Height:       Filed Weights   09/13/20 0811 09/13/20 0900  Weight: 77.2 kg 77.2 kg   Body mass index is 31.13 kg/m.   Gen Exam:Alert awake-not in any distress HEENT:atraumatic, normocephalic Chest: B/L clear to auscultation anteriorly CVS:S1S2 regular Abdomen:soft non tender, non distended Extremities:no edema Neurology: Non focal Skin: no rash  I have personally reviewed following labs and imaging studies  LABORATORY DATA: CBC: Recent Labs  Lab 09/19/20 0456 09/20/20 0259 09/21/20 1002 09/22/20 0458 09/23/20 0409 09/24/20 0406  WBC 13.4* 15.5* 24.6* 21.8* 18.1* 14.2*  NEUTROABS 7.4 9.9*  --   --  13.1*  --   HGB 11.2* 11.1* 10.9* 9.1* 9.3* 9.6*  HCT 35.2* 33.8* 33.2* 25.8* 27.4* 29.6*  MCV 95.1 95.5 94.9 98.5 97.2 96.1  PLT 309 297 235 207 92* 322    Basic Metabolic Panel: Recent Labs  Lab 09/19/20 0456 09/20/20 0259 09/21/20 1002 09/22/20 0458 09/23/20 0409 09/24/20 0406  NA 137 135 136 138 142 143  K 4.4 4.3 4.5 4.2 3.6 3.9  CL 101 101 103 104 107 108  CO2 30 28 26 30 30 27   GLUCOSE 125* 115* 175* 171* 154* 143*  BUN 27* 27* 26* 27* 24* 21  CREATININE 0.91 0.90 0.79 0.88 0.85 0.82  CALCIUM 9.1 9.1 8.9 8.8* 8.9 9.2  MG 2.1 2.0  --   --  2.1  --   PHOS 4.3  --   --   --  2.9  --     GFR: Estimated Creatinine Clearance: 79.7 mL/min (by C-G formula based on SCr of 0.82 mg/dL).  Liver Function Tests: Recent Labs  Lab 09/19/20 0456 09/20/20 0259 09/21/20 1002 09/22/20 0458 09/23/20 0409  AST 39 40 36 27 24  ALT 54* 58* 55* 43 34  ALKPHOS 63 64 67 67 69  BILITOT 0.5 0.5 0.6 0.4 0.4  PROT 6.1*  6.1* 5.9* 5.9* 6.1*  ALBUMIN 2.8* 2.9* 2.7* 2.7* 2.5*   No results for input(s): LIPASE, AMYLASE in the last 168 hours. No results for input(s): AMMONIA in the last 168 hours.  Coagulation Profile: No results for input(s): INR, PROTIME in the last 168 hours.  Cardiac Enzymes: No results for input(s): CKTOTAL, CKMB, CKMBINDEX, TROPONINI in the last 168 hours.  BNP (last 3 results) No results for input(s): PROBNP in the last 8760 hours.  Lipid Profile: Recent Labs    09/23/20 0409  TRIG 105    Thyroid Function Tests: No results for  input(s): TSH, T4TOTAL, FREET4, T3FREE, THYROIDAB in the last 72 hours.  Anemia Panel: No results for input(s): VITAMINB12, FOLATE, FERRITIN, TIBC, IRON, RETICCTPCT in the last 72 hours.  Urine analysis: No results found for: COLORURINE, APPEARANCEUR, LABSPEC, PHURINE, GLUCOSEU, HGBUR, BILIRUBINUR, KETONESUR, PROTEINUR, UROBILINOGEN, NITRITE, LEUKOCYTESUR  Sepsis Labs: Lactic Acid, Venous No results found for: LATICACIDVEN  MICROBIOLOGY: No results found for this or any previous visit (from the past 240 hour(s)).  RADIOLOGY STUDIES/RESULTS: No results found.   LOS: 13 days   Oren Binet, MD  [facility]    To contact the attending provider between 7A-7P or the covering provider during after hours 7P-7A, please log into the web site [email or web address] and access using universal [place] password for that web site. If you do not have the password, please call the hospital operator.  09/25/2020, 3:50 PM

## 2020-09-26 LAB — COMPREHENSIVE METABOLIC PANEL
ALT: 48 U/L — ABNORMAL HIGH (ref 0–44)
AST: 31 U/L (ref 15–41)
Albumin: 2.4 g/dL — ABNORMAL LOW (ref 3.5–5.0)
Alkaline Phosphatase: 95 U/L (ref 38–126)
Anion gap: 9 (ref 5–15)
BUN: 27 mg/dL — ABNORMAL HIGH (ref 8–23)
CO2: 27 mmol/L (ref 22–32)
Calcium: 8.9 mg/dL (ref 8.9–10.3)
Chloride: 102 mmol/L (ref 98–111)
Creatinine, Ser: 0.94 mg/dL (ref 0.61–1.24)
GFR, Estimated: >60 mL/min (ref >60)
Glucose, Bld: 148 mg/dL — ABNORMAL HIGH (ref 70–99)
Potassium: 4.2 mmol/L (ref 3.5–5.1)
Sodium: 138 mmol/L (ref 135–145)
Total Bilirubin: 0.7 mg/dL (ref 0.3–1.2)
Total Protein: 5.8 g/dL — ABNORMAL LOW (ref 6.5–8.1)

## 2020-09-26 LAB — GLUCOSE, CAPILLARY
Glucose-Capillary: 125 mg/dL — ABNORMAL HIGH (ref 70–99)
Glucose-Capillary: 128 mg/dL — ABNORMAL HIGH (ref 70–99)
Glucose-Capillary: 131 mg/dL — ABNORMAL HIGH (ref 70–99)
Glucose-Capillary: 132 mg/dL — ABNORMAL HIGH (ref 70–99)
Glucose-Capillary: 133 mg/dL — ABNORMAL HIGH (ref 70–99)
Glucose-Capillary: 134 mg/dL — ABNORMAL HIGH (ref 70–99)
Glucose-Capillary: 142 mg/dL — ABNORMAL HIGH (ref 70–99)
Glucose-Capillary: 145 mg/dL — ABNORMAL HIGH (ref 70–99)

## 2020-09-26 LAB — MAGNESIUM: Magnesium: 2.1 mg/dL (ref 1.7–2.4)

## 2020-09-26 LAB — PHOSPHORUS: Phosphorus: 4.9 mg/dL — ABNORMAL HIGH (ref 2.5–4.6)

## 2020-09-26 MED ORDER — PANTOPRAZOLE SODIUM 40 MG PO PACK
40.0000 mg | PACK | Freq: Two times a day (BID) | ORAL | Status: DC
  Administered 2020-09-26 – 2020-10-02 (×12): 40 mg
  Filled 2020-09-26 (×12): qty 20

## 2020-09-26 MED ORDER — FREE WATER
150.0000 mL | Status: DC
  Administered 2020-09-26 – 2020-10-02 (×29): 150 mL

## 2020-09-26 MED ORDER — ADULT MULTIVITAMIN W/MINERALS CH
1.0000 | ORAL_TABLET | Freq: Every day | ORAL | Status: DC
  Administered 2020-09-26 – 2020-10-01 (×6): 1
  Filled 2020-09-26 (×6): qty 1

## 2020-09-26 NOTE — Progress Notes (Addendum)
PHARMACY - TOTAL PARENTERAL NUTRITION CONSULT NOTE  Indication: Gastric outlet obstruction  Patient Measurements: Height: _0  (157.5 cm) Weight: 77.2 kg (170 lb 3.1 oz) IBW/kg (Calculated) : 54.6 TPN AdjBW (KG): 60.2 Body mass index is 31.13 kg/m. Usual Weight: ~75kg per patient   Assessment:  65 yo M with PMH significant for GERD and now newly diagnosed gastric malignancy with planned bowel resection. Patient reports a normal diet (3 meals/day) PTA with no dietary restrictions and no unusual weight changes recently. He stopped eating regularly a few days PTA.  Pharmacy consulted to manage TPN.  Glucose / Insulin: no hx DM. SSI d/c'd 5/3 with patient not requiring insulin and CBGs controlled. SSI restarted 5/8 post-op >> used 6 units yesterday.  CBGs < 180 Electrolytes: all WNL except slightly high Phos (Ca x Phos < 55) Renal: SCr < 1 stable, BUN WNL Hepatic: ALT mildly elevated, AST / alk phos / tbili / TG WNL. Albumin 2.4.  Prealbumin down to 9.4, likely d/t stress. Intake / Output; MIVF: UOP 0.8 ml/kg/hr, NG 1353m, drain 842m LBM 5/11 GI Imaging:  4/28 Abx Xray: enteric tube placed in stomach GI Surgeries / Procedures:  5/6 ex-lap, GJ bypass, J tube placed, placement of surgical drain  Central access: PICC 09/13/20 TPN start date: 09/13/20  Nutritional Goals: (per RD rec 5/10) 1900-2100 kCal, 100-120g AA, > 1.9L fluid per day Goal TPN 80 ml/hr (53g/L AA, 15% CHO and 30g/L ILE) = 1962 kCal, 102g AA per day  Current Nutrition:  TPN Prosource [id number] = 80 kCal and 22g AA per day Osmolite 1.5 at 40 ml/hr, increase to 50 ml/hr this afternoon (goal rate 55 ml/hr) *patient reports tolerating TF well, no N/V/abd pain nor distension  Plan:  TF currently meets > 60% of patient's needs.  Discussed with Surgery, stop TPN post current bag. Reduce TPN to 25 ml/hr, then stop at 1800 D/C SSI/CBG checks, TPN labs and nursing care orders Change PPI to PT Add daily multivitamin  Alexander [name]  D. DanMina MarbleharmD, BCPS, [place]CC[place]12/2022, 10:45 AM

## 2020-09-26 NOTE — Progress Notes (Signed)
Nutrition Follow-up  DOCUMENTATION CODES:   Obesity unspecified  INTERVENTION:   -Continue Osmolite 1.5@ 68ml/hr via j-tubeand increase by 10 ml every 12hours to goal rate of 64ml/hr.   49ml Prosource TF BID.   150 ml free water flush every 4 hours  Tube feeding regimen provides2060kcal (100% of needs),105grams of protein, and 1080ml of H2O. Total free water: 1906 ml daily.  NUTRITION DIAGNOSIS:   Increased nutrient needs related to chronic illness (gastric cancer) as evidenced by estimated needs.  Ongoing  GOAL:   Patient will meet greater than or equal to 90% of their needs  Progressing   MONITOR:   Diet advancement,Labs,Weight trends,Skin,I & O's  REASON FOR ASSESSMENT:   Consult Enteral/tube feeding initiation and management  ASSESSMENT:   Alexander [name] is a 65 y.o. male with medical history significant for COPD, HTN, HLD, GERD who was admitted from [facility] for management of newly diagnosed gastric malignancy with gastric outlet obstruction, he had work-up at [facility] which included EGD and biopsy showing gastric adenocarcinoma, NG tube was placed and he was transferred to [facility] for further treatment.  4/22- NGT placed, connected to low, intermittent suction 4/29- PICC placed, TPN initiated 5/6- s/pProcedure(s): FEEDING JEJUNOSTOMY (N/A) GASTROJEJUNOSTOMY; NGT replaced- connected to low, intermittent suction 5/10- TF initiated 5/12- TPN d/c  Reviewed I/O's: -1.2 L x 24 hours and -2.8 L since admission  UOP: 1.4 L x 24 hours  NGT output: 1.3 L x 24 hours  Drain output: 80 ml x 24 hours  Per general surgery notes, OR findings reveal "Unresectable locally advanced tumor in the gastric antrum, invading the head of the pancreas. No evidence of metastatic disease within the abdomen".   Plan for NGT clamping trials today.   Per pharmacy note, TPN d/c today.   Pt remains on TF via j-tube and tolerating well.  Osmolite 1.2 currently infusing via j-tube @ 40 ml/hr. Also receiving 45 ml Prosource TF BID. Complete regimen currently providing 1520 kcals, 82 grams protein, and 732 ml fluid, meeting 80% of estimated kcal needs and 82% of estimated protein needs.   Labs reviewed: CBGS: 131-142 (inpatient orders for glycemic control are none).   Diet Order:   Diet Order            Diet NPO time specified Except for: Ice Chips  Diet effective now                 EDUCATION NEEDS:   Education needs have been addressed  Skin:  Skin Assessment: Skin Integrity Issues: Skin Integrity Issues:: Incisions Incisions: closed abdomen  Last BM:  09/26/20  Height:   Ht Readings from Last 1 Encounters:  09/13/20 5\' 2"  (1.575 m)    Weight:   Wt Readings from Last 1 Encounters:  09/13/20 77.2 kg    Ideal Body Weight:  53.6 kg  BMI:  Body mass index is 31.13 kg/m.  Estimated Nutritional Needs:   Kcal:  1900-2100  Protein:  100-120 gm  Fluid:  > 1.9 L    Loistine Chance, RD, LDN, [place] Registered Dietitian II Certified Diabetes Care and Education Specialist Please refer to [facility] [place] for RD and/or RD on-call/weekend/after hours pager

## 2020-09-26 NOTE — Progress Notes (Signed)
PROGRESS NOTE        PATIENT DETAILS Name: Alexander [name] Age: 65 y.o. Sex: male Date of Birth: 1955/09/26 Admit Date: 09/12/2020 Admitting Physician Lenore Cordia, MD [id number], Anderson Malta, MD  Brief Narrative: Patient is a 65 y.o. male with history of COPD, HTN, HLD, GERD-who was found to have gastric outlet obstruction due to a newly diagnosed gastric malignancy-he was transferred to [facility] for further evaluation and treatment.  He underwent exploratory laparotomy-GJ bypass and G-tube placement-he unfortunately was found to have a unresectable tumor.  Significant events: 4/28>> transfer from [place] to [facility] for evaluation of gastric outlet obstruction due to malignancy.  Significant studies: None  Antimicrobial therapy: None  Microbiology data: None  Procedures : 5/6>> exploratory laparotomy-gastrojejunostomy-feeding jejunostomy.  Consults: General surgery, oncology  DVT Prophylaxis : heparin injection 5,000 Units Start: 09/24/20 2200 SCDs Start: 09/12/20 2127   Subjective: Heartburn much better today.  No BM but passing flatus.   Assessment/Plan: Gastric outlet obstruction due to gastric adenocarcinoma: He was found to have a unresectable gastric tumor-he subsequently underwent gastrojejunostomy and J-tube placement.  Remains with NG tube/TNA-G-tube feedings been gradually increased.  General surgery following and managing postop care.  Oncology following with plans for outpatient oncology follow-up.    HTN: Stable-continue clonidine patch and Nitropaste.    Upper GI bleeding: This occurred at [facility]-this has resolved-on PPI.  Normocytic anemia: Likely due to acute illness.  Follow CBC periodically.  Thrombocytopenia: Resolved.  HIT antibody was negative.  Obesity: Estimated body mass index is 31.13 kg/m as calculated from the following:   Height as of this encounter: 5\' 2"  (1.575 m).   Weight as  of this encounter: 77.2 kg.    Diet: Diet Order            Diet NPO time specified Except for: Ice Chips  Diet effective now                  Code Status: Full code  Family Communication: Spouse at bedside on 5/11   Disposition Plan: Status is: Inpatient  Remains inpatient appropriate because:Inpatient level of care appropriate due to severity of illness   Dispo: The patient is from: Home              Anticipated d/c is to: Home              Patient currently is not medically stable to d/c.   Difficult to place patient No   Barriers to Discharge: NG tube/TNA remains in place-J-tube feedings being gradually increased.  Antimicrobial agents: Anti-infectives (From admission, onward)   Start     Dose/Rate Route Frequency Ordered Stop   09/20/20 1100  metroNIDAZOLE (FLAGYL) IVPB 500 mg        500 mg 100 mL/hr over 60 Minutes Intravenous To Short Stay 09/20/20 1049 09/20/20 1355   09/20/20 0600  ceFAZolin (ANCEF) IVPB 2g/100 mL premix        2 g 200 mL/hr over 30 Minutes Intravenous On call to O.R. 09/19/20 0940 09/20/20 1324       Time spent: 25 minutes-Greater than 50% of this time was spent in counseling, explanation of diagnosis, planning of further management, and coordination of care.  MEDICATIONS: Scheduled Meds: . Chlorhexidine Gluconate Cloth  6 each Topical Daily  . cloNIDine  0.3 mg Transdermal  Q Fri  . feeding supplement (PROSource TF)  45 mL Per Tube BID  . heparin injection (subcutaneous)  5,000 Units Subcutaneous Q8H  . HYDROmorphone   Intravenous Q4H  . multivitamin with minerals  1 tablet Per Tube QHS  . nitroGLYCERIN  1 inch Topical Q6H  . pantoprazole sodium  40 mg Per Tube BID  . pneumococcal 23 valent vaccine  0.5 mL Intramuscular Tomorrow-1000   Continuous Infusions: . feeding supplement (OSMOLITE 1.5 CAL) 1,000 mL (09/26/20 0906)  . methocarbamol (ROBAXIN) IV 500 mg (09/26/20 0337)  . TPN ADULT (ION) 45 mL/hr at 09/25/20 1733   PRN  Meds:.acetaminophen **OR** acetaminophen, artificial tears, diphenhydrAMINE, hydrALAZINE, labetalol, methocarbamol (ROBAXIN) IV, naloxone **AND** sodium chloride flush, [DISCONTINUED] ondansetron **OR** ondansetron (ZOFRAN) IV, sodium chloride flush   PHYSICAL EXAM: Vital signs: Vitals:   09/25/20 2359 09/26/20 0346 09/26/20 0431 09/26/20 0723  BP:   (!) 147/84   Pulse:   85   Resp: 14 14 14 16   Temp:   97.8 F (36.6 C)   TempSrc:   Oral   SpO2: 93% 95% 95% 95%  Weight:      Height:       Filed Weights   09/13/20 0811 09/13/20 0900  Weight: 77.2 kg 77.2 kg   Body mass index is 31.13 kg/m.   Gen Exam:Alert awake-not in any distress HEENT:atraumatic, normocephalic Chest: B/L clear to auscultation anteriorly CVS:S1S2 regular Abdomen:soft non tender, non distended Extremities:no edema Neurology: Non focal Skin: no rash  I have personally reviewed following labs and imaging studies  LABORATORY DATA: CBC: Recent Labs  Lab 09/20/20 0259 09/21/20 1002 09/22/20 0458 09/23/20 0409 09/24/20 0406  WBC 15.5* 24.6* 21.8* 18.1* 14.2*  NEUTROABS 9.9*  --   --  13.1*  --   HGB 11.1* 10.9* 9.1* 9.3* 9.6*  HCT 33.8* 33.2* 25.8* 27.4* 29.6*  MCV 95.5 94.9 98.5 97.2 96.1  PLT 297 235 207 92* 086    Basic Metabolic Panel: Recent Labs  Lab 09/20/20 0259 09/21/20 1002 09/22/20 0458 09/23/20 0409 09/24/20 0406 09/26/20 0418  NA 135 136 138 142 143 138  K 4.3 4.5 4.2 3.6 3.9 4.2  CL 101 103 104 107 108 102  CO2 28 26 30 30 27 27   GLUCOSE 115* 175* 171* 154* 143* 148*  BUN 27* 26* 27* 24* 21 27*  CREATININE 0.90 0.79 0.88 0.85 0.82 0.94  CALCIUM 9.1 8.9 8.8* 8.9 9.2 8.9  MG 2.0  --   --  2.1  --  2.1  PHOS  --   --   --  2.9  --  4.9*    GFR: Estimated Creatinine Clearance: 69.5 mL/min (by C-G formula based on SCr of 0.94 mg/dL).  Liver Function Tests: Recent Labs  Lab 09/20/20 0259 09/21/20 1002 09/22/20 0458 09/23/20 0409 09/26/20 0418  AST 40 36 27 24 31    ALT 58* 55* 43 34 48*  ALKPHOS 64 67 67 69 95  BILITOT 0.5 0.6 0.4 0.4 0.7  PROT 6.1* 5.9* 5.9* 6.1* 5.8*  ALBUMIN 2.9* 2.7* 2.7* 2.5* 2.4*   No results for input(s): LIPASE, AMYLASE in the last 168 hours. No results for input(s): AMMONIA in the last 168 hours.  Coagulation Profile: No results for input(s): INR, PROTIME in the last 168 hours.  Cardiac Enzymes: No results for input(s): CKTOTAL, CKMB, CKMBINDEX, TROPONINI in the last 168 hours.  BNP (last 3 results) No results for input(s): PROBNP in the last 8760 hours.  Lipid Profile: No  results for input(s): CHOL, HDL, LDLCALC, TRIG, CHOLHDL, LDLDIRECT in the last 72 hours.  Thyroid Function Tests: No results for input(s): TSH, T4TOTAL, FREET4, T3FREE, THYROIDAB in the last 72 hours.  Anemia Panel: No results for input(s): VITAMINB12, FOLATE, FERRITIN, TIBC, IRON, RETICCTPCT in the last 72 hours.  Urine analysis: No results found for: COLORURINE, APPEARANCEUR, LABSPEC, PHURINE, GLUCOSEU, HGBUR, BILIRUBINUR, KETONESUR, PROTEINUR, UROBILINOGEN, NITRITE, LEUKOCYTESUR  Sepsis Labs: Lactic Acid, Venous No results found for: LATICACIDVEN  MICROBIOLOGY: No results found for this or any previous visit (from the past 240 hour(s)).  RADIOLOGY STUDIES/RESULTS: No results found.   LOS: 14 days   Oren Binet, MD  [facility]    To contact the attending provider between 7A-7P or the covering provider during after hours 7P-7A, please log into the web site [email or web address] and access using universal  password for that web site. If you do not have the password, please call the hospital operator.  09/26/2020, 12:24 PM

## 2020-09-26 NOTE — Progress Notes (Signed)
Patient complaining of acid reflux despite Ng tube being flushed, and being medicated with scheduled protonix last night. MD paged to notify awaiting callback. Stat 12 lead EKG shows normal sinus rhythm.   0530 No callback received from MD as of yet. Patient states acid reflux has resolved at this time. Will continue to monitor patient.

## 2020-09-26 NOTE — Progress Notes (Signed)
6 Days Post-Op  Subjective: CC: Doing well. Reports that he has some soreness along his incision that is well controlled with PCA. He used PCA 6 times yesterday and has used it 2 times this am. No nausea. Started passing flatus this am. No BM. NGT to LIWS w/ 200cc output in last 24 hours. Had some reflux overnight but after coughing spell where he got some yellow phlegm up this resolved. No sob. Per patient he is pulling 2000 on IS. Mobilizing. Voiding.   Objective: Vital signs in last 24 hours: Temp:  [97.5 F (36.4 C)-97.8 F (36.6 C)] 97.8 F (36.6 C) (05/12 0431) Pulse Rate:  [74-94] 85 (05/12 0431) Resp:  [12-19] 16 (05/12 0723) BP: (101-165)/(79-93) 147/84 (05/12 0431) SpO2:  [93 %-97 %] 95 % (05/12 0723) Last BM Date: 09/15/20  Intake/Output from previous day: 05/11 0701 - 05/12 0700 In: 1590 [I.V.:460; NG/GT:1039] Out: 2815 [Urine:1415; Emesis/NG output:1320; Drains:80] Intake/Output this shift: Total I/O In: -  Out: 400 [Urine:200; Emesis/NG output:200]  PE: Gen: Alert, NAD, pleasant Heart: Reg Pulm: CTA b/l, normal rate and effort  Abd: Soft,mild distension, more active BS,appropriately tender around incision. Midline incision with staples present and no erythema or drainage. Drain serosanguinous fluid in bulb. J-tube in place with TF running at 40cc/hr. NG tube in placeon LIWS Msk: No LE edema   Lab Results:  Recent Labs    09/24/20 0406  WBC 14.2*  HGB 9.6*  HCT 29.6*  PLT 214   BMET Recent Labs    09/24/20 0406 09/26/20 0418  NA 143 138  K 3.9 4.2  CL 108 102  CO2 27 27  GLUCOSE 143* 148*  BUN 21 27*  CREATININE 0.82 0.94  CALCIUM 9.2 8.9   PT/INR No results for input(s): LABPROT, INR in the last 72 hours. CMP     Component Value Date/Time   NA 138 09/26/2020 0418   K 4.2 09/26/2020 0418   CL 102 09/26/2020 0418   CO2 27 09/26/2020 0418   GLUCOSE 148 (H) 09/26/2020 0418   BUN 27 (H) 09/26/2020 0418   CREATININE 0.94  09/26/2020 0418   CALCIUM 8.9 09/26/2020 0418   PROT 5.8 (L) 09/26/2020 0418   ALBUMIN 2.4 (L) 09/26/2020 0418   AST 31 09/26/2020 0418   ALT 48 (H) 09/26/2020 0418   ALKPHOS 95 09/26/2020 0418   BILITOT 0.7 09/26/2020 0418   GFRNONAA >60 09/26/2020 0418   Lipase  No results found for: LIPASE     Studies/Results: No results found.  Anti-infectives: Anti-infectives (From admission, onward)   Start     Dose/Rate Route Frequency Ordered Stop   09/20/20 1100  metroNIDAZOLE (FLAGYL) IVPB 500 mg        500 mg 100 mL/hr over 60 Minutes Intravenous To Short Stay 09/20/20 1049 09/20/20 1355   09/20/20 0600  ceFAZolin (ANCEF) IVPB 2g/100 mL premix        2 g 200 mL/hr over 30 Minutes Intravenous On call to O.R. 09/19/20 0940 09/20/20 1324       Assessment/Plan GOO from a gastric cancer at the antrum S/p ex lap,GJ bypass J tubeplace, placement of surgical drain - Dr. Zenia Resides - 09/20/20 - POD #6 -OR findings:Unresectable locally advanced tumor in the gastric antrum, invading the head of the pancreas. No evidence of metastatic disease within the abdomen.  - Continue PCAfor now. Will discuss w/ MD about starting meds via J tube and transitioning off PCA.  - 1/2 TNA. TF's currently  at 40cc/hr and increasing to goal today.  - Patient with ROBF this am. Clamping trial of NGT. Discussed with RN indications to rehook [employer].PTrec no follow up - Onc following - Dr. Marin Olp - Cont surgical drain(was placed around pancreas) - Pulm toilet  FEN -1/2 TPN, NGT clamped, NPO, J-tubeTF's to goal,IVF per TRH VTE -SCDs, hep subq ID -Ance/flagylf periop Foley -out, voiding   LOS: 14 days    Jillyn Ledger , [facility] 09/26/2020, 9:26 AM Please see Amion for pager number during day hours 7:00am-4:30pm

## 2020-09-27 LAB — GLUCOSE, CAPILLARY
Glucose-Capillary: 145 mg/dL — ABNORMAL HIGH (ref 70–99)
Glucose-Capillary: 146 mg/dL — ABNORMAL HIGH (ref 70–99)
Glucose-Capillary: 124 mg/dL — ABNORMAL HIGH (ref 70–99)
Glucose-Capillary: 133 mg/dL — ABNORMAL HIGH (ref 70–99)

## 2020-09-27 NOTE — Progress Notes (Signed)
[facility] Progress Note  7 Days Post-Op  Subjective: CC-  NG tube has been clamped 24 hours. He is passing flatus and had BM x2 yesterday. He complains of some heartburn and feeling phlegm in his throat.  Objective: Vital signs in last 24 hours: Temp:  [97.8 F (36.6 C)-99.8 F (37.7 C)] 98.3 F (36.8 C) (05/13 0757) Pulse Rate:  [80-90] 82 (05/13 0757) Resp:  [15-20] 18 (05/13 0757) BP: (128-158)/(78-94) 131/85 (05/13 0757) SpO2:  [92 %-96 %] 94 % (05/13 0757) Weight:  [77.2 kg] 77.2 kg (05/13 0340) Last BM Date: 09/26/20  Intake/Output from previous day: 05/12 0701 - 05/13 0700 In: 120 [P.O.:120] Out: 1135 [Urine:425; Emesis/NG output:200; Drains:110] Intake/Output this shift: No intake/output data recorded.  PE: Gen: Alert, NAD, pleasant Pulm:rate and effort normal Abd: Soft, minimal distension, + BS,appropriately tender around incision. Midlineincision with staples present and no erythema or drainage. Drain serosanguinousfluid in bulb. J-tube in placewith TF running at 55cc/hr  Lab Results:  No results for input(s): WBC, HGB, HCT, PLT in the last 72 hours. BMET Recent Labs    09/26/20 0418  NA 138  K 4.2  CL 102  CO2 27  GLUCOSE 148*  BUN 27*  CREATININE 0.94  CALCIUM 8.9   PT/INR No results for input(s): LABPROT, INR in the last 72 hours. CMP     Component Value Date/Time   NA 138 09/26/2020 0418   K 4.2 09/26/2020 0418   CL 102 09/26/2020 0418   CO2 27 09/26/2020 0418   GLUCOSE 148 (H) 09/26/2020 0418   BUN 27 (H) 09/26/2020 0418   CREATININE 0.94 09/26/2020 0418   CALCIUM 8.9 09/26/2020 0418   PROT 5.8 (L) 09/26/2020 0418   ALBUMIN 2.4 (L) 09/26/2020 0418   AST 31 09/26/2020 0418   ALT 48 (H) 09/26/2020 0418   ALKPHOS 95 09/26/2020 0418   BILITOT 0.7 09/26/2020 0418   GFRNONAA >60 09/26/2020 0418   Lipase  No results found for: LIPASE     Studies/Results: No results found.  Anti-infectives: Anti-infectives  (From admission, onward)   Start     Dose/Rate Route Frequency Ordered Stop   09/20/20 1100  metroNIDAZOLE (FLAGYL) IVPB 500 mg        500 mg 100 mL/hr over 60 Minutes Intravenous To Short Stay 09/20/20 1049 09/20/20 1355   09/20/20 0600  ceFAZolin (ANCEF) IVPB 2g/100 mL premix        2 g 200 mL/hr over 30 Minutes Intravenous On call to O.R. 09/19/20 0940 09/20/20 1324       Assessment/Plan GOO from a gastric cancer at the antrum S/p ex lap,GJ bypass J tubeplace, placement of surgical drain - Dr. Zenia Resides - 09/20/20 - POD #7 -OR findings:Unresectable locally advanced tumor in the gastric antrum, invading the head of the pancreas. No evidence of metastatic disease within the abdomen.  -Mobilize.PTrec no follow up - Onc following - Dr. Marin Olp - Cont surgical drain(was placed around pancreas) - currently serous - J TF at goal (55cc/hr) - Bowel function returning and patient tolerated clamping trial. D/c NG tube today and start CLD. If tolerating this and able to advance diet tomorrow will plan to d/c PCA and transition to oral pain medications.  FEN -J-tubeTF's @ goal,CLD VTE -SCDs, hep subq ID -Ance/flagylf periop Foley -out, voiding    LOS: 15 days    Wellington Hampshire, [facility] 09/27/2020, 8:39 AM Please see Amion for pager number during day hours 7:00am-4:30pm

## 2020-09-27 NOTE — Progress Notes (Signed)
PROGRESS NOTE        PATIENT DETAILS Name: Alexander [name] Age: 65 y.o. Sex: male Date of Birth: [birth year] Admit Date: 09/12/2020 Admitting Physician Lenore Cordia, MD QIH:[id number], Anderson Malta, MD  Brief Narrative: Patient is a 65 y.o. male with history of COPD, HTN, HLD, GERD-who was found to have gastric outlet obstruction due to a newly diagnosed gastric malignancy-he was transferred to [facility] for further evaluation and treatment.  He underwent exploratory laparotomy-GJ bypass and G-tube placement-he unfortunately was found to have a unresectable tumor.  Significant events: 4/28>> transfer from [place] to [facility] for evaluation of gastric outlet obstruction due to malignancy.  Significant studies: None  Antimicrobial therapy: None  Microbiology data: None  Procedures : 5/6>> exploratory laparotomy-gastrojejunostomy-feeding jejunostomy.  Consults: General surgery, oncology  DVT Prophylaxis : heparin injection 5,000 Units Start: 09/24/20 2200 SCDs Start: 09/12/20 2127   Subjective: Had 2 small BMs yesterday.  NG tube discontinued earlier this morning.  Starting clear liquids today.   Assessment/Plan: Gastric outlet obstruction due to gastric adenocarcinoma: Evaluated by general surgery-underwent laparotomy where he was found to have a unresectable gastric tumor-he subsequently underwent gastrojejunostomy and J-tube placement.  Postop care being managed by general surgery-NG tube discontinued-no longer on TNA-started on clear liquids-J-tube feeding ongoing.  Will await further recommendations from general surgical team.  HTN: Stable-continue clonidine patch and Nitropaste.    Upper GI bleeding: This occurred at [facility]-this has resolved-on PPI.  Normocytic anemia: Likely due to acute illness.  Follow CBC periodically.  Thrombocytopenia: Resolved.  HIT antibody was negative.  Obesity: Estimated body mass index  is 31.13 kg/m as calculated from the following:   Height as of this encounter: 5\' 2"  (1.575 m).   Weight as of this encounter: 77.2 kg.    Diet: Diet Order            Diet clear liquid Room service appropriate? Yes; Fluid consistency: Thin  Diet effective now                  Code Status: Full code  Family Communication: Spouse at bedside on 5/11   Disposition Plan: Status is: Inpatient  Remains inpatient appropriate because:Inpatient level of care appropriate due to severity of illness   Dispo: The patient is from: Home              Anticipated d/c is to: Home              Patient currently is not medically stable to d/c.   Difficult to place patient No   Barriers to Discharge: Diet being advanced-not yet stable for discharge.  Antimicrobial agents: Anti-infectives (From admission, onward)   Start     Dose/Rate Route Frequency Ordered Stop   09/20/20 1100  metroNIDAZOLE (FLAGYL) IVPB 500 mg        500 mg 100 mL/hr over 60 Minutes Intravenous To Short Stay 09/20/20 1049 09/20/20 1355   09/20/20 0600  ceFAZolin (ANCEF) IVPB 2g/100 mL premix        2 g 200 mL/hr over 30 Minutes Intravenous On call to O.R. 09/19/20 0940 09/20/20 1324       Time spent: 25 minutes-Greater than 50% of this time was spent in counseling, explanation of diagnosis, planning of further management, and coordination of care.  MEDICATIONS: Scheduled Meds: . Chlorhexidine Gluconate Cloth  6 each Topical Daily  . cloNIDine  0.3 mg Transdermal Q Fri  . feeding supplement (PROSource TF)  45 mL Per Tube BID  . free water  150 mL Per Tube Q4H  . heparin injection (subcutaneous)  5,000 Units Subcutaneous Q8H  . HYDROmorphone   Intravenous Q4H  . multivitamin with minerals  1 tablet Per Tube QHS  . nitroGLYCERIN  1 inch Topical Q6H  . pantoprazole sodium  40 mg Per Tube BID  . pneumococcal 23 valent vaccine  0.5 mL Intramuscular Tomorrow-1000   Continuous Infusions: . feeding supplement  (OSMOLITE 1.5 CAL) 1,000 mL (09/27/20 0914)  . methocarbamol (ROBAXIN) IV 500 mg (09/27/20 0046)   PRN Meds:.acetaminophen **OR** acetaminophen, artificial tears, diphenhydrAMINE, hydrALAZINE, labetalol, methocarbamol (ROBAXIN) IV, naloxone **AND** sodium chloride flush, [DISCONTINUED] ondansetron **OR** ondansetron (ZOFRAN) IV, sodium chloride flush   PHYSICAL EXAM: Vital signs: Vitals:   09/27/20 0757 09/27/20 0841 09/27/20 1159 09/27/20 1322  BP: 131/85  (!) 144/93   Pulse: 82  84   Resp: 18 15 18 14   Temp: 98.3 F (36.8 C)  98.3 F (36.8 C)   TempSrc: Oral  Oral   SpO2: 94% 93% 93% 93%  Weight:      Height:       Filed Weights   09/13/20 0811 09/13/20 0900 09/27/20 0340  Weight: 77.2 kg 77.2 kg 77.2 kg   Body mass index is 31.13 kg/m.   Gen Exam:Alert awake-not in any distress HEENT:atraumatic, normocephalic Chest: B/L clear to auscultation anteriorly CVS:S1S2 regular Abdomen:soft non tender, non distended Extremities:no edema Neurology: Non focal Skin: no rash  I have personally reviewed following labs and imaging studies  LABORATORY DATA: CBC: Recent Labs  Lab 09/21/20 1002 09/22/20 0458 09/23/20 0409 09/24/20 0406  WBC 24.6* 21.8* 18.1* 14.2*  NEUTROABS  --   --  13.1*  --   HGB 10.9* 9.1* 9.3* 9.6*  HCT 33.2* 25.8* 27.4* 29.6*  MCV 94.9 98.5 97.2 96.1  PLT 235 207 92* 503    Basic Metabolic Panel: Recent Labs  Lab 09/21/20 1002 09/22/20 0458 09/23/20 0409 09/24/20 0406 09/26/20 0418  NA 136 138 142 143 138  K 4.5 4.2 3.6 3.9 4.2  CL 103 104 107 108 102  CO2 26 30 30 27 27   GLUCOSE 175* 171* 154* 143* 148*  BUN 26* 27* 24* 21 27*  CREATININE 0.79 0.88 0.85 0.82 0.94  CALCIUM 8.9 8.8* 8.9 9.2 8.9  MG  --   --  2.1  --  2.1  PHOS  --   --  2.9  --  4.9*    GFR: Estimated Creatinine Clearance: 69.5 mL/min (by C-G formula based on SCr of 0.94 mg/dL).  Liver Function Tests: Recent Labs  Lab 09/21/20 1002 09/22/20 0458 09/23/20 0409  09/26/20 0418  AST 36 27 24 31   ALT 55* 43 34 48*  ALKPHOS 67 67 69 95  BILITOT 0.6 0.4 0.4 0.7  PROT 5.9* 5.9* 6.1* 5.8*  ALBUMIN 2.7* 2.7* 2.5* 2.4*   No results for input(s): LIPASE, AMYLASE in the last 168 hours. No results for input(s): AMMONIA in the last 168 hours.  Coagulation Profile: No results for input(s): INR, PROTIME in the last 168 hours.  Cardiac Enzymes: No results for input(s): CKTOTAL, CKMB, CKMBINDEX, TROPONINI in the last 168 hours.  BNP (last 3 results) No results for input(s): PROBNP in the last 8760 hours.  Lipid Profile: No results for input(s): CHOL, HDL, LDLCALC, TRIG, CHOLHDL, LDLDIRECT in the last  72 hours.  Thyroid Function Tests: No results for input(s): TSH, T4TOTAL, FREET4, T3FREE, THYROIDAB in the last 72 hours.  Anemia Panel: No results for input(s): VITAMINB12, FOLATE, FERRITIN, TIBC, IRON, RETICCTPCT in the last 72 hours.  Urine analysis: No results found for: COLORURINE, APPEARANCEUR, LABSPEC, PHURINE, GLUCOSEU, HGBUR, BILIRUBINUR, KETONESUR, PROTEINUR, UROBILINOGEN, NITRITE, LEUKOCYTESUR  Sepsis Labs: Lactic Acid, Venous No results found for: LATICACIDVEN  MICROBIOLOGY: No results found for this or any previous visit (from the past 240 hour(s)).  RADIOLOGY STUDIES/RESULTS: No results found.   LOS: 15 days   Oren Binet, MD  [facility]    To contact the attending provider between 7A-7P or the covering provider during after hours 7P-7A, please log into the web site [email or web address] and access using universal [place] password for that web site. If you do not have the password, please call the hospital operator.  09/27/2020, 2:08 PM

## 2020-09-27 NOTE — Progress Notes (Signed)
Nutrition Follow-up  DOCUMENTATION CODES:   Obesity unspecified  INTERVENTION:   -Continue Osmolite 1.5@ 32m/hr via j-tube   463mProsource TF BID.   150 ml free water flush every 4 hours  Tube feeding regimen provides2060kcal (100% of needs),105grams of protein, and 100641mf H2O.Total free water: 1906 ml daily.  NUTRITION DIAGNOSIS:   Increased nutrient needs related to chronic illness (gastric cancer) as evidenced by estimated needs.  Ongoing  GOAL:   Patient will meet greater than or equal to 90% of their needs  Met with TF  MONITOR:   PO intake,Diet advancement,Supplement acceptance,Labs,Weight trends,TF tolerance,Skin,I & O's  REASON FOR ASSESSMENT:   Consult Enteral/tube feeding initiation and management  ASSESSMENT:   Alexander [name] is a 65 80o. male with medical history significant for COPD, HTN, HLD, GERD who [facility] newly diagnosed [facility] malignancy with gastric outlet obstruction, [facility] and [facility] showing gastric adenocarcinoma, NG tube was placed [facility].  4/22- NGT placed, connected to low, intermittent suction 4/29- PICC placed, TPN initiated 5/6- s/pProcedure(s): FEEDING JEJUNOSTOMY (N/A) GASTROJEJUNOSTOMY; NGT replaced- connected to low, intermittent suction 5/10- TF initiated 5/12- TPN d/c 5/13- d/c NGT, advanced to clear liquid diet  Reviewed I/O's: -1 L x 24 hours and -3.8 L since admission  UOP: 425 ml x 24 hours  NGT output: 200 ml x 24 hours   Drain output: 110 ml x 24 hours  Per general surgery notes, OR findings reveal "Unresectable locally advanced tumor in the gastric antrum, invading the head of the pancreas. No evidence of metastatic disease within the abdomen".  Per MD notes, pt is able to tolerate clear liquids, will advance diet, d/c PCA, and transition to  oral pain medications.   Pt receiving nursing care at time of visit.   TF now at goal. Pt tolerating well.  Labs reviewed: CBGS: 124-146 (inpatient orders for glycemic control are none).   Diet Order:   Diet Order            Diet clear liquid Room service appropriate? Yes; Fluid consistency: Thin  Diet effective now                 EDUCATION NEEDS:   Education needs have been addressed  Skin:  Skin Assessment: Skin Integrity Issues: Skin Integrity Issues:: Incisions Incisions: closed abdomen  Last BM:  09/27/19  Height:   Ht Readings from Last 1 Encounters:  09/13/20 _0  (1.575 m)    Weight:   Wt Readings from Last 1 Encounters:  09/27/20 77.2 kg    Ideal Body Weight:  53.6 kg  BMI:  Body mass index is 31.13 kg/m.  Estimated Nutritional Needs:   Kcal:  1900-2100  Protein:  100-120 gm  Fluid:  > 1.9 L    JenLoistine ChanceD, LDN, CDCArkportgistered Dietitian II Certified Diabetes Care and Education Specialist Please refer to [facility] and/or RD on-call/weekend/after hours pager

## 2020-09-27 NOTE — Care Management Important Message (Signed)
Important Message  Patient Details  Name: Alexander [name] MRN: [id number] Date of Birth: Jan 18, 1956   Medicare Important Message Given:  Yes - Important Message mailed due to current National Emergency  Verbal consent obtained due to current National Emergency  Relationship to patient: Spouse/Significant Other Contact Name: Neoma Laming Call Date: 09/27/20  Time: 1155 Phone: [phone] Outcome: Spoke with contact Important Message mailed to: Patient address on file    Delorse Lek 09/27/2020, 11:55 AM

## 2020-09-27 NOTE — Progress Notes (Signed)
Physical Therapy Treatment Patient Details Name: Alexander [name] MRN: [id number] DOB: 06/09/1955 Today's Date: 09/27/2020    History of Present Illness 65yo male admitted 09/12/20 via transfer from [facility] secondary to gastic malignancy with gastric outlet obstruction. Received EGD and NGT 09/06/20. Pt underwent jejunostomy and gastrojejunostomy on 5/6 with J tube placement, unable to resect tumor. PMH COPD.    PT Comments    Patient received in recliner, very pleasant and feeling much better, excited that he has started clear diet/NG tube out! Needed some assist with line set up/drain management, however from there able to manage IV pole and lines for gait in the hallway without difficulty. Tolerated increased gait distance with no SOB or difficulty today. SPO2 94% on room air upon return to room. Left up in recliner with all needs met, spouse present. Will continue to follow- still need to practice steps before DC.    Follow Up Recommendations  No PT follow up     Equipment Recommendations  None recommended by PT    Recommendations for Other Services       Precautions / Restrictions Precautions Precautions: Fall;Other (comment) Precaution Comments: J tube, JP drain, pain pump, CO2 monitoring, abdominal incision Restrictions Weight Bearing Restrictions: No    Mobility  Bed Mobility               General bed mobility comments: Pt in recliner upon arrival.    Transfers Overall transfer level: Needs assistance Equipment used: None             General transfer comment: no physical assist given, did assist in line management  Ambulation/Gait Ambulation/Gait assistance: Modified independent (Device/Increase time) Gait Distance (Feet): 240 Feet Assistive device: IV Pole Gait Pattern/deviations: Step-through pattern;Decreased stride length;Trunk flexed;Antalgic Gait velocity: decreased   General Gait Details: clearly feeling much better today-  improved gait distance and gait speed today, also was much less short of breath on room air. good safety awareness and able to manage IV pole/lines well   Stairs             Wheelchair Mobility    Modified Rankin (Stroke Patients Only)       Balance Overall balance assessment: Independent                                          Cognition Arousal/Alertness: Awake/alert Behavior During Therapy: WFL for tasks assessed/performed Overall Cognitive Status: Within Functional Limits for tasks assessed                                        Exercises      General Comments        Pertinent Vitals/Pain Pain Assessment: Faces Faces Pain Scale: Hurts little more Pain Location: abdominal area Pain Descriptors / Indicators: Operative site guarding;Sore;Tightness Pain Intervention(s): Limited activity within patient's tolerance;Monitored during session    Home Living                      Prior Function            PT Goals (current goals can now be found in the care plan section) Acute Rehab PT Goals Patient Stated Goal: Go home PT Goal Formulation: With patient Time For Goal Achievement: 10/01/20 Potential to Achieve  Goals: Good Progress towards PT goals: Progressing toward goals    Frequency    Min 3X/week      PT Plan Current plan remains appropriate    Co-evaluation              AM-PAC PT "6 Clicks" Mobility   Outcome Measure  Help needed turning from your back to your side while in a flat bed without using bedrails?: None Help needed moving from lying on your back to sitting on the side of a flat bed without using bedrails?: None Help needed moving to and from a bed to a chair (including a wheelchair)?: None Help needed standing up from a chair using your arms (e.g., wheelchair or bedside chair)?: None Help needed to walk in hospital room?: None Help needed climbing 3-5 steps with a railing? : A  Little 6 Click Score: 23    End of Session   Activity Tolerance: Patient tolerated treatment well Patient left: in chair;with call bell/phone within reach;with family/visitor present Nurse Communication: Mobility status PT Visit Diagnosis: Other abnormalities of gait and mobility (R26.89);Pain Pain - Right/Left:  (abdomen) Pain - part of body:  (abdomen)     Time: 1610-9604 PT Time Calculation (min) (ACUTE ONLY): 19 min  Charges:  $Gait Training: 8-22 mins                     Windell Norfolk, DPT, PN1   Supplemental Physical Therapist [place]    Pager [phone] Acute Rehab Office [phone]

## 2020-09-28 ENCOUNTER — Inpatient Hospital Stay (HOSPITAL_COMMUNITY): Payer: PPO

## 2020-09-28 DIAGNOSIS — C169 Malignant neoplasm of stomach, unspecified: Secondary | ICD-10-CM | POA: Diagnosis not present

## 2020-09-28 LAB — BASIC METABOLIC PANEL
Anion gap: 7 (ref 5–15)
Anion gap: 4 — ABNORMAL LOW (ref 5–15)
BUN: 25 mg/dL — ABNORMAL HIGH (ref 8–23)
BUN: 23 mg/dL (ref 8–23)
CO2: 27 mmol/L (ref 22–32)
CO2: 29 mmol/L (ref 22–32)
Calcium: 7.9 mg/dL — ABNORMAL LOW (ref 8.9–10.3)
Calcium: 8.1 mg/dL — ABNORMAL LOW (ref 8.9–10.3)
Chloride: 95 mmol/L — ABNORMAL LOW (ref 98–111)
Chloride: 97 mmol/L — ABNORMAL LOW (ref 98–111)
Creatinine, Ser: 0.89 mg/dL (ref 0.61–1.24)
Creatinine, Ser: 0.95 mg/dL (ref 0.61–1.24)
GFR, Estimated: >60 mL/min (ref >60)
GFR, Estimated: >60 mL/min (ref >60)
Glucose, Bld: 111 mg/dL — ABNORMAL HIGH (ref 70–99)
Glucose, Bld: 95 mg/dL (ref 70–99)
Potassium: 4.6 mmol/L (ref 3.5–5.1)
Potassium: 4 mmol/L (ref 3.5–5.1)
Sodium: 129 mmol/L — ABNORMAL LOW (ref 135–145)
Sodium: 130 mmol/L — ABNORMAL LOW (ref 135–145)

## 2020-09-28 LAB — CBC
HCT: 26.4 % — ABNORMAL LOW (ref 39.0–52.0)
Hemoglobin: 9.5 g/dL — ABNORMAL LOW (ref 13.0–17.0)
MCH: 34.3 pg — ABNORMAL HIGH (ref 26.0–34.0)
MCHC: 36 g/dL (ref 30.0–36.0)
MCV: 95.3 fL (ref 80.0–100.0)
Platelets: 262 10*3/uL (ref 150–400)
RBC: 2.77 MIL/uL — ABNORMAL LOW (ref 4.22–5.81)
RDW: 14.6 % (ref 11.5–15.5)
WBC: 14.7 10*3/uL — ABNORMAL HIGH (ref 4.0–10.5)
nRBC: 0 % (ref 0.0–0.2)

## 2020-09-28 LAB — IRON AND TIBC
Iron: 34 ug/dL — ABNORMAL LOW (ref 45–182)
Saturation Ratios: 13 % — ABNORMAL LOW (ref 17.9–39.5)
TIBC: 262 ug/dL (ref 250–450)
UIBC: 228 ug/dL

## 2020-09-28 LAB — GLUCOSE, CAPILLARY
Glucose-Capillary: 99 mg/dL (ref 70–99)
Glucose-Capillary: 79 mg/dL (ref 70–99)
Glucose-Capillary: 119 mg/dL — ABNORMAL HIGH (ref 70–99)

## 2020-09-28 MED ORDER — MORPHINE SULFATE (PF) 2 MG/ML IV SOLN: 1.0000 mg | INTRAVENOUS | Status: DC | PRN

## 2020-09-28 MED ORDER — OXYCODONE HCL 5 MG PO TABS
5.0000 mg | ORAL_TABLET | ORAL | Status: DC | PRN
Start: 2020-09-28 — End: 2020-09-30
  Administered 2020-09-28: 5 mg via ORAL
  Filled 2020-09-28: qty 1

## 2020-09-28 MED ORDER — OSMOLITE 1.5 CAL PO LIQD
1200.0000 mL | ORAL | Status: DC
  Administered 2020-09-28 – 2020-10-01 (×4): 1200 mL
  Filled 2020-09-28 (×5): qty 2000

## 2020-09-28 MED ORDER — PROSOURCE TF PO LIQD
45.0000 mL | Freq: Three times a day (TID) | ORAL | Status: DC
  Administered 2020-09-28 – 2020-10-02 (×12): 45 mL
  Filled 2020-09-28 (×15): qty 45

## 2020-09-28 MED ORDER — BOOST / RESOURCE BREEZE PO LIQD CUSTOM
1.0000 | Freq: Two times a day (BID) | ORAL | Status: DC
  Administered 2020-09-28: 1 via ORAL

## 2020-09-28 MED ORDER — METOCLOPRAMIDE HCL 5 MG/ML IJ SOLN
5.0000 mg | Freq: Three times a day (TID) | INTRAMUSCULAR | Status: AC
  Administered 2020-09-28 (×3): 5 mg via INTRAVENOUS
  Filled 2020-09-28 (×3): qty 2

## 2020-09-28 NOTE — Progress Notes (Signed)
PROGRESS NOTE        PATIENT DETAILS Name: Alexander [name] Age: 65 y.o. Sex: male Date of Birth: 03/05/1956 Admit Date: 09/12/2020 Admitting Physician Lenore Cordia, MD PYP:[id number], Anderson Malta, MD  Brief Narrative: Patient is a 65 y.o. male with history of COPD, HTN, HLD, GERD-who was found to have gastric outlet obstruction due to a newly diagnosed gastric malignancy-he was transferred to [facility] for further evaluation and treatment.  He underwent exploratory laparotomy-GJ bypass and G-tube placement-he unfortunately was found to have a unresectable tumor.  Significant events: 4/28>> transfer from [place] to [facility] for evaluation of gastric outlet obstruction due to malignancy.  Significant studies: None  Antimicrobial therapy: None  Microbiology data: None  Procedures : 5/6>> exploratory laparotomy-gastrojejunostomy-feeding jejunostomy.  Consults: General surgery, oncology  DVT Prophylaxis : heparin injection 5,000 Units Start: 09/24/20 2200 SCDs Start: 09/12/20 2127   Subjective: Vomited last night having some hiccups.   Assessment/Plan: Gastric outlet obstruction due to gastric adenocarcinoma: Evaluated by general surgery-underwent laparotomy where he was found to have a unresectable gastric tumor-he subsequently underwent gastrojejunostomy and J-tube placement.  NG tube has been removed-no longer on TNA-General surgery following with plans to switch to nocturnal feeding.   HTN: Stable-continue clonidine patch and Nitropaste.    Upper GI bleeding: This occurred at [facility]-this has resolved-on PPI.  Normocytic anemia: Likely due to acute illness.  Follow CBC periodically.  Thrombocytopenia: Resolved.  HIT antibody was negative.  Obesity: Estimated body mass index is 30.97 kg/m as calculated from the following:   Height as of this encounter: 5\' 2"  (1.575 m).   Weight as of this encounter: 76.8 kg.     Diet: Diet Order            Diet clear liquid Room service appropriate? Yes; Fluid consistency: Thin  Diet effective now                  Code Status: Full code  Family Communication: Spouse at bedside on 5/11   Disposition Plan: Status is: Inpatient  Remains inpatient appropriate because:Inpatient level of care appropriate due to severity of illness   Dispo: The patient is from: Home              Anticipated d/c is to: Home              Patient currently is not medically stable to d/c.   Difficult to place patient No   Barriers to Discharge: Diet being advanced-not yet stable for discharge.  Antimicrobial agents: Anti-infectives (From admission, onward)   Start     Dose/Rate Route Frequency Ordered Stop   09/20/20 1100  metroNIDAZOLE (FLAGYL) IVPB 500 mg        500 mg 100 mL/hr over 60 Minutes Intravenous To Short Stay 09/20/20 1049 09/20/20 1355   09/20/20 0600  ceFAZolin (ANCEF) IVPB 2g/100 mL premix        2 g 200 mL/hr over 30 Minutes Intravenous On call to O.R. 09/19/20 0940 09/20/20 1324       Time spent: 25 minutes-Greater than 50% of this time was spent in counseling, explanation of diagnosis, planning of further management, and coordination of care.  MEDICATIONS: Scheduled Meds: . Chlorhexidine Gluconate Cloth  6 each Topical Daily  . cloNIDine  0.3 mg Transdermal Q Fri  . feeding supplement (PROSource  TF)  45 mL Per Tube BID  . free water  150 mL Per Tube Q4H  . heparin injection (subcutaneous)  5,000 Units Subcutaneous Q8H  . metoCLOPramide (REGLAN) injection  5 mg Intravenous Q8H  . multivitamin with minerals  1 tablet Per Tube QHS  . nitroGLYCERIN  1 inch Topical Q6H  . pantoprazole sodium  40 mg Per Tube BID  . pneumococcal 23 valent vaccine  0.5 mL Intramuscular Tomorrow-1000   Continuous Infusions: . feeding supplement (OSMOLITE 1.5 CAL) 1,000 mL (09/27/20 0914)  . methocarbamol (ROBAXIN) IV 500 mg (09/27/20 0046)   PRN  Meds:.acetaminophen **OR** acetaminophen, artificial tears, hydrALAZINE, labetalol, methocarbamol (ROBAXIN) IV, morphine injection, [DISCONTINUED] ondansetron **OR** ondansetron (ZOFRAN) IV, oxyCODONE, sodium chloride flush   PHYSICAL EXAM: Vital signs: Vitals:   09/28/20 0411 09/28/20 0430 09/28/20 0800 09/28/20 1143  BP:  127/78  109/83  Pulse:  74  73  Resp: 15 16 11 12   Temp:  98.7 F (37.1 C)  97.9 F (36.6 C)  TempSrc:  Oral  Oral  SpO2: 96% 97% 93% 93%  Weight:  76.8 kg    Height:       Filed Weights   09/13/20 0900 09/27/20 0340 09/28/20 0430  Weight: 77.2 kg 77.2 kg 76.8 kg   Body mass index is 30.97 kg/m.   Gen Exam:Alert awake-not in any distress HEENT:atraumatic, normocephalic Chest: B/L clear to auscultation anteriorly CVS:S1S2 regular Abdomen:soft non tender, non distended Extremities:no edema Neurology: Non focal Skin: no rash  I have personally reviewed following labs and imaging studies  LABORATORY DATA: CBC: Recent Labs  Lab 09/22/20 0458 09/23/20 0409 09/24/20 0406 09/28/20 0356  WBC 21.8* 18.1* 14.2* 14.7*  NEUTROABS  --  13.1*  --   --   HGB 9.1* 9.3* 9.6* 9.5*  HCT 25.8* 27.4* 29.6* 26.4*  MCV 98.5 97.2 96.1 95.3  PLT 207 92* 214 595    Basic Metabolic Panel: Recent Labs  Lab 09/23/20 0409 09/24/20 0406 09/26/20 0418 09/28/20 0356 09/28/20 1043  NA 142 143 138 129* 130*  K 3.6 3.9 4.2 4.6 4.0  CL 107 108 102 95* 97*  CO2 30 27 27 27 29   GLUCOSE 154* 143* 148* 111* 95  BUN 24* 21 27* 25* 23  CREATININE 0.85 0.82 0.94 0.89 0.95  CALCIUM 8.9 9.2 8.9 7.9* 8.1*  MG 2.1  --  2.1  --   --   PHOS 2.9  --  4.9*  --   --     GFR: Estimated Creatinine Clearance: 68.7 mL/min (by C-G formula based on SCr of 0.95 mg/dL).  Liver Function Tests: Recent Labs  Lab 09/22/20 0458 09/23/20 0409 09/26/20 0418  AST 27 24 31   ALT 43 34 48*  ALKPHOS 67 69 95  BILITOT 0.4 0.4 0.7  PROT 5.9* 6.1* 5.8*  ALBUMIN 2.7* 2.5* 2.4*   No  results for input(s): LIPASE, AMYLASE in the last 168 hours. No results for input(s): AMMONIA in the last 168 hours.  Coagulation Profile: No results for input(s): INR, PROTIME in the last 168 hours.  Cardiac Enzymes: No results for input(s): CKTOTAL, CKMB, CKMBINDEX, TROPONINI in the last 168 hours.  BNP (last 3 results) No results for input(s): PROBNP in the last 8760 hours.  Lipid Profile: No results for input(s): CHOL, HDL, LDLCALC, TRIG, CHOLHDL, LDLDIRECT in the last 72 hours.  Thyroid Function Tests: No results for input(s): TSH, T4TOTAL, FREET4, T3FREE, THYROIDAB in the last 72 hours.  Anemia Panel: Recent Labs  09/28/20 1043  TIBC 262  IRON 34*    Urine analysis: No results found for: COLORURINE, APPEARANCEUR, LABSPEC, PHURINE, GLUCOSEU, HGBUR, BILIRUBINUR, KETONESUR, PROTEINUR, UROBILINOGEN, NITRITE, LEUKOCYTESUR  Sepsis Labs: Lactic Acid, Venous No results found for: LATICACIDVEN  MICROBIOLOGY: No results found for this or any previous visit (from the past 240 hour(s)).  RADIOLOGY STUDIES/RESULTS: DG Abd Portable 1V  Result Date: 09/28/2020 CLINICAL DATA:  Bilious emesis EXAM: PORTABLE ABDOMEN - 1 VIEW COMPARISON:  09/17/2020 FINDINGS: Percutaneous gastrojejunostomy tube is identified within the left hemiabdomen. There is a surgical drainage catheter overlying the right abdomen. Skin staples noted. The bowel gas pattern is normal. No radio-opaque calculi or other significant radiographic abnormality are seen. IMPRESSION: Nonobstructive bowel gas pattern. Electronically Signed   By: Kerby Moors M.D.   On: 09/28/2020 10:49     LOS: 16 days   Oren Binet, MD  [facility]    To contact the attending provider between 7A-7P or the covering provider during after hours 7P-7A, please log into the web site [email or web address] and access using universal [place] password for that web site. If you do not have the password, please call the hospital  operator.  09/28/2020, 1:35 PM

## 2020-09-28 NOTE — Progress Notes (Signed)
Patient vomited about 800 mls of bile looking emesis. Tube feeding held. MD paged to notify.

## 2020-09-28 NOTE — Progress Notes (Signed)
Per Dr. Sloan Leiter, hold free water flushes until further instructed due to patients sodium being low.

## 2020-09-28 NOTE — Progress Notes (Signed)
Wasted remainder of dilaudid syringe, 11ml,  into stericycle and witnessed by charge nurse Rudene Anda, RN

## 2020-09-28 NOTE — Progress Notes (Addendum)
Received a call from bedside RN regarding the patient having 1 episode of bile appearing emesis.  He had a bowel movement overnight.  Tube feedings were held and abdominal xray was ordered to further assess.  IV antiemetics PRN.

## 2020-09-28 NOTE — Progress Notes (Signed)
8 Days Post-Op   Subjective/Chief Complaint: Episode of emesis overnight and tube feeds held, passing flatus, no complaints   Objective: Vital signs in last 24 hours: Temp:  [98.3 F (36.8 C)-98.8 F (37.1 C)] 98.7 F (37.1 C) (05/14 0430) Pulse Rate:  [74-84] 74 (05/14 0430) Resp:  [9-18] 16 (05/14 0430) BP: (127-144)/(78-93) 127/78 (05/14 0430) SpO2:  [93 %-98 %] 97 % (05/14 0430) Weight:  [76.8 kg] 76.8 kg (05/14 0430) Last BM Date: 09/26/20  Intake/Output from previous day: 05/13 0701 - 05/14 0700 In: 1754.5 [NG/GT:1754.5] Out: 900 [Emesis/NG output:800; Drains:100] Intake/Output this shift: No intake/output data recorded.  GI soft approp tender wound clean drain serous j tube in place  Lab Results:  Recent Labs    09/28/20 0356  WBC 14.7*  HGB 9.5*  HCT 26.4*  PLT 262   BMET Recent Labs    09/26/20 0418 09/28/20 0356  NA 138 129*  K 4.2 4.6  CL 102 95*  CO2 27 27  GLUCOSE 148* 111*  BUN 27* 25*  CREATININE 0.94 0.89  CALCIUM 8.9 7.9*   PT/INR No results for input(s): LABPROT, INR in the last 72 hours. ABG No results for input(s): PHART, HCO3 in the last 72 hours.  Invalid input(s): PCO2, PO2  Studies/Results: No results found.  Anti-infectives: Anti-infectives (From admission, onward)   Start     Dose/Rate Route Frequency Ordered Stop   09/20/20 1100  metroNIDAZOLE (FLAGYL) IVPB 500 mg        500 mg 100 mL/hr over 60 Minutes Intravenous To Short Stay 09/20/20 1049 09/20/20 1355   09/20/20 0600  ceFAZolin (ANCEF) IVPB 2g/100 mL premix        2 g 200 mL/hr over 30 Minutes Intravenous On call to O.R. 09/19/20 0940 09/20/20 1324      Assessment/Plan: POD 8 s/p ex lap,GJ bypass J tubeplace, placement of surgical drain - Dr. Zenia Resides - 09/20/20 -OR findings:Unresectable locally advanced tumor in the gastric antrum, invading the head of the pancreas. No evidence of metastatic disease within the abdomen.  -Mobilize.PTrec no follow up - Onc  following - Dr. Marin Olp- port to be placed, surgical team can do this prior to dc so I stopped IR order as we have been seeing him, if we cannot for some reason (which I doubt) then will have IR see - Cont surgical drain(was placed around pancreas) - currently serous, this should come out before dc - tube feeds off now- will ask dietary to come do nightly tube feeds to allow him for more freedom to be up during day, await recs to start tonight - dc pca, oral pain meds, will try 24 hours reglan (discussed TD risk). FEN -J-tubeTF'sheld,CLD VTE -SCDs, hep subq ID -Ance/flagylf periop   Rolm Bookbinder 09/28/2020

## 2020-09-28 NOTE — Progress Notes (Signed)
Wasted 6 mg of dilaudid. Witnessed by [employer]

## 2020-09-28 NOTE — Progress Notes (Addendum)
Nutrition Follow-up  DOCUMENTATION CODES:   Obesity unspecified  INTERVENTION:  Provide Boost Breeze po BID, each supplement provides 250 kcal and 9 grams of protein.  Transition to nocturnal tube feeds via J-tube: Osmolite 1.5 formula at rate of 75 ml/hr x 16 hours (1600-0800).  Provide 45 ml Prosource TF TID per tube.  Free water flush 150 ml q 4 hours.   Tube feeding regimen to provide 1920 kcal, 108 grams of protein, and 1812 ml free water.   NUTRITION DIAGNOSIS:   Increased nutrient needs related to chronic illness (gastric cancer) as evidenced by estimated needs; ongoing  GOAL:   Patient will meet greater than or equal to 90% of their needsl met with TF  MONITOR:   PO intake,Diet advancement,Supplement acceptance,Labs,Weight trends,TF tolerance,Skin,I & O's  REASON FOR ASSESSMENT:   Consult Enteral/tube feeding initiation and management  ASSESSMENT:   Alexander [name] is a 65 y.o. male with medical history significant for COPD, HTN, HLD, GERD who was admitted from [facility] for management of newly diagnosed gastric malignancy with gastric outlet obstruction, he had work-up at [facility] which included EGD and biopsy showing gastric adenocarcinoma, NG tube was placed and he was transferred to [facility] for further treatment.  4/22- NGT placed, connected to low, intermittent suction 4/29- PICC placed, TPN initiated 5/6- s/pProcedure(s): FEEDING JEJUNOSTOMY (N/A) GASTROJEJUNOSTOMY; NGT replaced- connected to low, intermittent suction 5/10- TF initiated 5/12- TPN d/c 5/13- d/c NGT, advanced to clear liquid diet  Per general surgery notes, OR findings reveal "Unresectable locally advanced tumor in the gastric antrum, invading the head of the pancreas. No evidence of metastatic disease within the abdomen".  Pt continues on a clear liquid diet. Pt with emesis overnight, thus feeds were paused. RD consulted to transition feeds to nocturnal tube  feeds. RD to order nutritional supplements to aid in PO.   Labs and medications reviewed.   Diet Order:   Diet Order            Diet clear liquid Room service appropriate? Yes; Fluid consistency: Thin  Diet effective now                 EDUCATION NEEDS:   Education needs have been addressed  Skin:  Skin Assessment: Reviewed RN Assessment Skin Integrity Issues:: Incisions Incisions: closed abdomen  Last BM:  5/13  Height:   Ht Readings from Last 1 Encounters:  09/13/20 5' 2" (1.575 m)    Weight:   Wt Readings from Last 1 Encounters:  09/28/20 76.8 kg    Ideal Body Weight:  53.6 kg  BMI:  Body mass index is 30.97 kg/m.  Estimated Nutritional Needs:   Kcal:  1900-2100  Protein:  100-120 gm  Fluid:  > 1.9 L  Alexander Parker, MS, RD, LDN RD pager number/after hours weekend pager number on Amion.

## 2020-09-28 NOTE — Progress Notes (Signed)
I spoke to [name] this morning.  Not exactly sure what the message to me yesterday was about the biopsy.  He never had a biopsy as far as I can tell when he had his surgery here.  The only biopsy that was done was done in [place] at [facility].  I  told him that with his surgery, that the cancer was not resectable.  However, it looks like this was invading into the head of the pancreas.  As such, we might be able to treat this and shrink this in a neoadjuvant protocol.  There does not appear to be any metastatic disease.  He is still in the hospital.  He still has some issues with respect to the feeding tube.  He apparently threw up yesterday.  Again, he will get treated down in  at the [facility].  I spoke to Dr. Bobby Rumpf already.  Since he has not had a biopsy of here, the biopsy done down in  to be sent off for molecular analysis to see with the PD-L1 level is and what the HER2 status is.  He is little bit anemic.  I am sure that he is probably iron deficient.  I will check his iron studies.  Again, he never had a biopsy done appear.  The surgeon did not do one at the time of surgery.  I am not sure as to why a biopsy was not done but I guess she figured that there was already 1 done down in [place].  Hopefully, there will be enough tissue for molecular analysis.  I do think that we might still get a Port-A-Cath into him while he is appear.  It would make life a lot easier for him once he leaves the hospital.  Will be 1 less procedure that he will need as an outpatient.  He is agreeable to this.  Lattie Haw, MD  Romans 5:3-5

## 2020-09-29 LAB — BASIC METABOLIC PANEL
Anion gap: 10 (ref 5–15)
BUN: 25 mg/dL — ABNORMAL HIGH (ref 8–23)
CO2: 26 mmol/L (ref 22–32)
Calcium: 8.5 mg/dL — ABNORMAL LOW (ref 8.9–10.3)
Chloride: 94 mmol/L — ABNORMAL LOW (ref 98–111)
Creatinine, Ser: 0.97 mg/dL (ref 0.61–1.24)
GFR, Estimated: >60 mL/min (ref >60)
Glucose, Bld: 117 mg/dL — ABNORMAL HIGH (ref 70–99)
Potassium: 4.1 mmol/L (ref 3.5–5.1)
Sodium: 130 mmol/L — ABNORMAL LOW (ref 135–145)

## 2020-09-29 MED ORDER — METOCLOPRAMIDE HCL 5 MG/ML IJ SOLN
5.0000 mg | Freq: Three times a day (TID) | INTRAMUSCULAR | Status: AC
  Administered 2020-09-29 (×3): 5 mg via INTRAVENOUS
  Filled 2020-09-29 (×3): qty 2

## 2020-09-29 NOTE — Progress Notes (Signed)
Bedside RN Joneen Caraway reported green to tan emesis 800 mL.  Patient stated that he felt better and was given IV Zofran.  Tube feeds held.  We will continue to closely monitor and treat as indicated.

## 2020-09-29 NOTE — Progress Notes (Signed)
Physical Therapy Treatment Patient Details Name: Alexander [name] MRN: [id number] DOB: Nov 14, 1955 Today's Date: 09/29/2020    History of Present Illness 65yo male admitted 09/12/20 via transfer from [facility] secondary to gastic malignancy with gastric outlet obstruction. Received EGD and NGT 09/06/20. Pt underwent jejunostomy and gastrojejunostomy on 5/6 with J tube placement, unable to resect tumor. PMH COPD.    PT Comments    Continuing work on functional mobility and activity tolerance;  Session focused on progressive amb without UE support of IV pole, and to start stair trianing to begin to address/gently stress cardiopulm function; overall managing steps well, and walking household distances without difficulty; Anticipate he will meet current acute PT goals next session  Follow Up Recommendations  No PT follow up     Equipment Recommendations  None recommended by PT    Recommendations for Other Services       Precautions / Restrictions Precautions Precautions: Fall;Other (comment) Precaution Comments: J tube, JP drain, pain pump, CO2 monitoring, abdominal incision    Mobility  Bed Mobility Overal bed mobility: Independent                  Transfers Overall transfer level: Independent               General transfer comment: No difficulty  Ambulation/Gait Ambulation/Gait assistance: Supervision Gait Distance (Feet): 240 Feet Assistive device: None       General Gait Details: Overall managing gait quite well; noted occasional instances of decr foot clearance, mild scuffing with slight forward pitch of shoulders; pt recovering from those small pitches well, and indicated that he notices these small clearance issues in his day to day walking often   Stairs Stairs: Yes Stairs assistance: Min guard (without physical contact) Stair Management: One rail Left;Forwards;Alternating pattern Number of Stairs: 7 General stair comments: managed stairs  slowly; no toe catch on steps; mild dyspnea from which he recovered well   Wheelchair Mobility    Modified Rankin (Stroke Patients Only)       Balance                                            Cognition Arousal/Alertness: Awake/alert Behavior During Therapy: WFL for tasks assessed/performed Overall Cognitive Status: Within Functional Limits for tasks assessed                                        Exercises      General Comments General comments (skin integrity, edema, etc.): Walked well on Room air      Pertinent Vitals/Pain Pain Assessment: No/denies pain Pain Intervention(s): Monitored during session    Home Living                      Prior Function            PT Goals (current goals can now be found in the care plan section) Acute Rehab PT Goals Patient Stated Goal: Go home PT Goal Formulation: With patient Time For Goal Achievement: 10/01/20 Potential to Achieve Goals: Good Progress towards PT goals: Progressing toward goals    Frequency    Min 3X/week      PT Plan Current plan remains appropriate    Co-evaluation  AM-PAC PT "6 Clicks" Mobility   Outcome Measure  Help needed turning from your back to your side while in a flat bed without using bedrails?: None Help needed moving from lying on your back to sitting on the side of a flat bed without using bedrails?: None Help needed moving to and from a bed to a chair (including a wheelchair)?: None Help needed standing up from a chair using your arms (e.g., wheelchair or bedside chair)?: None Help needed to walk in hospital room?: None Help needed climbing 3-5 steps with a railing? : A Little 6 Click Score: 23    End of Session   Activity Tolerance: Patient tolerated treatment well Patient left: in chair;with call bell/phone within reach Nurse Communication: Mobility status PT Visit Diagnosis: Other abnormalities of gait and  mobility (R26.89);Pain Pain - Right/Left:  (abdomen) Pain - part of body:  (abdomen)     Time: 0938-1829 PT Time Calculation (min) (ACUTE ONLY): 15 min  Charges:  $Gait Training: 8-22 mins                     Roney Marion, PT  [place] Pager [phone] Office [place] 09/29/2020, 12:49 PM

## 2020-09-29 NOTE — Progress Notes (Signed)
9 Days Post-Op   Subjective/Chief Complaint: Episode of emesis overnight and tube feeds held, passing flatus, no complaints   Objective: Vital signs in last 24 hours: Temp:  [97.9 F (36.6 C)-98.5 F (36.9 C)] 98.4 F (36.9 C) (05/15 0359) Pulse Rate:  [73-87] 77 (05/15 0359) Resp:  [12-18] 17 (05/15 0359) BP: (109-127)/(83-86) 127/85 (05/15 0359) SpO2:  [93 %-95 %] 93 % (05/15 0359) Weight:  [77 kg] 77 kg (05/15 0359) Last BM Date: 09/27/20  Intake/Output from previous day: 05/14 0701 - 05/15 0700 In: 2099 [P.O.:720; NG/GT:1174] Out: 2785 [ZWCHE:5277; Emesis/NG output:800; Drains:60] Intake/Output this shift: No intake/output data recorded.  GI soft approp tender wound clean drain serous j tube in place   Lab Results:  Recent Labs    09/28/20 0356  WBC 14.7*  HGB 9.5*  HCT 26.4*  PLT 262   BMET Recent Labs    09/28/20 1043 09/29/20 0323  NA 130* 130*  K 4.0 4.1  CL 97* 94*  CO2 29 26  GLUCOSE 95 117*  BUN 23 25*  CREATININE 0.95 0.97  CALCIUM 8.1* 8.5*   PT/INR No results for input(s): LABPROT, INR in the last 72 hours. ABG No results for input(s): PHART, HCO3 in the last 72 hours.  Invalid input(s): PCO2, PO2  Studies/Results: DG Abd Portable 1V  Result Date: 09/28/2020 CLINICAL DATA:  Bilious emesis EXAM: PORTABLE ABDOMEN - 1 VIEW COMPARISON:  09/17/2020 FINDINGS: Percutaneous gastrojejunostomy tube is identified within the left hemiabdomen. There is a surgical drainage catheter overlying the right abdomen. Skin staples noted. The bowel gas pattern is normal. No radio-opaque calculi or other significant radiographic abnormality are seen. IMPRESSION: Nonobstructive bowel gas pattern. Electronically Signed   By: Kerby Moors M.D.   On: 09/28/2020 10:49    Anti-infectives: Anti-infectives (From admission, onward)   Start     Dose/Rate Route Frequency Ordered Stop   09/20/20 1100  metroNIDAZOLE (FLAGYL) IVPB 500 mg        500 mg 100 mL/hr over  60 Minutes Intravenous To Short Stay 09/20/20 1049 09/20/20 1355   09/20/20 0600  ceFAZolin (ANCEF) IVPB 2g/100 mL premix        2 g 200 mL/hr over 30 Minutes Intravenous On call to O.R. 09/19/20 0940 09/20/20 1324      Assessment/Plan: POD 9 s/p ex lap,GJ bypass J tubeplace, placement of surgical drain - Dr. Zenia Resides - 09/20/20 -OR findings:Unresectable locally advanced tumor in the gastric antrum, invading the head of the pancreas. No evidence of metastatic disease within the abdomen.  -Mobilize.PTrec no follow up - Onc following - Dr. Marin Olp- port to be placed, surgical team can do this prior to dc so I stopped IR order as we have been seeing him, if we cannot for some reason (which I doubt) then will have IR see - Cont surgical drain(was placed around pancreas)- currently serous, this should come out before dc -do nightly tube feeds again tonight, what is emesis doesn't sound like tube feeds, this is distal and would not stop for emesis again - oral pain meds, will try 24 hours reglan (discussed TD risk). FEN -J-tubeTF'sheld,CLD VTE -SCDs, hep subq ID -Ance/flagylf periop Rolm Bookbinder 09/29/2020

## 2020-09-29 NOTE — Progress Notes (Signed)
PROGRESS NOTE        PATIENT DETAILS Name: Alexander [name] Age: 65 y.o. Sex: male Date of Birth: June 17, 1955 Admit Date: 09/12/2020 Admitting Physician Lenore Cordia, MD [facility]:[id number], Anderson Malta, MD  Brief Narrative: Patient is a 65 y.o. male with history of COPD, HTN, HLD, GERD-who was found to have gastric outlet obstruction due to a newly diagnosed gastric malignancy-he was transferred to [facility] for further evaluation and treatment.  He underwent exploratory laparotomy-GJ bypass and G-tube placement-he unfortunately was found to have a unresectable tumor.  Significant events: 4/28>> transfer from [place] to [facility] for evaluation of gastric outlet obstruction due to malignancy.  Significant studies: None  Antimicrobial therapy: None  Microbiology data: None  Procedures : 5/6>> exploratory laparotomy-gastrojejunostomy-feeding jejunostomy.  Consults: General surgery, oncology  DVT Prophylaxis : heparin injection 5,000 Units Start: 09/24/20 2200 SCDs Start: 09/12/20 2127   Subjective: Lying comfortably in bed-denies any chest pain or shortness of breath-had another episode of emesis overnight.  Assessment/Plan: Gastric outlet obstruction due to gastric adenocarcinoma: Evaluated by general surgery-underwent laparotomy where he was found to have a unresectable gastric tumor-he subsequently underwent gastrojejunostomy and J-tube placement.  NG tube has been removed-no longer on TNA-General surgery following-has been switched to nocturnal feeding.  Had an episode of emesis overnight-per general surgery-this is not felt related to tube feeds-and felt probably to be distal in etiology-recommendations are and not to stop tube feeds in the event of emesis.  Remains on Reglan.  Hyponatremia: Mild-stable for monitoring without the need any for further work-up  HTN: Stable-continue clonidine patch and Nitropaste.    Upper GI bleeding:  This occurred at [facility]-this has resolved-on PPI.  Normocytic anemia: Likely due to acute illness.  Follow CBC periodically.  Thrombocytopenia: Resolved.  HIT antibody was negative.  Obesity: Estimated body mass index is 31.05 kg/m as calculated from the following:   Height as of this encounter: 5\' 2"  (1.575 m).   Weight as of this encounter: 77 kg.    Diet: Diet Order            Diet full liquid Room service appropriate? Yes; Fluid consistency: Thin  Diet effective now                  Code Status: Full code  Family Communication: Spouse at bedside on 5/11   Disposition Plan: Status is: Inpatient  Remains inpatient appropriate because:Inpatient level of care appropriate due to severity of illness   Dispo: The patient is from: Home              Anticipated d/c is to: Home              Patient currently is not medically stable to d/c.   Difficult to place patient No   Barriers to Discharge: Diet being advanced-not yet stable for discharge.  Antimicrobial agents: Anti-infectives (From admission, onward)   Start     Dose/Rate Route Frequency Ordered Stop   09/20/20 1100  metroNIDAZOLE (FLAGYL) IVPB 500 mg        500 mg 100 mL/hr over 60 Minutes Intravenous To Short Stay 09/20/20 1049 09/20/20 1355   09/20/20 0600  ceFAZolin (ANCEF) IVPB 2g/100 mL premix        2 g 200 mL/hr over 30 Minutes Intravenous On call to O.R. 09/19/20 0940 09/20/20 1324  Time spent: 25 minutes-Greater than 50% of this time was spent in counseling, explanation of diagnosis, planning of further management, and coordination of care.  MEDICATIONS: Scheduled Meds: . Chlorhexidine Gluconate Cloth  6 each Topical Daily  . cloNIDine  0.3 mg Transdermal Q Fri  . feeding supplement  1 Container Oral BID BM  . feeding supplement (OSMOLITE 1.5 CAL)  1,200 mL Per Tube Q24H  . feeding supplement (PROSource TF)  45 mL Per Tube TID  . free water  150 mL Per Tube Q4H  . heparin  injection (subcutaneous)  5,000 Units Subcutaneous Q8H  . metoCLOPramide (REGLAN) injection  5 mg Intravenous Q8H  . multivitamin with minerals  1 tablet Per Tube QHS  . nitroGLYCERIN  1 inch Topical Q6H  . pantoprazole sodium  40 mg Per Tube BID  . pneumococcal 23 valent vaccine  0.5 mL Intramuscular Tomorrow-1000   Continuous Infusions: . methocarbamol (ROBAXIN) IV 500 mg (09/27/20 0046)   PRN Meds:.acetaminophen **OR** acetaminophen, artificial tears, hydrALAZINE, labetalol, methocarbamol (ROBAXIN) IV, morphine injection, [DISCONTINUED] ondansetron **OR** ondansetron (ZOFRAN) IV, oxyCODONE, sodium chloride flush   PHYSICAL EXAM: Vital signs: Vitals:   09/28/20 0800 09/28/20 1143 09/28/20 2001 09/29/20 0359  BP:  109/83 116/86 127/85  Pulse:  73 87 77  Resp: 11 12 18 17   Temp:  97.9 F (36.6 C) 98.5 F (36.9 C) 98.4 F (36.9 C)  TempSrc:  Oral Oral Oral  SpO2: 93% 93% 95% 93%  Weight:    77 kg  Height:       Filed Weights   09/27/20 0340 09/28/20 0430 09/29/20 0359  Weight: 77.2 kg 76.8 kg 77 kg   Body mass index is 31.05 kg/m.   Gen Exam:Alert awake-not in any distress HEENT:atraumatic, normocephalic Chest: B/L clear to auscultation anteriorly CVS:S1S2 regular Abdomen:soft non tender, non distended Extremities:no edema Neurology: Non focal Skin: no rash  I have personally reviewed following labs and imaging studies  LABORATORY DATA: CBC: Recent Labs  Lab 09/23/20 0409 09/24/20 0406 09/28/20 0356  WBC 18.1* 14.2* 14.7*  NEUTROABS 13.1*  --   --   HGB 9.3* 9.6* 9.5*  HCT 27.4* 29.6* 26.4*  MCV 97.2 96.1 95.3  PLT 92* 214 579    Basic Metabolic Panel: Recent Labs  Lab 09/23/20 0409 09/24/20 0406 09/26/20 0418 09/28/20 0356 09/28/20 1043 09/29/20 0323  NA 142 143 138 129* 130* 130*  K 3.6 3.9 4.2 4.6 4.0 4.1  CL 107 108 102 95* 97* 94*  CO2 30 27 27 27 29 26   GLUCOSE 154* 143* 148* 111* 95 117*  BUN 24* 21 27* 25* 23 25*  CREATININE 0.85  0.82 0.94 0.89 0.95 0.97  CALCIUM 8.9 9.2 8.9 7.9* 8.1* 8.5*  MG 2.1  --  2.1  --   --   --   PHOS 2.9  --  4.9*  --   --   --     GFR: Estimated Creatinine Clearance: 67.4 mL/min (by C-G formula based on SCr of 0.97 mg/dL).  Liver Function Tests: Recent Labs  Lab 09/23/20 0409 09/26/20 0418  AST 24 31  ALT 34 48*  ALKPHOS 69 95  BILITOT 0.4 0.7  PROT 6.1* 5.8*  ALBUMIN 2.5* 2.4*   No results for input(s): LIPASE, AMYLASE in the last 168 hours. No results for input(s): AMMONIA in the last 168 hours.  Coagulation Profile: No results for input(s): INR, PROTIME in the last 168 hours.  Cardiac Enzymes: No results for input(s): CKTOTAL, CKMB,  CKMBINDEX, TROPONINI in the last 168 hours.  BNP (last 3 results) No results for input(s): PROBNP in the last 8760 hours.  Lipid Profile: No results for input(s): CHOL, HDL, LDLCALC, TRIG, CHOLHDL, LDLDIRECT in the last 72 hours.  Thyroid Function Tests: No results for input(s): TSH, T4TOTAL, FREET4, T3FREE, THYROIDAB in the last 72 hours.  Anemia Panel: Recent Labs    09/28/20 1043  TIBC 262  IRON 34*    Urine analysis: No results found for: COLORURINE, APPEARANCEUR, LABSPEC, PHURINE, GLUCOSEU, HGBUR, BILIRUBINUR, KETONESUR, PROTEINUR, UROBILINOGEN, NITRITE, LEUKOCYTESUR  Sepsis Labs: Lactic Acid, Venous No results found for: LATICACIDVEN  MICROBIOLOGY: No results found for this or any previous visit (from the past 240 hour(s)).  RADIOLOGY STUDIES/RESULTS: DG Abd Portable 1V  Result Date: 09/28/2020 CLINICAL DATA:  Bilious emesis EXAM: PORTABLE ABDOMEN - 1 VIEW COMPARISON:  09/17/2020 FINDINGS: Percutaneous gastrojejunostomy tube is identified within the left hemiabdomen. There is a surgical drainage catheter overlying the right abdomen. Skin staples noted. The bowel gas pattern is normal. No radio-opaque calculi or other significant radiographic abnormality are seen. IMPRESSION: Nonobstructive bowel gas pattern.  Electronically Signed   By: Kerby Moors M.D.   On: 09/28/2020 10:49     LOS: 17 days   Oren Binet, MD  [facility]    To contact the attending provider between 7A-7P or the covering provider during after hours 7P-7A, please log into the web site [email or web address] and access using universal [place] password for that web site. If you do not have the password, please call the hospital operator.  09/29/2020, 11:01 AM

## 2020-09-30 ENCOUNTER — Telehealth: Payer: Self-pay | Admitting: Gastroenterology

## 2020-09-30 MED ORDER — ONDANSETRON HCL 4 MG PO TABS
4.0000 mg | ORAL_TABLET | Freq: Four times a day (QID) | ORAL | Status: DC | PRN
  Administered 2020-10-01: 4 mg via ORAL
  Filled 2020-09-30: qty 1

## 2020-09-30 MED ORDER — METHOCARBAMOL 500 MG PO TABS: 500.0000 mg | ORAL_TABLET | Freq: Four times a day (QID) | ORAL | Status: DC | PRN

## 2020-09-30 MED ORDER — OXYCODONE HCL 5 MG PO TABS
5.0000 mg | ORAL_TABLET | ORAL | Status: DC | PRN
  Administered 2020-09-30 – 2020-10-01 (×2): 5 mg via ORAL
  Filled 2020-09-30 (×2): qty 1

## 2020-09-30 MED ORDER — SODIUM CHLORIDE 0.9 % IV SOLN
12.5000 mg | Freq: Four times a day (QID) | INTRAVENOUS | Status: DC | PRN
  Filled 2020-09-30: qty 0.5

## 2020-09-30 MED ORDER — MORPHINE SULFATE (PF) 2 MG/ML IV SOLN: 1.0000 mg | INTRAVENOUS | Status: DC | PRN

## 2020-09-30 NOTE — Consult Note (Signed)
Chief Complaint: Patient was seen in consultation today for [facility] a cath placemnt at the request of Dr Marin Olp   Supervising Physician: Markus Daft  Patient Status: [facility] - In-pt  History of Present Illness: Alexander [name] is a 65 y.o. male   Gastric cancer GOO from a gastric cancer at the antrum S/p ex lap,GJ bypass J tubeplace, placement of surgical drain - Dr. Zenia Resides - 09/20/20 ---Unresectable locally advanced tumor in the gastric antrum, invading the head of the pancreas. No evidence of metastatic disease within the abdomen- ---Onc following - Dr. Marin Olp. He wants port to be placed prior to discharge, discussed with our MD on call this week and Dr. Marin Olp can proceed with asking IR to place.  IR request for [facility] asap--- before DC Plan for DC probable this week  Scheduled tentatively now for [facility] placement 5/17    Past Medical History:  Diagnosis Date  . COPD (chronic obstructive pulmonary disease) ([place])   . GERD without esophagitis 01/04/2020   [facility]    Past Surgical History:  Procedure Laterality Date  . APPENDECTOMY    . GASTROJEJUNOSTOMY  09/20/2020   Procedure: GASTROJEJUNOSTOMY;  Surgeon: Dwan Bolt, MD;  Location: [place];  Service: General;;  . HERNIA REPAIR     age 5 or 2   . JEJUNOSTOMY N/A 09/20/2020   Procedure: FEEDING JEJUNOSTOMY;  Surgeon: Dwan Bolt, MD;  Location: [place];  Service: General;  Laterality: N/A;    Allergies: Patient has no known allergies.  Medications: Prior to Admission medications   Medication Sig Start Date End Date Taking? Authorizing Provider  ibuprofen (ADVIL) 200 MG tablet Take 400 mg by mouth every 6 (six) hours as needed for mild pain.   Yes [provider]  losartan (COZAAR) 50 MG tablet Take 50 mg by mouth every morning. 06/25/20  Yes [provider]  pantoprazole (PROTONIX) 40 MG tablet Take 40 mg by mouth at bedtime. 06/25/20  Yes [provider]  rosuvastatin  (CRESTOR) 10 MG tablet Take 10 mg by mouth every morning. 06/25/20  Yes [provider]  Sod Picosulfate-Mag Ox-Cit Acd (CLENPIQ) 10-3.5-12 MG-GM -GM/160ML SOLN Take 1 kit by mouth as directed. Patient not taking: No sig reported 08/20/20   Cirigliano, Dominic Pea, DO     Family History  Problem Relation Age of Onset  . Diabetes Sister   . Hypertension Brother   . Brain cancer Father   . Cancer Neg Hx   . Stroke Neg Hx   . Heart attack Neg Hx     Social History   Socioeconomic History  . Marital status: Married    Spouse name: Not on file  . Number of children: Not on file  . Years of education: Not on file  . Highest education level: Not on file  Occupational History  . Not on file  Tobacco Use  . Smoking status: Former Smoker    Types: Cigars    Quit date: 04/2019    Years since quitting: 1.4  . Smokeless tobacco: Never Used  . Tobacco comment: 35-40 pack years  Vaping Use  . Vaping Use: Never used  Substance and Sexual Activity  . Alcohol use: Not Currently    Comment: doesnt drink anymore. stopped in 2021.  Previously drinking 6 pack of beer on almost daily basis.  . Drug use: Not Currently  . Sexual activity: Not on file  Other Topics Concern  . Not on file  Social History Narrative  . Not on file   Social Determinants of Health   Financial Resource Strain: Not on file  Food Insecurity: Not on file  Transportation Needs: Not on file  Physical Activity: Not on file  Stress: Not on file  Social Connections: Not on file    Review of Systems: A 12 point ROS discussed and pertinent positives are indicated in the HPI above.  All other systems are negative.  Review of Systems  Constitutional: Positive for activity change, appetite change and unexpected weight change.  Respiratory: Negative for cough and shortness of breath.   Cardiovascular: Negative for chest pain.  Gastrointestinal: Positive for abdominal pain and nausea.  Psychiatric/Behavioral: Negative  for behavioral problems and confusion.    Vital Signs: BP 127/68 (BP Location: Left Arm)   Pulse 80   Temp 98 F (36.7 C) (Oral)   Resp 18   Ht _0  (1.575 m)   Wt 169 lb 15.6 oz (77.1 kg)   SpO2 95%   BMI 31.09 kg/m   Physical Exam Vitals reviewed.  HENT:     Mouth/Throat:     Mouth: Mucous membranes are moist.  Cardiovascular:     Rate and Rhythm: Normal rate and regular rhythm.     Heart sounds: Normal heart sounds.  Pulmonary:     Effort: Pulmonary effort is normal.     Breath sounds: Normal breath sounds.  Abdominal:     Tenderness: There is abdominal tenderness.  Musculoskeletal:        General: Normal range of motion.  Skin:    General: Skin is warm.  Neurological:     Mental Status: He is alert and oriented to person, place, and time.  Psychiatric:        Behavior: Behavior normal.     Imaging: DG Abd 1 View  Result Date: 09/17/2020 CLINICAL DATA:  Status post nasogastric tube placement EXAM: ABDOMEN - 1 VIEW COMPARISON:  September 12, 2020 FINDINGS: Nasogastric tube with tip and side port overlying the stomach. The bowel gas pattern is normal. No radio-opaque calculi or other significant radiographic abnormality are seen. IMPRESSION: Nasogastric tube with tip and side port overlying the stomach. Electronically Signed   By: Dahlia Bailiff MD   On: 09/17/2020 13:07   DG Chest Port 1 View  Result Date: 09/13/2020 CLINICAL DATA:  Cough and shortness of breath EXAM: PORTABLE CHEST 1 VIEW COMPARISON:  Chest CT September 05, 2020 FINDINGS: Nasogastric tube tip and side port in stomach. No edema or airspace opacity. Heart size and pulmonary vascularity are normal. No adenopathy. No bone lesions. IMPRESSION: Nasogastric tube tip and side port in stomach. Lungs clear. Cardiac silhouette within normal limits. Electronically Signed   By: Lowella Grip [name] M.D.   On: 09/13/2020 08:00   DG Abd Portable 1V  Result Date: 09/28/2020 CLINICAL DATA:  Bilious emesis EXAM: PORTABLE  ABDOMEN - 1 VIEW COMPARISON:  09/17/2020 FINDINGS: Percutaneous gastrojejunostomy tube is identified within the left hemiabdomen. There is a surgical drainage catheter overlying the right abdomen. Skin staples noted. The bowel gas pattern is normal. No radio-opaque calculi or other significant radiographic abnormality are seen. IMPRESSION: Nonobstructive bowel gas pattern. Electronically Signed   By: Kerby Moors M.D.   On: 09/28/2020 10:49   DG Abd Portable 1V  Result Date: 09/12/2020 CLINICAL DATA:  NG tube in place.  Gastric outlet obstruction. EXAM: PORTABLE ABDOMEN - 1 VIEW COMPARISON:  Abdominal CTA 09/05/2020 FINDINGS: Tip and side port of the  enteric tube are below the diaphragm in the stomach, enteric tube is coiled within the gastric body. Decreased gastric distension from prior CT. Generalized paucity of small bowel gas. Residual high-density contrast throughout the colon. Vascular calcifications are seen. IMPRESSION: Tip and side port of the enteric tube below the diaphragm in the stomach, enteric tube coiled within the gastric body. Electronically Signed   By: Keith Rake M.D.   On: 09/12/2020 23:28   [place] EKG SITE RITE  Result Date: 09/13/2020 If Site Rite image not attached, placement could not be confirmed due to current cardiac rhythm.   Labs:  CBC: Recent Labs    09/22/20 0458 09/23/20 0409 09/24/20 0406 09/28/20 0356  WBC 21.8* 18.1* 14.2* 14.7*  HGB 9.1* 9.3* 9.6* 9.5*  HCT 25.8* 27.4* 29.6* 26.4*  PLT 207 92* 214 262    COAGS: Recent Labs    09/13/20 0638  INR 1.0    BMP: Recent Labs    09/26/20 0418 09/28/20 0356 09/28/20 1043 09/29/20 0323  NA 138 129* 130* 130*  K 4.2 4.6 4.0 4.1  CL 102 95* 97* 94*  CO2 _0 GLUCOSE 148* 111* 95 117*  BUN 27* 25* 23 25*  CALCIUM 8.9 7.9* 8.1* 8.5*  CREATININE 0.94 0.89 0.95 0.97  GFRNONAA >60 >60 >60 >60    LIVER FUNCTION TESTS: Recent Labs    09/21/20 1002 09/22/20 0458 09/23/20 0409  09/26/20 0418  BILITOT 0.6 0.4 0.4 0.7  AST 36 _1 ALT 55* 43 34 48*  ALKPHOS 67 67 69 95  PROT 5.9* 5.9* 6.1* 5.8*  ALBUMIN 2.7* 2.7* 2.5* 2.4*    TUMOR MARKERS: No results for input(s): AFPTM, CEA, CA199, CHROMGRNA in the last 8760 hours.  Assessment and Plan:  Newly diagnosed gastric cancer Gastric oulet obstruction Unresectable tumor Dr Marin Olp asking for [facility] a cath placement before discharge end of week Risks and benefits of image guided port-a-catheter placement was discussed with the patient including, but not limited to bleeding, infection, pneumothorax, or fibrin sheath development and need for additional procedures.  All of the patient's questions were answered, patient is agreeable to proceed. Consent signed and in chart.    Thank you for this interesting consult.  I greatly enjoyed meeting Alexander [name] and look forward to participating in their care.  A copy of this report was sent to the requesting provider on this date.  Electronically Signed: Lavonia Drafts, PA-C 09/30/2020, 12:28 PM   I spent a total of 40 Minutes    in face to face in clinical consultation, greater than 50% of which was counseling/coordinating care for [facility] placement

## 2020-09-30 NOTE — Progress Notes (Signed)
Physical Therapy Treatment Patient Details Name: Alexander [name] MRN: [id number] DOB: [birth year] Today's Date: 09/30/2020    History of Present Illness 65yo male admitted 09/12/20 via transfer from [facility] secondary to gastic malignancy with gastric outlet obstruction. Received EGD and NGT 09/06/20. Pt underwent jejunostomy and gastrojejunostomy on 5/6 with J tube placement, unable to resect tumor. PMH COPD.    PT Comments    Patient received in bed, very pleasant and cooperative. Now independent with all aspects of mobility including stair navigation. Discussed and practiced log rolling/bed mobility in a way that will relieve stress from his abdominal incision, and he was able to do so easily. Left in bed with all needs met. Now mobilizing at a completely independent level and OK to walk the 5W unit on an independent basis. PT signing off, thank you for the referral!     Follow Up Recommendations  No PT follow up     Equipment Recommendations  None recommended by PT    Recommendations for Other Services       Precautions / Restrictions Precautions Precautions: Fall;Other (comment) Precaution Comments: J tube, JP drain, abdominal incision Restrictions Weight Bearing Restrictions: No    Mobility  Bed Mobility Overal bed mobility: Independent             General bed mobility comments: able to perform log rolling and side <-> sit with distant S without difficulty    Transfers Overall transfer level: Independent Equipment used: None             General transfer comment: No difficulty  Ambulation/Gait Ambulation/Gait assistance: Independent Gait Distance (Feet): 260 Feet Assistive device: None Gait Pattern/deviations: WFL(Within Functional Limits);Step-through pattern Gait velocity: decreased   General Gait Details: gait WNL, mild deviations but nothing concerning or causing LOB   Stairs             Wheelchair Mobility    Modified  Rankin (Stroke Patients Only)       Balance Overall balance assessment: Independent                                          Cognition Arousal/Alertness: Awake/alert Behavior During Therapy: WFL for tasks assessed/performed Overall Cognitive Status: Within Functional Limits for tasks assessed                                        Exercises      General Comments        Pertinent Vitals/Pain Pain Assessment: Faces Faces Pain Scale: Hurts a little bit Pain Location: abdominal area Pain Descriptors / Indicators: Aching;Sore Pain Intervention(s): Limited activity within patient's tolerance;Monitored during session    Home Living                      Prior Function            PT Goals (current goals can now be found in the care plan section) Acute Rehab PT Goals Patient Stated Goal: Go home PT Goal Formulation: With patient Time For Goal Achievement: 10/01/20 Potential to Achieve Goals: Good Progress towards PT goals: Goals met/education completed, patient discharged from PT    Frequency           PT Plan Current plan remains appropriate  Co-evaluation              AM-PAC PT "6 Clicks" Mobility   Outcome Measure  Help needed turning from your back to your side while in a flat bed without using bedrails?: None Help needed moving from lying on your back to sitting on the side of a flat bed without using bedrails?: None Help needed moving to and from a bed to a chair (including a wheelchair)?: None Help needed standing up from a chair using your arms (e.g., wheelchair or bedside chair)?: None Help needed to walk in hospital room?: None Help needed climbing 3-5 steps with a railing? : None 6 Click Score: 24    End of Session Equipment Utilized During Treatment: Gait belt Activity Tolerance: Patient tolerated treatment well Patient left: in bed;with call bell/phone within reach Nurse Communication: Mobility  status PT Visit Diagnosis: Other abnormalities of gait and mobility (R26.89);Pain Pain - Right/Left:  (abdomen) Pain - part of body:  (midline abdomen)     Time: 1420-1430 PT Time Calculation (min) (ACUTE ONLY): 10 min  Charges:  $Gait Training: 8-22 mins                     Windell Norfolk, DPT, PN1   Supplemental Physical Therapist [place]    Pager [phone] Acute Rehab Office [phone]

## 2020-09-30 NOTE — TOC Initial Note (Signed)
Transition of Care [facility]) - Initial/Assessment Note    Patient Details  Name: Alexander [name] MRN: [id number] Date of Birth: Sep 04, 1955  Transition of Care [facility]) CM/SW Contact:    Verdell Carmine, RN Phone Number: 09/30/2020, 3:10 PM  Clinical Narrative:                 65 year old transfer to [place] gastri resection for mass. Of gastric outlet.  Mass was found not be operable.  G-J tube placed Oncology and surgery are on the case. Patient transferred from [place]. PT evaluated patient, currently no recommendations for [facility]. He may need TF and education via [employer]. , He is on a full liquid diet but still having some emesis. Pam from [place] given the heads up about this patient so she can make contact with providers. .   Expected Discharge Plan: Home/Self Care Barriers to Discharge: Continued Medical Work up   Patient Goals and CMS Choice        Expected Discharge Plan and Services Expected Discharge Plan: Home/Self Care       Living arrangements for the past 2 months: Single Family Home                                      Prior Living Arrangements/Services Living arrangements for the past 2 months: Single Family Home Lives with:: Spouse Patient language and need for interpreter reviewed:: Yes        Need for Family Participation in Patient Care: Yes (Comment) Care giver support system in place?: Yes (comment)   Criminal Activity/Legal Involvement Pertinent to Current Situation/Hospitalization: No - Comment as needed  Activities of Daily Living Home Assistive Devices/Equipment: Eyeglasses ADL Screening (condition at time of admission) Patient's cognitive ability adequate to safely complete daily activities?: Yes Is the patient deaf or have difficulty hearing?: No Does the patient have difficulty seeing, even when wearing glasses/contacts?: No Does the patient have difficulty concentrating, remembering, or making decisions?: No Patient able to  express need for assistance with ADLs?: Yes Does the patient have difficulty dressing or bathing?: No Independently performs ADLs?: Yes (appropriate for developmental age) Does the patient have difficulty walking or climbing stairs?: No Weakness of Legs: None Weakness of Arms/Hands: None  Permission Sought/Granted                  Emotional Assessment       Orientation: : Oriented to Self,Oriented to Place,Oriented to  Time,Oriented to Situation Alcohol / Substance Use: Not Applicable Psych Involvement: No (comment)  Admission diagnosis:  Gastric outlet obstruction [K31.1] Patient Active Problem List   Diagnosis Date Noted  . Gastric outlet obstruction 09/12/2020  . Gastric adenocarcinoma ([place]) 09/12/2020  . Essential hypertension 09/12/2020   PCP:  Serita Grammes, MD Pharmacy:   [facility], [place] [address] [place] [address] Phone: [phone] Fax: [phone]     Social Determinants of Health (SDOH) Interventions    Readmission Risk Interventions No flowsheet data found.

## 2020-09-30 NOTE — Progress Notes (Signed)
[facility] Progress Note  10 Days Post-Op  Subjective: CC-  Up in chair. Abdominal pain well controlled. He has not taken any narcotic in 2 days. He did vomit again this morning. Denies bloating. He is passing flatus and had a couple loose/soft BMs yesterday.   Objective: Vital signs in last 24 hours: Temp:  [98 F (36.7 C)-98.3 F (36.8 C)] 98 F (36.7 C) (05/16 0430) Pulse Rate:  [73-80] 80 (05/16 0430) Resp:  [18-19] 18 (05/16 0430) BP: (110-133)/(68-91) 127/68 (05/16 0430) SpO2:  [93 %-98 %] 95 % (05/16 0430) Weight:  [77.1 kg] 77.1 kg (05/16 0140) Last BM Date: 09/30/20  Intake/Output from previous day: 05/15 0701 - 05/16 0700 In: 2553.8 [P.O.:480; NG/GT:1848.8] Out: 320 [Urine:300; Drains:20] Intake/Output this shift: Total I/O In: -  Out: 1000 [Emesis/NG output:1000]  PE: Gen: Alert, NAD, pleasant Pulm:rate and effort normal Abd: Soft, nondistended,+BS, nontender. Midlineincision with staples present and no erythema or drainage. Drain serous fluid in bulb. J-tube clamped  Lab Results:  Recent Labs    09/28/20 0356  WBC 14.7*  HGB 9.5*  HCT 26.4*  PLT 262   BMET Recent Labs    09/28/20 1043 09/29/20 0323  NA 130* 130*  K 4.0 4.1  CL 97* 94*  CO2 29 26  GLUCOSE 95 117*  BUN 23 25*  CREATININE 0.95 0.97  CALCIUM 8.1* 8.5*   PT/INR No results for input(s): LABPROT, INR in the last 72 hours. CMP     Component Value Date/Time   NA 130 (L) 09/29/2020 0323   K 4.1 09/29/2020 0323   CL 94 (L) 09/29/2020 0323   CO2 26 09/29/2020 0323   GLUCOSE 117 (H) 09/29/2020 0323   BUN 25 (H) 09/29/2020 0323   CREATININE 0.97 09/29/2020 0323   CALCIUM 8.5 (L) 09/29/2020 0323   PROT 5.8 (L) 09/26/2020 0418   ALBUMIN 2.4 (L) 09/26/2020 0418   AST 31 09/26/2020 0418   ALT 48 (H) 09/26/2020 0418   ALKPHOS 95 09/26/2020 0418   BILITOT 0.7 09/26/2020 0418   GFRNONAA >60 09/29/2020 0323   Lipase  No results found for:  LIPASE     Studies/Results: No results found.  Anti-infectives: Anti-infectives (From admission, onward)   Start     Dose/Rate Route Frequency Ordered Stop   09/20/20 1100  metroNIDAZOLE (FLAGYL) IVPB 500 mg        500 mg 100 mL/hr over 60 Minutes Intravenous To Short Stay 09/20/20 1049 09/20/20 1355   09/20/20 0600  ceFAZolin (ANCEF) IVPB 2g/100 mL premix        2 g 200 mL/hr over 30 Minutes Intravenous On call to O.R. 09/19/20 0940 09/20/20 1324       Assessment/Plan GOO from a gastric cancer at the antrum S/p ex lap,GJ bypass J tubeplace, placement of surgical drain - Dr. Zenia Resides - 09/20/20 - POD #10 -OR findings:Unresectable locally advanced tumor in the gastric antrum, invading the head of the pancreas. No evidence of metastatic disease within the abdomen.  -Mobilize.PTrec no follow up - Onc following - Dr. Marin Olp. He wants port to be placed prior to discharge, discussed with our MD on call this week and Dr. Marin Olp can proceed with asking IR to place if he wants this - Cont surgical drain(was placed around pancreas) - currently serous, plan to d/c prior to discharge - transitioning to nocturnal tube feedings, he will need to continue these at home - continue liquid diet as tolerated, antinausea medications PRN  FEN -nocturnal  J-tubeTF's, FLD VTE -SCDs, hep subq ID -Ance/flagylf periop Foley -out, voiding   LOS: 18 days    Wellington Hampshire, [facility] 09/30/2020, 9:35 AM Please see Amion for pager number during day hours 7:00am-4:30pm

## 2020-09-30 NOTE — Telephone Encounter (Signed)
Dr. Cirigliano, please see note below. Thanks 

## 2020-09-30 NOTE — Progress Notes (Signed)
PROGRESS NOTE        PATIENT DETAILS Name: Alexander [name] Age: 65 y.o. Sex: male Date of Birth: 06/08/1955 Admit Date: 09/12/2020 Admitting Physician Lenore Cordia, MD ESP:[id number], Anderson Malta, MD  Brief Narrative: Patient is a 65 y.o. male with history of COPD, HTN, HLD, GERD-who was found to have gastric outlet obstruction due to a newly diagnosed gastric malignancy-he was transferred to [facility] for further evaluation and treatment.  He underwent exploratory laparotomy-GJ bypass and G-tube placement-he unfortunately was found to have a unresectable tumor.  Significant events: 4/28>> transfer from [place] to [facility] for evaluation of gastric outlet obstruction due to malignancy.  Significant studies: None  Antimicrobial therapy: None  Microbiology data: None  Procedures : 5/6>> exploratory laparotomy-gastrojejunostomy-feeding jejunostomy.  Consults: General surgery, oncology  DVT Prophylaxis : heparin injection 5,000 Units Start: 09/24/20 2200 SCDs Start: 09/12/20 2127   Subjective: Did not vomit last night but vomited this morning.  Having BM/passing flatus.  Assessment/Plan: Gastric outlet obstruction due to gastric adenocarcinoma: Evaluated by general surgery-underwent laparotomy where he was found to have a unresectable gastric tumor-he subsequently underwent gastrojejunostomy and J-tube placement.  NG tube has been removed-no longer on TNA-General surgery following-has been switched to nocturnal feeding.  Continues to have intermittent episodes of emesis-however passing gas/having BMs.  Continue supportive care-General surgery following.   Hyponatremia: Mild-stable for monitoring without the need any for further work-up  HTN: Stable-continue clonidine patch and Nitropaste.    Upper GI bleeding: This occurred at [facility]-this has resolved-on PPI.  Normocytic anemia: Likely due to acute illness.  Follow CBC  periodically.  Thrombocytopenia: Resolved.  HIT antibody was negative.  Obesity: Estimated body mass index is 31.09 kg/m as calculated from the following:   Height as of this encounter: 5\' 2"  (1.575 m).   Weight as of this encounter: 77.1 kg.    Diet: Diet Order            Diet full liquid Room service appropriate? Yes; Fluid consistency: Thin  Diet effective now                  Code Status: Full code  Family Communication: Spouse at bedside on 5/11   Disposition Plan: Status is: Inpatient  Remains inpatient appropriate because:Inpatient level of care appropriate due to severity of illness   Dispo: The patient is from: Home              Anticipated d/c is to: Home              Patient currently is not medically stable to d/c.   Difficult to place patient No   Barriers to Discharge: Diet being advanced-still vomiting-not yet stable for discharge.  Antimicrobial agents: Anti-infectives (From admission, onward)   Start     Dose/Rate Route Frequency Ordered Stop   09/20/20 1100  metroNIDAZOLE (FLAGYL) IVPB 500 mg        500 mg 100 mL/hr over 60 Minutes Intravenous To Short Stay 09/20/20 1049 09/20/20 1355   09/20/20 0600  ceFAZolin (ANCEF) IVPB 2g/100 mL premix        2 g 200 mL/hr over 30 Minutes Intravenous On call to O.R. 09/19/20 0940 09/20/20 1324       Time spent: 25 minutes-Greater than 50% of this time was spent in counseling, explanation of diagnosis, planning  of further management, and coordination of care.  MEDICATIONS: Scheduled Meds: . Chlorhexidine Gluconate Cloth  6 each Topical Daily  . cloNIDine  0.3 mg Transdermal Q Fri  . feeding supplement  1 Container Oral BID BM  . feeding supplement (OSMOLITE 1.5 CAL)  1,200 mL Per Tube Q24H  . feeding supplement (PROSource TF)  45 mL Per Tube TID  . free water  150 mL Per Tube Q4H  . heparin injection (subcutaneous)  5,000 Units Subcutaneous Q8H  . multivitamin with minerals  1 tablet Per Tube  QHS  . nitroGLYCERIN  1 inch Topical Q6H  . pantoprazole sodium  40 mg Per Tube BID  . pneumococcal 23 valent vaccine  0.5 mL Intramuscular Tomorrow-1000   Continuous Infusions: . promethazine (PHENERGAN) injection (IM or IVPB)     PRN Meds:.acetaminophen **OR** acetaminophen, artificial tears, hydrALAZINE, labetalol, methocarbamol, morphine injection, [DISCONTINUED] ondansetron **OR** ondansetron (ZOFRAN) IV, ondansetron, oxyCODONE, promethazine (PHENERGAN) injection (IM or IVPB), sodium chloride flush   PHYSICAL EXAM: Vital signs: Vitals:   09/29/20 1152 09/29/20 2039 09/30/20 0140 09/30/20 0430  BP: 110/82 (!) 133/91  127/68  Pulse: 73 79  80  Resp: 18 19  18   Temp: 98.3 F (36.8 C) 98.2 F (36.8 C)  98 F (36.7 C)  TempSrc: Oral Oral  Oral  SpO2: 93% 98%  95%  Weight:   77.1 kg   Height:       Filed Weights   09/28/20 0430 09/29/20 0359 09/30/20 0140  Weight: 76.8 kg 77 kg 77.1 kg   Body mass index is 31.09 kg/m.   Gen Exam:Alert awake-not in any distress HEENT:atraumatic, normocephalic Chest: B/L clear to auscultation anteriorly CVS:S1S2 regular Abdomen:soft non tender, non distended Extremities:no edema Neurology: Non focal Skin: no rash I have personally reviewed following labs and imaging studies  LABORATORY DATA: CBC: Recent Labs  Lab 09/24/20 0406 09/28/20 0356  WBC 14.2* 14.7*  HGB 9.6* 9.5*  HCT 29.6* 26.4*  MCV 96.1 95.3  PLT 214 629    Basic Metabolic Panel: Recent Labs  Lab 09/24/20 0406 09/26/20 0418 09/28/20 0356 09/28/20 1043 09/29/20 0323  NA 143 138 129* 130* 130*  K 3.9 4.2 4.6 4.0 4.1  CL 108 102 95* 97* 94*  CO2 27 27 27 29 26   GLUCOSE 143* 148* 111* 95 117*  BUN 21 27* 25* 23 25*  CREATININE 0.82 0.94 0.89 0.95 0.97  CALCIUM 9.2 8.9 7.9* 8.1* 8.5*  MG  --  2.1  --   --   --   PHOS  --  4.9*  --   --   --     GFR: Estimated Creatinine Clearance: 67.4 mL/min (by C-G formula based on SCr of 0.97 mg/dL).  Liver  Function Tests: Recent Labs  Lab 09/26/20 0418  AST 31  ALT 48*  ALKPHOS 95  BILITOT 0.7  PROT 5.8*  ALBUMIN 2.4*   No results for input(s): LIPASE, AMYLASE in the last 168 hours. No results for input(s): AMMONIA in the last 168 hours.  Coagulation Profile: No results for input(s): INR, PROTIME in the last 168 hours.  Cardiac Enzymes: No results for input(s): CKTOTAL, CKMB, CKMBINDEX, TROPONINI in the last 168 hours.  BNP (last 3 results) No results for input(s): PROBNP in the last 8760 hours.  Lipid Profile: No results for input(s): CHOL, HDL, LDLCALC, TRIG, CHOLHDL, LDLDIRECT in the last 72 hours.  Thyroid Function Tests: No results for input(s): TSH, T4TOTAL, FREET4, T3FREE, THYROIDAB in the last 72  hours.  Anemia Panel: Recent Labs    09/28/20 1043  TIBC 262  IRON 34*    Urine analysis: No results found for: COLORURINE, APPEARANCEUR, LABSPEC, PHURINE, GLUCOSEU, HGBUR, BILIRUBINUR, KETONESUR, PROTEINUR, UROBILINOGEN, NITRITE, LEUKOCYTESUR  Sepsis Labs: Lactic Acid, Venous No results found for: LATICACIDVEN  MICROBIOLOGY: No results found for this or any previous visit (from the past 240 hour(s)).  RADIOLOGY STUDIES/RESULTS: No results found.   LOS: 18 days   Oren Binet, MD  [facility]    To contact the attending provider between 7A-7P or the covering provider during after hours 7P-7A, please log into the web site [email or web address] and access using universal [place] password for that web site. If you do not have the password, please call the hospital operator.  09/30/2020, 11:30 AM

## 2020-09-30 NOTE — Telephone Encounter (Signed)
Pt's wife Alexander [name] called to inform Dr. Loletha Grayer that pt has been hospitalized and had procedures performed already that revealed stomach cancer, he will begin chemo therapy soon.

## 2020-10-01 ENCOUNTER — Inpatient Hospital Stay (HOSPITAL_COMMUNITY): Payer: PPO

## 2020-10-01 HISTORY — PX: IR IMAGING GUIDED PORT INSERTION: IMG5740

## 2020-10-01 LAB — BASIC METABOLIC PANEL
Anion gap: 9 (ref 5–15)
BUN: 26 mg/dL — ABNORMAL HIGH (ref 8–23)
CO2: 28 mmol/L (ref 22–32)
Calcium: 8.5 mg/dL — ABNORMAL LOW (ref 8.9–10.3)
Chloride: 94 mmol/L — ABNORMAL LOW (ref 98–111)
Creatinine, Ser: 0.99 mg/dL (ref 0.61–1.24)
GFR, Estimated: >60 mL/min (ref >60)
Glucose, Bld: 109 mg/dL — ABNORMAL HIGH (ref 70–99)
Potassium: 4.1 mmol/L (ref 3.5–5.1)
Sodium: 131 mmol/L — ABNORMAL LOW (ref 135–145)

## 2020-10-01 MED ORDER — ENSURE ENLIVE PO LIQD: 237.0000 mL | Freq: Two times a day (BID) | ORAL | Status: DC

## 2020-10-01 MED ORDER — MIDAZOLAM HCL 2 MG/2ML IJ SOLN
INTRAMUSCULAR | Status: AC | PRN
  Administered 2020-10-01: 1 mg via INTRAVENOUS

## 2020-10-01 MED ORDER — HEPARIN SOD (PORK) LOCK FLUSH 100 UNIT/ML IV SOLN
INTRAVENOUS | Status: AC | PRN
  Administered 2020-10-01: 500 [IU] via INTRAVENOUS

## 2020-10-01 MED ORDER — FENTANYL CITRATE (PF) 100 MCG/2ML IJ SOLN
INTRAMUSCULAR | Status: AC | PRN
  Administered 2020-10-01: 50 ug via INTRAVENOUS

## 2020-10-01 MED ORDER — LIDOCAINE-EPINEPHRINE 1 %-1:100000 IJ SOLN
INTRAMUSCULAR | Status: AC | PRN
  Administered 2020-10-01: 20 mL

## 2020-10-01 MED ORDER — SODIUM CHLORIDE 0.9 % IV SOLN
INTRAVENOUS | Status: AC | PRN
  Administered 2020-10-01: 10 mL/h via INTRAVENOUS

## 2020-10-01 MED ORDER — MIDAZOLAM HCL 2 MG/2ML IJ SOLN
INTRAMUSCULAR | Status: AC
  Filled 2020-10-01: qty 2

## 2020-10-01 MED ORDER — FENTANYL CITRATE (PF) 100 MCG/2ML IJ SOLN
INTRAMUSCULAR | Status: AC
  Filled 2020-10-01: qty 2

## 2020-10-01 MED ORDER — HEPARIN SOD (PORK) LOCK FLUSH 100 UNIT/ML IV SOLN
INTRAVENOUS | Status: AC
  Filled 2020-10-01: qty 5

## 2020-10-01 MED ORDER — LIDOCAINE-EPINEPHRINE 1 %-1:100000 IJ SOLN
INTRAMUSCULAR | Status: AC
  Filled 2020-10-01: qty 1

## 2020-10-01 NOTE — Telephone Encounter (Signed)
Notes reviewed of his current hospitalization.  Very sorry to hear about his diagnosis but good to see he is getting expedited inpatient care.  Thank you for the update.

## 2020-10-01 NOTE — Telephone Encounter (Signed)
Left detailed message on wife's vm with the information. Advised that if she needed anything from [place] to give [place] a call.

## 2020-10-01 NOTE — TOC Initial Note (Signed)
Transition of Care [facility]) - Initial/Assessment Note    Patient Details  Name: Alexander [name] MRN: [id number] Date of Birth: 11-Jan-1956  Transition of Care [facility]) CM/SW Contact:    Carles Collet, RN Phone Number: 10/01/2020, 5:16 PM  Clinical Narrative:              Patient expected to DC in next 24 hours. Will need home tube feeds, formula, tubing, and pump. Referral accepted by Dellie Burns who will provide these for home. Pam will be in tomorrow to teach patient and spouse how to use. Will need [facility] nurse- referral declined by [facility], [facility], [place]. LVM w [facility]. Referral sent to [facility], who will check with [place] office and get back to me by tomorrow.  Spoke w patient. He states that he lives at home w his wife who was a CNA for 35 years. She is at home and can provide 24 hour support. He is going to reach out to her to coordinate her being here tomorrow to get teaching on feeds. He confirmed that he will not need assistance with transportation.      Expected Discharge Plan: [place] Barriers to Discharge:  (home health provider and education on tube feeds)   Patient Goals and CMS Choice Patient states their goals for this hospitalization and ongoing recovery are:: to go home CMS Medicare.gov Compare Post Acute Care list provided to:: Patient Choice offered to / list presented to : Patient  Expected Discharge Plan and Services Expected Discharge Plan: [place]   Discharge Planning Services: CM Consult Post Acute Care Choice: [place] Living arrangements for the past 2 months: Single Family Home                 DME Arranged: Tube feeding pump,Tube feeding DME Agency:  Ysidro Evert) Date DME Agency Contacted: 10/01/20 Time DME Agency Contacted: 1600 Representative spoke with at DME Agency: [place]: RN [place] Agency: [place] Date [place]: 10/01/20 Time [place]:  [phone] Representative spoke with at [place]: Tommi Rumps  Prior Living Arrangements/Services Living arrangements for the past 2 months: [place] Lives with:: Spouse Patient language and need for interpreter reviewed:: Yes Do you feel safe going back to the place where you live?: Yes      Need for Family Participation in Patient Care: Yes (Comment) Care giver support system in place?: Yes (comment)   Criminal Activity/Legal Involvement Pertinent to Current Situation/Hospitalization: No - Comment as needed  Activities of Daily Living Home Assistive Devices/Equipment: Eyeglasses ADL Screening (condition at time of admission) Patient's cognitive ability adequate to safely complete daily activities?: Yes Is the patient deaf or have difficulty hearing?: No Does the patient have difficulty seeing, even when wearing glasses/contacts?: No Does the patient have difficulty concentrating, remembering, or making decisions?: No Patient able to express need for assistance with ADLs?: Yes Does the patient have difficulty dressing or bathing?: No Independently performs ADLs?: Yes (appropriate for developmental age) Does the patient have difficulty walking or climbing stairs?: No Weakness of Legs: None Weakness of Arms/Hands: None  Permission Sought/Granted                  Emotional Assessment       Orientation: : Oriented to Self,Oriented to Place,Oriented to  Time,Oriented to Situation Alcohol / Substance Use: Not Applicable Psych Involvement: No (comment)  Admission diagnosis:  Gastric outlet obstruction [K31.1] Patient Active  Problem List   Diagnosis Date Noted  . Gastric outlet obstruction 09/12/2020  . Gastric adenocarcinoma ([place] ) 09/12/2020  . Essential hypertension 09/12/2020   PCP:  Serita Grammes, MD Pharmacy:   [facility] [place] [address] [place] [address] Phone: [phone] Fax:  [phone]     Social Determinants of Health (SDOH) Interventions    Readmission Risk Interventions No flowsheet data found.

## 2020-10-01 NOTE — Progress Notes (Signed)
Pharmacy consulted to review anticoagulation after IR procedure per protocol. Patient with "standard" risk of bleeding post port placement. Patient on sq heparin. Next dose due at 1400. Will hold 1400 dose as is <6 hours from end of procedure per protocol. Will resume at 2200.   Zyler Hyson A. Levada Dy, PharmD, BCPS, FNKF Clinical Pharmacist [place] Please utilize Amion for appropriate phone number to reach the unit pharmacist ([place])

## 2020-10-01 NOTE — Progress Notes (Signed)
[facility] Progress Note  11 Days Post-Op  Subjective: CC-  Comfortable this morning. J tube feedings held at midnight for port to be placed today. He vomited once yesterday morning but none since then. Denies any current n/v. BM yesterday. He took in a few bites here and there of full liquids yesterday.  Objective: Vital signs in last 24 hours: Temp:  [98.1 F (36.7 C)-98.6 F (37 C)] 98.3 F (36.8 C) (05/17 0421) Pulse Rate:  [79-81] 80 (05/17 0421) Resp:  [17-18] 18 (05/17 0421) BP: (115-152)/(84-99) 115/84 (05/17 0421) SpO2:  [94 %-96 %] 95 % (05/17 0421) Weight:  [75.7 kg] 75.7 kg (05/17 0422) Last BM Date: 09/30/20  Intake/Output from previous day: 05/16 0701 - 05/17 0700 In: 225  Out: [place] [Urine:800; Emesis/NG output:1000; Drains:40] Intake/Output this shift: Total I/O In: -  Out: 220 [Urine:200; Drains:20]  PE: Gen: Alert, NAD, pleasant Pulm:rate and effort normal Abd: Soft,nondistended,+BS, nontender. Midlineincision with staples present and no erythema or drainage. Drain serous fluid in bulb. J-tube clamped  Lab Results:  No results for input(s): WBC, HGB, HCT, PLT in the last 72 hours. BMET Recent Labs    09/29/20 0323 10/01/20 0340  NA 130* 131*  K 4.1 4.1  CL 94* 94*  CO2 26 28  GLUCOSE 117* 109*  BUN 25* 26*  CREATININE 0.97 0.99  CALCIUM 8.5* 8.5*   PT/INR No results for input(s): LABPROT, INR in the last 72 hours. CMP     Component Value Date/Time   NA 131 (L) 10/01/2020 0340   K 4.1 10/01/2020 0340   CL 94 (L) 10/01/2020 0340   CO2 28 10/01/2020 0340   GLUCOSE 109 (H) 10/01/2020 0340   BUN 26 (H) 10/01/2020 0340   CREATININE 0.99 10/01/2020 0340   CALCIUM 8.5 (L) 10/01/2020 0340   PROT 5.8 (L) 09/26/2020 0418   ALBUMIN 2.4 (L) 09/26/2020 0418   AST 31 09/26/2020 0418   ALT 48 (H) 09/26/2020 0418   ALKPHOS 95 09/26/2020 0418   BILITOT 0.7 09/26/2020 0418   GFRNONAA >60 10/01/2020 0340   Lipase  No results  found for: LIPASE     Studies/Results: No results found.  Anti-infectives: Anti-infectives (From admission, onward)   Start     Dose/Rate Route Frequency Ordered Stop   09/20/20 1100  metroNIDAZOLE (FLAGYL) IVPB 500 mg        500 mg 100 mL/hr over 60 Minutes Intravenous To Short Stay 09/20/20 1049 09/20/20 1355   09/20/20 0600  ceFAZolin (ANCEF) IVPB 2g/100 mL premix        2 g 200 mL/hr over 30 Minutes Intravenous On call to O.R. 09/19/20 0940 09/20/20 1324       Assessment/Plan GOO from a gastric cancer at the antrum S/p ex lap,GJ bypass J tubeplace, placement of surgical drain - Dr. Zenia Resides - 09/20/20 - POD #11 -OR findings:Unresectable locally advanced tumor in the gastric antrum, invading the head of the pancreas. No evidence of metastatic disease within the abdomen.  -Mobilize.PTrec no follow up - Onc following - Dr. Marin Olp. IR to place port today - Aiming for discharge tomorrow. Plan to d/c JP drain and staples prior to discharge. He will continue nocturnal J tube feedings at home - continue liquid diet as tolerated, antinausea medications PRN  FEN -NPO for procedure VTE -SCDs, hep subq ID -Ancef/flagyl periop Foley -out, voiding   LOS: 19 days    Wellington Hampshire, [facility] 10/01/2020, 8:26 AM Please see Amion  for pager number during day hours 7:00am-4:30pm

## 2020-10-01 NOTE — Discharge Instructions (Signed)
[facility], [place] [phone]  OPEN ABDOMINAL SURGERY: POST OP INSTRUCTIONS  Always review your discharge instruction sheet given to you by the facility where your surgery was performed.  IF YOU HAVE DISABILITY OR FAMILY LEAVE FORMS, YOU MUST BRING THEM TO THE OFFICE FOR PROCESSING.  PLEASE DO NOT GIVE THEM TO YOUR DOCTOR.  1. A prescription for pain medication may be given to you upon discharge.  Take your pain medication as prescribed, if needed.  If narcotic pain medicine is not needed, then you may take acetaminophen (Tylenol) or ibuprofen (Advil) as needed. 2. Take your usually prescribed medications unless otherwise directed. 3. If you need a refill on your pain medication, please contact your pharmacy. They will contact our office to request authorization.  Prescriptions will not be filled after 5pm or on week-ends. 4. You should follow a light diet the first few days after arrival home, such as soup and crackers, pudding, etc.unless your doctor has advised otherwise. A high-fiber, low fat diet can be resumed as tolerated.   Be sure to include lots of fluids daily. Most patients will experience some swelling and bruising on the chest and neck area.  Ice packs will help.  Swelling and bruising can take several days to resolve 5. Most patients will experience some swelling and bruising in the area of the incision. Ice pack will help. Swelling and bruising can take several days to resolve..  6. It is common to experience some constipation if taking pain medication after surgery.  Increasing fluid intake and taking a stool softener will usually help or prevent this problem from occurring.  A mild laxative (Milk of Magnesia or Miralax) should be taken according to package directions if there are no bowel movements after 48 hours. 7.  ACTIVITIES:  You may resume regular (light) daily activities beginning the next day--such as daily self-care, walking, climbing stairs--gradually  increasing activities as tolerated.  You may have sexual intercourse when it is comfortable.  Refrain from any heavy lifting or straining until approved by your doctor. a. You may drive when you no longer are taking prescription pain medication, you can comfortably wear a seatbelt, and you can safely maneuver your car and apply brakes 8. You should see your doctor in the office for a follow-up appointment approximately two weeks after your surgery.  Make sure that you call for this appointment within a day or two after you arrive home to insure a convenient appointment time.  WHEN TO CALL YOUR DOCTOR: 1. Fever over 101.0 2. Inability to urinate 3. Nausea and/or vomiting 4. Extreme swelling or bruising 5. Continued bleeding from incision. 6. Increased pain, redness, or drainage from the incision. 7. Difficulty swallowing or breathing 8. Muscle cramping or spasms. 9. Numbness or tingling in hands or feet or around lips.  The clinic staff is available to answer your questions during regular business hours.  Please don't hesitate to call and ask to speak to one of the nurses if you have concerns.  For further questions, please visit [email or web address]    How to Care for a Feeding Tube A feeding tube is a tube used to give medicine, water, and liquid food. A person may have this tube if she or he has trouble swallowing or cannot take food or medicine by mouth. Supplies needed to care for the tube site:  Clean gloves.  Clean washcloth, gauze pads, or soft paper towel.  Cotton swabs.  A skin barrier ointment  or cream, such as petroleum jelly.  Soap and water.  Pre-cut foam pads or gauze (for around the tube).  Tube tape.  An anchoring device (optional). How to care for the tube site 1. Have all supplies ready and close to you. 2. Wash your hands with soap and water for at least 20 seconds. 3. Put on the gloves. 4. Change any pad or gauze near the tube if: ? It is  dirty. ? It is wet. ? It has been there for more than one day. 5. Check the skin around the tube. Call the doctor if you see any of these: ? Red skin. ? A rash. ? Swelling. ? Leaking fluid. ? Extra skin. 6. Dip the gauze and cotton swabs in water and soap. 7. Use the cotton swabs to wipe the skin that is closest to the tube. 8. Use the gauze pads to wipe the rest of the skin near the tube. 9. Rinse with water. 10. Dry the area with a clean washcloth, dry gauze pad, or soft paper towel. 11. If the skin is red, use a cotton swab to put on a skin barrier cream or ointment. Put it on by making little circles. Do not apply antibiotic ointments at the tube site. 12. Put a new pre-cut foam pad or gauze around the tube. If there is no fluid leaking at the tube site, you do not need a pad or gauze. 13. Tape down the edges. 14. Use tape or an anchoring device to attach the tube to the skin. Do this for comfort or as told. Each time you use tape, put it in a different place. 15. Help the person sit halfway up (about a 30- to 45-degree angle). 16. Throw away used supplies. 17. Take off your gloves. 18. Wash your hands with soap and water for at least 20 seconds.      Supplies needed to flush a feeding tube:  Clean gloves.  A clean 60 mL syringe that connects to the feeding tube.  A towel.  Germ-free (sterile) or purified water. Follow these rules: ? Use germ-free water if:  Your body's defense system (immune system) is weak and you have trouble getting better from infections (are immunocompromised).  You do not know how many chemicals are in your water. ? Do not use water from lakes or other bodies of water unless you treat it or filter it first. ? To make drinking water pure by boiling:  Boil water for 1 minute or longer. Keep a lid over the water while it boils.  Let the water cool to room temperature before you use it. How to flush a feeding tube 1. Have all supplies ready and  close to you. 2. Wash your hands with soap and water for at least 20 seconds. 3. Put on gloves. 4. Pull 30 mL of water into the syringe. 5. Before you push water into the tube (flush the tube), put the towel under the tube to catch any fluid leaks. 6. Bend or kink the feeding tube while you do one of these things: ? Disconnect it from the feeding-bag tubing. ? Take off the cap at the end of the tube. 7. Put the tip of the syringe into the end of the feeding tube. 8. Stop bending the tube. 9. Use the syringe to slowly put the water in the tube. If the water will not go in the tube: ? Have the person lie on his or her left side. ? Try  putting the water in the tube again. ? Do not push hard to make the water go in. 10. Take out the syringe and put the cap on the tube. 11. Throw away used supplies. 12. Take off your gloves. 13. Wash your hands with soap and water for at least 20 seconds.      General tips and recommendations Caring for the tube  If the person has a foam pad or gauze near the tube, change it: ? Every day. ? When it is dirty. ? When it is wet.  Do not put antibiotic ointments by the tube. Flushing the tube  Do not use a syringe that is smaller than 60 mL.  Flush the tube at all of these times: ? Before you give medicine. ? Between medicines. ? After the person gets the last medicine before a feeding.  Do not mix medicines with formula. Do not mix medicines with other medicines.  Completely flush medicines through the tube. That way, they will not mix with formula. Contact a doctor if:  The tube gets blocked or clogged.  You find any of these on the skin around the tube site: ? Red skin. ? A rash. ? Swelling. ? Leaking fluid. ? Extra skin. Summary  A feeding tube is a tube used to give medicine, water, and liquid food. A person may have this tube if she or he has trouble swallowing or cannot take food or medicine by mouth.  Follow the doctor's  instructions to care for the tube site and flush the tube every day.  Contact a doctor if the tube gets blocked or clogged. This information is not intended to replace advice given to you by your health care provider. Make sure you discuss any questions you have with your health care provider. Document Revised: 08/31/2019 Document Reviewed: 08/31/2019 Elsevier Patient Education  2021 [employer].

## 2020-10-01 NOTE — Procedures (Signed)
Pre Procedure Dx: Poor venous access Post Procedural Dx: Same  Successful placement of right IJ approach port-a-cath with tip at the superior caval atrial junction. The catheter is ready for immediate use.  Estimated Blood Loss: Minimal  Complications: None immediate.  Jay Anayla Giannetti, MD Pager #: [phone]   

## 2020-10-01 NOTE — Sedation Documentation (Signed)
Attempt made x 2 to call report to 5W30 care RN. Care RN not available to receive report at this time. Report given to charge RN, Clarise Cruz. All questions answered at this time. Patient resting comfortably and VSS. Patient being monitored by this RN until transport arrives. Patient to be transported back to 5W30 via transport.

## 2020-10-01 NOTE — Sedation Documentation (Signed)
Attempt made x 1 to call report to 5W30 care RN. Care RN not available at this time to receive report. Will call back in 10 minutes.

## 2020-10-01 NOTE — Progress Notes (Addendum)
Nutrition Follow-up  DOCUMENTATION CODES:   Obesity unspecified  INTERVENTION:   -D/c Boost Breeze po TID, each supplement provides 250 kcal and 9 grams of protein -Ensure Enlive po BID, each supplement provides 350 kcal and 20 grams of protein -Magic cup TID with meals, each supplement provides 290 kcal and 9 grams of protein -Continue nocturnal TF:   Osmolite 1.5 formula at rate of 75 ml/hr x 16 hours (1600-0800).  Provide 45 ml Prosource TF TID per tube.  Free water flush 150 ml q 4 hours.   Tube feeding regimen to provide 1920 kcal, 108 grams of protein, and 1812 ml free water.   NUTRITION DIAGNOSIS:   Increased nutrient needs related to chronic illness (gastric cancer) as evidenced by estimated needs.  Ongoing  GOAL:   Patient will meet greater than or equal to 90% of their needs  Progressing   MONITOR:   PO intake,Diet advancement,Supplement acceptance,Labs,Weight trends,TF tolerance,Skin,I & O's  REASON FOR ASSESSMENT:   Consult Enteral/tube feeding initiation and management  ASSESSMENT:   Alexander [name] is a 65 y.o. male with medical history significant for COPD, HTN, HLD, GERD who was admitted from [facility] for management of newly diagnosed gastric malignancy with gastric outlet obstruction, he had work-up at [facility] which included EGD and biopsy showing gastric adenocarcinoma, NG tube was placed and he was transferred to [facility] for further treatment.  4/22- NGT placed, connected to low, intermittent suction 4/29- PICC placed, TPN initiated 5/6- s/pProcedure(s): FEEDING JEJUNOSTOMY (N/A) GASTROJEJUNOSTOMY; NGT replaced- connected to low, intermittent suction 5/10- TF initiated 5/12- TPN d/c 5/13- d/c NGT, advanced to clear liquid diet 5/14- transitioned to nocturnal TF 5/17- s/p port-a-cath placement, advanced to full liquids  Per general surgery notes, OR findings reveal "Unresectable locally advanced tumor in the  gastric antrum, invading the head of the pancreas. No evidence of metastatic disease within the abdomen".  Case discussed with RNCM, PA from general surgery, and Pam Chandler of [facility]. Plan for pt to discharge tomorrow on home TF. Discussed home TF recommendations (nocturnal feeds).   Pt just advanced to full liquid diet. Noted meal completion 10-50%. Pt is refusing Boost Breeze supplements.   Pt tolerating TF well. Case also discussed with [facility] RDs.   Labs reviewed: Na: 131, CBGS: 79-119.  Diet Order:   Diet Order            Diet full liquid Room service appropriate? Yes; Fluid consistency: Thin  Diet effective now                 EDUCATION NEEDS:   Education needs have been addressed  Skin:  Skin Assessment: Reviewed RN Assessment Skin Integrity Issues:: Incisions Incisions: closed abdomen  Last BM:  09/30/20  Height:   Ht Readings from Last 1 Encounters:  09/13/20 5\' 2"  (1.575 m)    Weight:   Wt Readings from Last 1 Encounters:  10/01/20 75.7 kg    Ideal Body Weight:  53.6 kg  BMI:  Body mass index is 30.52 kg/m.  Estimated Nutritional Needs:   Kcal:  1900-2100  Protein:  100-120 gm  Fluid:  > 1.9 L    Loistine Chance, RD, LDN, [place] Registered Dietitian II Certified Diabetes Care and Education Specialist Please refer to [facility] for RD and/or RD on-call/weekend/after hours pager

## 2020-10-01 NOTE — Progress Notes (Signed)
PROGRESS NOTE        PATIENT DETAILS Name: Alexander [name] Age: 65 y.o. Sex: male Date of Birth: March 12, 1956 Admit Date: 09/12/2020 Admitting Physician Lenore Cordia, MD MWN:[id number], Anderson Malta, MD  Brief Narrative: Patient is a 65 y.o. male with history of COPD, HTN, HLD, GERD-who was found to have gastric outlet obstruction due to a newly diagnosed gastric malignancy-he was transferred to [facility] for further evaluation and treatment.  He underwent exploratory laparotomy-GJ bypass and G-tube placement-he unfortunately was found to have a unresectable tumor.  Significant events: 4/28>> transfer from [place] to [facility] for evaluation of gastric outlet obstruction due to malignancy.  Significant studies: None  Antimicrobial therapy: None  Microbiology data: None  Procedures : 5/6>> exploratory laparotomy-gastrojejunostomy-feeding jejunostomy. 5/17>> Port-A-Cath placement by IR.  Consults: General surgery, oncology  DVT Prophylaxis : heparin injection 5,000 Units Start: 09/24/20 2200 SCDs Start: 09/12/20 2127   Subjective: No vomiting overnight-feels better.  Assessment/Plan: Gastric outlet obstruction due to gastric adenocarcinoma: Evaluated by general surgery-underwent laparotomy where he was found to have a unresectable gastric tumor-he subsequently underwent gastrojejunostomy and J-tube placement.  NG tube has been removed-no longer on TNA-General surgery following-has been switched to nocturnal feeding.  Continues to have intermittent episodes of emesis-however passing gas/having BMs.  Continue supportive care-General surgery following.   Hyponatremia: Mild-stable for monitoring without the need any for further work-up  HTN: Stable-continue clonidine patch-no longer on Nitropaste.  Upper GI bleeding: This occurred at [facility]-this has resolved-on PPI.  Normocytic anemia: Likely due to acute illness.  Follow CBC  periodically.  Thrombocytopenia: Resolved.  HIT antibody was negative.  Obesity: Estimated body mass index is 30.52 kg/m as calculated from the following:   Height as of this encounter: '5\' 2"'  (1.575 m).   Weight as of this encounter: 75.7 kg.    Diet: Diet Order            Diet regular Room service appropriate? Yes; Fluid consistency: Thin  Diet effective now                  Code Status: Full code  Family Communication: Spouse at bedside on 5/11   Disposition Plan: Status is: Inpatient  Remains inpatient appropriate because:Inpatient level of care appropriate due to severity of illness   Dispo: The patient is from: Home              Anticipated d/c is to: Home              Patient currently is not medically stable to d/c.   Difficult to place patient No   Barriers to Discharge: Diet being advanced-still vomiting-not yet stable for discharge.  Antimicrobial agents: Anti-infectives (From admission, onward)   Start     Dose/Rate Route Frequency Ordered Stop   09/20/20 1100  metroNIDAZOLE (FLAGYL) IVPB 500 mg        500 mg 100 mL/hr over 60 Minutes Intravenous To Short Stay 09/20/20 1049 09/20/20 1355   09/20/20 0600  ceFAZolin (ANCEF) IVPB 2g/100 mL premix        2 g 200 mL/hr over 30 Minutes Intravenous On call to O.R. 09/19/20 0940 09/20/20 1324       Time spent: 25 minutes-Greater than 50% of this time was spent in counseling, explanation of diagnosis, planning of further management, and coordination of  care.  MEDICATIONS: Scheduled Meds: . Chlorhexidine Gluconate Cloth  6 each Topical Daily  . cloNIDine  0.3 mg Transdermal Q Fri  . feeding supplement  1 Container Oral BID BM  . feeding supplement (OSMOLITE 1.5 CAL)  1,200 mL Per Tube Q24H  . feeding supplement (PROSource TF)  45 mL Per Tube TID  . free water  150 mL Per Tube Q4H  . heparin injection (subcutaneous)  5,000 Units Subcutaneous Q8H  . heparin lock flush      . lidocaine-EPINEPHrine       . multivitamin with minerals  1 tablet Per Tube QHS  . pantoprazole sodium  40 mg Per Tube BID  . pneumococcal 23 valent vaccine  0.5 mL Intramuscular Tomorrow-1000   Continuous Infusions: . promethazine (PHENERGAN) injection (IM or IVPB)     PRN Meds:.acetaminophen **OR** acetaminophen, artificial tears, hydrALAZINE, labetalol, methocarbamol, morphine injection, [DISCONTINUED] ondansetron **OR** ondansetron (ZOFRAN) IV, ondansetron, oxyCODONE, promethazine (PHENERGAN) injection (IM or IVPB), sodium chloride flush   PHYSICAL EXAM: Vital signs: Vitals:   10/01/20 0855 10/01/20 0900 10/01/20 0905 10/01/20 0923  BP: 114/86 133/83 128/83 135/85  Pulse: 79 82 80 80  Resp: '14 13 15 16  ' Temp:      TempSrc:      SpO2: 97% 96% 97% 99%  Weight:      Height:       Filed Weights   09/29/20 0359 09/30/20 0140 10/01/20 0422  Weight: 77 kg 77.1 kg 75.7 kg   Body mass index is 30.52 kg/m.   Gen Exam:Alert awake-not in any distress HEENT:atraumatic, normocephalic Chest: B/L clear to auscultation anteriorly CVS:S1S2 regular Abdomen:soft non tender, non distended Extremities:no edema Neurology: Non focal Skin: no rash  I have personally reviewed following labs and imaging studies  LABORATORY DATA: CBC: Recent Labs  Lab 09/28/20 0356  WBC 14.7*  HGB 9.5*  HCT 26.4*  MCV 95.3  PLT 786    Basic Metabolic Panel: Recent Labs  Lab 09/26/20 0418 09/28/20 0356 09/28/20 1043 09/29/20 0323 10/01/20 0340  NA 138 129* 130* 130* 131*  K 4.2 4.6 4.0 4.1 4.1  CL 102 95* 97* 94* 94*  CO2 '27 27 29 26 28  ' GLUCOSE 148* 111* 95 117* 109*  BUN 27* 25* 23 25* 26*  CREATININE 0.94 0.89 0.95 0.97 0.99  CALCIUM 8.9 7.9* 8.1* 8.5* 8.5*  MG 2.1  --   --   --   --   PHOS 4.9*  --   --   --   --     GFR: Estimated Creatinine Clearance: 65.4 mL/min (by C-G formula based on SCr of 0.99 mg/dL).  Liver Function Tests: Recent Labs  Lab 09/26/20 0418  AST 31  ALT 48*  ALKPHOS 95   BILITOT 0.7  PROT 5.8*  ALBUMIN 2.4*   No results for input(s): LIPASE, AMYLASE in the last 168 hours. No results for input(s): AMMONIA in the last 168 hours.  Coagulation Profile: No results for input(s): INR, PROTIME in the last 168 hours.  Cardiac Enzymes: No results for input(s): CKTOTAL, CKMB, CKMBINDEX, TROPONINI in the last 168 hours.  BNP (last 3 results) No results for input(s): PROBNP in the last 8760 hours.  Lipid Profile: No results for input(s): CHOL, HDL, LDLCALC, TRIG, CHOLHDL, LDLDIRECT in the last 72 hours.  Thyroid Function Tests: No results for input(s): TSH, T4TOTAL, FREET4, T3FREE, THYROIDAB in the last 72 hours.  Anemia Panel: No results for input(s): VITAMINB12, FOLATE, FERRITIN, TIBC, IRON, RETICCTPCT in  the last 72 hours.  Urine analysis: No results found for: COLORURINE, APPEARANCEUR, LABSPEC, PHURINE, GLUCOSEU, HGBUR, BILIRUBINUR, KETONESUR, PROTEINUR, UROBILINOGEN, NITRITE, LEUKOCYTESUR  Sepsis Labs: Lactic Acid, Venous No results found for: LATICACIDVEN  MICROBIOLOGY: No results found for this or any previous visit (from the past 240 hour(s)).  RADIOLOGY STUDIES/RESULTS: IR IMAGING GUIDED PORT INSERTION  Result Date: 10/01/2020 INDICATION: History of gastric cancer. In need of durable intravenous access for chemotherapy administration. EXAM: IMPLANTED PORT A CATH PLACEMENT WITH ULTRASOUND AND FLUOROSCOPIC GUIDANCE COMPARISON:  Chest CT-11/17/2019 MEDICATIONS: None ANESTHESIA/SEDATION: Moderate (conscious) sedation was employed during this procedure. A total of Versed 1 mg and Fentanyl 50 mcg was administered intravenously. Moderate Sedation Time: 23 minutes. The patient's level of consciousness and vital signs were monitored continuously by radiology nursing throughout the procedure under my direct supervision. CONTRAST:  None FLUOROSCOPY TIME:  12 seconds (3 mGy) COMPLICATIONS: None immediate. PROCEDURE: The procedure, risks, benefits, and  alternatives were explained to the patient. Questions regarding the procedure were encouraged and answered. The patient understands and consents to the procedure. The right neck and chest were prepped with chlorhexidine in a sterile fashion, and a sterile drape was applied covering the operative field. Maximum barrier sterile technique with sterile gowns and gloves were used for the procedure. A timeout was performed prior to the initiation of the procedure. Local anesthesia was provided with 1% lidocaine with epinephrine. After creating a small venotomy incision, a micropuncture kit was utilized to access the internal jugular vein. Real-time ultrasound guidance was utilized for vascular access including the acquisition of a permanent ultrasound image documenting patency of the accessed vessel. The microwire was utilized to measure appropriate catheter length. A subcutaneous port pocket was then created along the upper chest wall utilizing a combination of sharp and blunt dissection. The pocket was irrigated with sterile saline. A single lumen "Slim" sized power injectable port was chosen for placement. The 8 Fr catheter was tunneled from the port pocket site to the venotomy incision. The port was placed in the pocket. The external catheter was trimmed to appropriate length. At the venotomy, an 8 Fr peel-away sheath was placed over a guidewire under fluoroscopic guidance. The catheter was then placed through the sheath and the sheath was removed. Final catheter positioning was confirmed and documented with a fluoroscopic spot radiograph. The port was accessed with a Huber needle, aspirated and flushed with heparinized saline. The venotomy site was closed with an interrupted 4-0 Vicryl suture. The port pocket incision was closed with interrupted 2-0 Vicryl suture. Dermabond and Steri-strips were applied to both incisions. Dressings were applied. The patient tolerated the procedure well without immediate post  procedural complication. FINDINGS: After catheter placement, the tip lies within the superior cavoatrial junction. The catheter aspirates and flushes normally and is ready for immediate use. IMPRESSION: Successful placement of a right internal jugular approach power injectable Port-A-Cath. The catheter is ready for immediate use. Electronically Signed   By: Sandi Mariscal M.D.   On: 10/01/2020 09:42     LOS: 19 days   Oren Binet, MD  [facility]    To contact the attending provider between 7A-7P or the covering provider during after hours 7P-7A, please log into the web site [email or web address] and access using universal [place] password for that web site. If you do not have the password, please call the hospital operator.  10/01/2020, 12:04 PM

## 2020-10-02 ENCOUNTER — Encounter: Payer: PPO | Admitting: Gastroenterology

## 2020-10-02 DIAGNOSIS — R112 Nausea with vomiting, unspecified: Secondary | ICD-10-CM

## 2020-10-02 MED ORDER — CLONIDINE 0.3 MG/24HR TD PTWK: 0.3000 mg | MEDICATED_PATCH | TRANSDERMAL | 1 refills | Status: DC

## 2020-10-02 MED ORDER — ENSURE ENLIVE PO LIQD: 237.0000 mL | Freq: Two times a day (BID) | ORAL | 12 refills | Status: DC

## 2020-10-02 MED ORDER — OSMOLITE 1.5 CAL PO LIQD: 1200.0000 mL | ORAL | 0 refills | Status: DC

## 2020-10-02 MED ORDER — ONDANSETRON 4 MG PO TBDP: 4.0000 mg | ORAL_TABLET | Freq: Three times a day (TID) | ORAL | 0 refills | Status: DC | PRN

## 2020-10-02 MED ORDER — PROSOURCE TF PO LIQD: 45.0000 mL | Freq: Three times a day (TID) | ORAL | Status: DC

## 2020-10-02 MED ORDER — FREE WATER: 150.0000 mL | Status: DC

## 2020-10-02 NOTE — TOC Progression Note (Signed)
Transition of Care [facility]) - Progression Note    Patient Details  Name: Alexander [name] MRN: [id number] Date of Birth: May 18, 1956  [facility] CM/SW Contact  Carles Collet, RN Phone Number: 10/02/2020, 8:01 AM  Clinical Narrative:    Notified by [facility] that they are able to accept for home health services. They will start care at home tomorrow. (Patient will have education and supplies today, and start of home health tomorrow does NOT prevent discharge today) Pam w [facility] will teach patient and wife at bedside today prior to discharge how to use feeding pump, and supply with bags and formula.      Expected Discharge Plan: [place] Barriers to Discharge:  (home health provider and education on tube feeds)  Expected Discharge Plan and Services Expected Discharge Plan: [place]   Discharge Planning Services: CM Consult Post Acute Care Choice: [place] arrangements for the past 2 months: Single Family Home                 DME Arranged: Tube feeding pump,Tube feeding DME Agency:  Ysidro Evert) Date DME Agency Contacted: 10/01/20 Time DME Agency Contacted: 1600 Representative spoke with at DME Agency: [place]: RN [place] Agency: Ray Date [place]: 10/01/20 Time [place]: [phone] Representative spoke with at [place] Interventions    Readmission Risk Interventions No flowsheet data found.

## 2020-10-02 NOTE — Discharge Summary (Signed)
PATIENT DETAILS Name: Alexander [name] Age: 65 y.o. Sex: male Date of Birth: 01/29/1956 MRN: [id number]. Admitting Physician: Lenore Cordia, MD NUU:VOZDGUY, Anderson Malta, MD  Admit Date: 09/12/2020 Discharge date: 10/02/2020  Recommendations for Outpatient Follow-up:  1. Follow up with PCP in 1-2 weeks 2. Please obtain CMP/CBC in one week 3. Please ensure follow-up with oncology, general surgery  Admitted From:  Home  Disposition: Home with home health [place]: Yes  Equipment/Devices: None  Discharge Condition: Stable  CODE STATUS: FULL CODE  Diet recommendation:  Diet Order            DIET SOFT Room service appropriate? Yes; Fluid consistency: Thin  Diet effective now           Diet - low sodium heart healthy                  Brief Narrative: Patient is a 65 y.o. male with history of COPD, HTN, HLD, GERD-who was found to have gastric outlet obstruction due to a newly diagnosed gastric malignancy-he was transferred to [facility] for further evaluation and treatment.  He underwent exploratory laparotomy-GJ bypass and G-tube placement-he unfortunately was found to have a unresectable tumor.  Significant events: 4/28>> transfer from [place] to [facility] for evaluation of gastric outlet obstruction due to malignancy.  Significant studies: None  Antimicrobial therapy: None  Microbiology data: None  Procedures : 5/6>> exploratory laparotomy-gastrojejunostomy-feeding jejunostomy. 5/17>> Port-A-Cath placement by IR.  Consults: General surgery, oncology  Brief Hospital Course: Gastric outlet obstruction due to gastric adenocarcinoma: Evaluated by general surgery-underwent laparotomy where he was found to have a unresectable gastric tumor-he subsequently underwent gastrojejunostomy and J-tube placement.  NG tube has been removed-no longer on TNA-General surgery following-has been switched to nocturnal feeding.    He has had  intermittent issues of emesis/nausea/bloating postoperatively-which per surgery is to be expected.  Plans are to discharge home with home health services today.  Patient will require outpatient follow-up with general surgery, this MD spoke with oncology-Dr. Marin Olp on 5/18-he will arrange for follow-up at the cancer center in [place].  Hyponatremia: Mild-stable for monitoring without the need any for further work-up.  PCP/oncology to repeat electrolytes in 1 week.  HTN: Stable-continue clonidine patch-no longer on Nitropaste.  Upper GI bleeding: This occurred at [facility]-this has resolved-on PPI.  Normocytic anemia: Likely due to acute illness.  Follow CBC periodically.  Thrombocytopenia: Resolved.  HIT antibody was negative.  Obesity: Estimated body mass index is 30.52 kg/m as calculated from the following:   Height as of this encounter: '5\' 2"'  (1.575 m).   Weight as of this encounter: 75.7 kg.    Discharge Diagnoses:  Principal Problem:   Gastric adenocarcinoma ([place]) Active Problems:   Gastric outlet obstruction   Essential hypertension   Discharge Instructions:  Activity:  As tolerated with Full fall precautions use walker/cane & assistance as needed   Discharge Instructions    Ambulatory referral to Hematology / Oncology   Complete by: As directed    Newly diagnosed adenocarcinoma of the stomach with gastric outlet obstruction-needs a post hospital discharge visit.  Treatment options-hopefully within 2 weeks.   Call MD for:  difficulty breathing, headache or visual disturbances   Complete by: As directed    Call MD for:  redness, tenderness, or signs of infection (pain, swelling, redness, odor or green/yellow discharge around incision site)   Complete by: As directed    Diet - low sodium heart  healthy   Complete by: As directed    Discharge instructions   Complete by: As directed    Follow with Primary MD  Serita Grammes, MD in 1-2 weeks  Follow with  gen surgery as instructed-on 6/1 at 2:45 PM  You will receive a call from oncologist office-if you do not hear from them-please give them a call.    Please get a complete blood count and chemistry panel checked by your Primary MD at your next visit, and again as instructed by your Primary MD.  Get Medicines reviewed and adjusted: Please take all your medications with you for your next visit with your Primary MD  Laboratory/radiological data: Please request your Primary MD to go over all hospital tests and procedure/radiological results at the follow up, please ask your Primary MD to get all Hospital records sent to his/her office.  In some cases, they will be blood work, cultures and biopsy results pending at the time of your discharge. Please request that your primary care M.D. follows up on these results.  Also Note the following: If you experience worsening of your admission symptoms, develop shortness of breath, life threatening emergency, suicidal or homicidal thoughts you must seek medical attention immediately by calling 911 or calling your MD immediately  if symptoms less severe.  You must read complete instructions/literature along with all the possible adverse reactions/side effects for all the Medicines you take and that have been prescribed to you. Take any new Medicines after you have completely understood and accpet all the possible adverse reactions/side effects.   Do not drive when taking Pain medications or sleeping medications (Benzodaizepines)  Do not take more than prescribed Pain, Sleep and Anxiety Medications. It is not advisable to combine anxiety,sleep and pain medications without talking with your primary care practitioner  Special Instructions: If you have smoked or chewed Tobacco  in the last 2 yrs please stop smoking, stop any regular Alcohol  and or any Recreational drug use.  Wear Seat belts while driving.  Please note: You were cared for by a hospitalist  during your hospital stay. Once you are discharged, your primary care physician will handle any further medical issues. Please note that NO REFILLS for any discharge medications will be authorized once you are discharged, as it is imperative that you return to your primary care physician (or establish a relationship with a primary care physician if you do not have one) for your post hospital discharge needs so that they can reassess your need for medications and monitor your lab values.   Increase activity slowly   Complete by: As directed    No dressing needed   Complete by: As directed      Allergies as of 10/02/2020   No Known Allergies     Medication List    STOP taking these medications   Clenpiq 10-3.5-12 MG-GM -GM/160ML Soln Generic drug: Sod Picosulfate-Mag Ox-Cit Acd   ibuprofen 200 MG tablet Commonly known as: ADVIL   losartan 50 MG tablet Commonly known as: COZAAR     TAKE these medications   cloNIDine 0.3 mg/24hr patch Commonly known as: CATAPRES - Dosed in mg/24 hr Place 1 patch (0.3 mg total) onto the skin every Friday. Start taking on: Oct 04, 2020   feeding supplement (OSMOLITE 1.5 CAL) Liqd Place 1,200 mLs into feeding tube daily.   feeding supplement Liqd Take 237 mLs by mouth 2 (two) times daily between meals.   feeding supplement (PROSource TF) liquid Place 45 mLs  into feeding tube 3 (three) times daily.   free water Soln Place 150 mLs into feeding tube every 4 (four) hours.   ondansetron 4 MG disintegrating tablet Commonly known as: Zofran ODT Take 1 tablet (4 mg total) by mouth every 8 (eight) hours as needed for nausea or vomiting.   pantoprazole 40 MG tablet Commonly known as: PROTONIX Take 40 mg by mouth at bedtime.   rosuvastatin 10 MG tablet Commonly known as: CRESTOR Take 10 mg by mouth every morning.            Durable Medical Equipment  (From admission, onward)         Start     Ordered   10/01/20 1545  For home use only  DME Tube feeding pump  Once       Question:  Length of Need  Answer:  Lifetime   10/01/20 1548   10/01/20 1543  For home use only DME Tube feeding  Once       Comments: Osmolite 1.5 formula at rate of 75 ml/hr x 16 hours (1600-0800).  Provide 45 ml Prosource TF TID per tube.  Free water flush 150 ml q 4 hours.   Tube feeding regimen to provide 1920 kcal, 108 grams of protein, and 1812 ml free water.   10/01/20 1548           Discharge Care Instructions  (From admission, onward)         Start     Ordered   10/02/20 0000  No dressing needed        10/02/20 1224          Follow-up Information    [facility] Follow up.   Why: for tube feeds.  Pam at [phone] can assist if needed       Dwan Bolt, MD. Go on 10/16/2020.   Specialty: General Surgery Why: Your appointment is 6/1 at 2:45pm Please arrive 30 minutes prior to your appointment to check in and fill out paperwork. Bring photo ID and insurance information. Contact information: [place] [place] [address] [phone]        [name], MD Follow up.   Specialty: Oncology Why: office will call, if you dont hear from them in a few days, please give them a call Contact information: [address] [place] [address] [phone]        [facility] Follow up.   Specialty: Home Health Services Why: For home health services Contact information: [place] [address] [phone]              No Known Allergies   Other Procedures/Studies: DG Abd 1 View  Result Date: 09/17/2020 CLINICAL DATA:  Status post nasogastric tube placement EXAM: ABDOMEN - 1 VIEW COMPARISON:  September 12, 2020 FINDINGS: Nasogastric tube with tip and side port overlying the stomach. The bowel gas pattern is normal. No radio-opaque calculi or other significant radiographic abnormality are seen. IMPRESSION: Nasogastric tube with tip and side port  overlying the stomach. Electronically Signed   By: Dahlia Bailiff MD   On: 09/17/2020 13:07   DG Chest Port 1 View  Result Date: 09/13/2020 CLINICAL DATA:  Cough and shortness of breath EXAM: PORTABLE CHEST 1 VIEW COMPARISON:  Chest CT September 05, 2020 FINDINGS: Nasogastric tube tip and side port in stomach. No edema or airspace opacity. Heart size and pulmonary vascularity are normal. No adenopathy. No bone lesions.  IMPRESSION: Nasogastric tube tip and side port in stomach. Lungs clear. Cardiac silhouette within normal limits. Electronically Signed   By: Lowella Grip [name] M.D.   On: 09/13/2020 08:00   DG Abd Portable 1V  Result Date: 09/28/2020 CLINICAL DATA:  Bilious emesis EXAM: PORTABLE ABDOMEN - 1 VIEW COMPARISON:  09/17/2020 FINDINGS: Percutaneous gastrojejunostomy tube is identified within the left hemiabdomen. There is a surgical drainage catheter overlying the right abdomen. Skin staples noted. The bowel gas pattern is normal. No radio-opaque calculi or other significant radiographic abnormality are seen. IMPRESSION: Nonobstructive bowel gas pattern. Electronically Signed   By: Kerby Moors M.D.   On: 09/28/2020 10:49   DG Abd Portable 1V  Result Date: 09/12/2020 CLINICAL DATA:  NG tube in place.  Gastric outlet obstruction. EXAM: PORTABLE ABDOMEN - 1 VIEW COMPARISON:  Abdominal CTA 09/05/2020 FINDINGS: Tip and side port of the enteric tube are below the diaphragm in the stomach, enteric tube is coiled within the gastric body. Decreased gastric distension from prior CT. Generalized paucity of small bowel gas. Residual high-density contrast throughout the colon. Vascular calcifications are seen. IMPRESSION: Tip and side port of the enteric tube below the diaphragm in the stomach, enteric tube coiled within the gastric body. Electronically Signed   By: Keith Rake M.D.   On: 09/12/2020 23:28   IR IMAGING GUIDED PORT INSERTION  Result Date: 10/01/2020 INDICATION: History of gastric  cancer. In need of durable intravenous access for chemotherapy administration. EXAM: IMPLANTED PORT A CATH PLACEMENT WITH ULTRASOUND AND FLUOROSCOPIC GUIDANCE COMPARISON:  Chest CT-11/17/2019 MEDICATIONS: None ANESTHESIA/SEDATION: Moderate (conscious) sedation was employed during this procedure. A total of Versed 1 mg and Fentanyl 50 mcg was administered intravenously. Moderate Sedation Time: 23 minutes. The patient's level of consciousness and vital signs were monitored continuously by radiology nursing throughout the procedure under my direct supervision. CONTRAST:  None FLUOROSCOPY TIME:  12 seconds (3 mGy) COMPLICATIONS: None immediate. PROCEDURE: The procedure, risks, benefits, and alternatives were explained to the patient. Questions regarding the procedure were encouraged and answered. The patient understands and consents to the procedure. The right neck and chest were prepped with chlorhexidine in a sterile fashion, and a sterile drape was applied covering the operative field. Maximum barrier sterile technique with sterile gowns and gloves were used for the procedure. A timeout was performed prior to the initiation of the procedure. Local anesthesia was provided with 1% lidocaine with epinephrine. After creating a small venotomy incision, a micropuncture kit was utilized to access the internal jugular vein. Real-time ultrasound guidance was utilized for vascular access including the acquisition of a permanent ultrasound image documenting patency of the accessed vessel. The microwire was utilized to measure appropriate catheter length. A subcutaneous port pocket was then created along the upper chest wall utilizing a combination of sharp and blunt dissection. The pocket was irrigated with sterile saline. A single lumen "Slim" sized power injectable port was chosen for placement. The 8 Fr catheter was tunneled from the port pocket site to the venotomy incision. The port was placed in the pocket. The external  catheter was trimmed to appropriate length. At the venotomy, an 8 Fr peel-away sheath was placed over a guidewire under fluoroscopic guidance. The catheter was then placed through the sheath and the sheath was removed. Final catheter positioning was confirmed and documented with a fluoroscopic spot radiograph. The port was accessed with a Huber needle, aspirated and flushed with heparinized saline. The venotomy site was closed with an interrupted 4-0 Vicryl suture.  The port pocket incision was closed with interrupted 2-0 Vicryl suture. Dermabond and Steri-strips were applied to both incisions. Dressings were applied. The patient tolerated the procedure well without immediate post procedural complication. FINDINGS: After catheter placement, the tip lies within the superior cavoatrial junction. The catheter aspirates and flushes normally and is ready for immediate use. IMPRESSION: Successful placement of a right internal jugular approach power injectable Port-A-Cath. The catheter is ready for immediate use. Electronically Signed   By: Sandi Mariscal M.D.   On: 10/01/2020 09:42   [place] EKG SITE RITE  Result Date: 09/13/2020 If Site Rite image not attached, placement could not be confirmed due to current cardiac rhythm.    TODAY-DAY OF DISCHARGE:  Subjective:   Yaakov Guthrie today has no headache,no chest abdominal pain,no new weakness tingling or numbness, feels much better wants to go home today.   Objective:   Blood pressure 127/87, pulse 79, temperature 98.6 F (37 C), temperature source Oral, resp. rate 18, height '5\' 2"'  (1.575 m), weight 76.3 kg, SpO2 95 %.  Intake/Output Summary (Last 24 hours) at 10/02/2020 1224 Last data filed at 10/02/2020 1100 Gross per 24 hour  Intake 555 ml  Output 460 ml  Net 95 ml   Filed Weights   09/30/20 0140 10/01/20 0422 10/02/20 0433  Weight: 77.1 kg 75.7 kg 76.3 kg    Exam: Awake Alert, Oriented *3, No new F.N deficits, Normal affect [place].AT,PERRAL Supple  Neck,No JVD, No cervical lymphadenopathy appriciated.  Symmetrical Chest wall movement, Good air movement bilaterally, CTAB RRR,No Gallops,Rubs or new Murmurs, No Parasternal Heave +ve B.Sounds, Abd Soft, Non tender, No organomegaly appriciated, No rebound -guarding or rigidity. No Cyanosis, Clubbing or edema, No new Rash or bruise   PERTINENT RADIOLOGIC STUDIES: IR IMAGING GUIDED PORT INSERTION  Result Date: 10/01/2020 INDICATION: History of gastric cancer. In need of durable intravenous access for chemotherapy administration. EXAM: IMPLANTED PORT A CATH PLACEMENT WITH ULTRASOUND AND FLUOROSCOPIC GUIDANCE COMPARISON:  Chest CT-11/17/2019 MEDICATIONS: None ANESTHESIA/SEDATION: Moderate (conscious) sedation was employed during this procedure. A total of Versed 1 mg and Fentanyl 50 mcg was administered intravenously. Moderate Sedation Time: 23 minutes. The patient's level of consciousness and vital signs were monitored continuously by radiology nursing throughout the procedure under my direct supervision. CONTRAST:  None FLUOROSCOPY TIME:  12 seconds (3 mGy) COMPLICATIONS: None immediate. PROCEDURE: The procedure, risks, benefits, and alternatives were explained to the patient. Questions regarding the procedure were encouraged and answered. The patient understands and consents to the procedure. The right neck and chest were prepped with chlorhexidine in a sterile fashion, and a sterile drape was applied covering the operative field. Maximum barrier sterile technique with sterile gowns and gloves were used for the procedure. A timeout was performed prior to the initiation of the procedure. Local anesthesia was provided with 1% lidocaine with epinephrine. After creating a small venotomy incision, a micropuncture kit was utilized to access the internal jugular vein. Real-time ultrasound guidance was utilized for vascular access including the acquisition of a permanent ultrasound image documenting patency of the  accessed vessel. The microwire was utilized to measure appropriate catheter length. A subcutaneous port pocket was then created along the upper chest wall utilizing a combination of sharp and blunt dissection. The pocket was irrigated with sterile saline. A single lumen "Slim" sized power injectable port was chosen for placement. The 8 Fr catheter was tunneled from the port pocket site to the venotomy incision. The port was placed in the pocket. The external catheter  was trimmed to appropriate length. At the venotomy, an 8 Fr peel-away sheath was placed over a guidewire under fluoroscopic guidance. The catheter was then placed through the sheath and the sheath was removed. Final catheter positioning was confirmed and documented with a fluoroscopic spot radiograph. The port was accessed with a Huber needle, aspirated and flushed with heparinized saline. The venotomy site was closed with an interrupted 4-0 Vicryl suture. The port pocket incision was closed with interrupted 2-0 Vicryl suture. Dermabond and Steri-strips were applied to both incisions. Dressings were applied. The patient tolerated the procedure well without immediate post procedural complication. FINDINGS: After catheter placement, the tip lies within the superior cavoatrial junction. The catheter aspirates and flushes normally and is ready for immediate use. IMPRESSION: Successful placement of a right internal jugular approach power injectable Port-A-Cath. The catheter is ready for immediate use. Electronically Signed   By: Sandi Mariscal M.D.   On: 10/01/2020 09:42     PERTINENT LAB RESULTS: CBC: No results for input(s): WBC, HGB, HCT, PLT in the last 72 hours. CMET CMP     Component Value Date/Time   NA 131 (L) 10/01/2020 0340   K 4.1 10/01/2020 0340   CL 94 (L) 10/01/2020 0340   CO2 28 10/01/2020 0340   GLUCOSE 109 (H) 10/01/2020 0340   BUN 26 (H) 10/01/2020 0340   CREATININE 0.99 10/01/2020 0340   CALCIUM 8.5 (L) 10/01/2020 0340    PROT 5.8 (L) 09/26/2020 0418   ALBUMIN 2.4 (L) 09/26/2020 0418   AST 31 09/26/2020 0418   ALT 48 (H) 09/26/2020 0418   ALKPHOS 95 09/26/2020 0418   BILITOT 0.7 09/26/2020 0418   GFRNONAA >60 10/01/2020 0340    GFR Estimated Creatinine Clearance: 65.7 mL/min (by C-G formula based on SCr of 0.99 mg/dL). No results for input(s): LIPASE, AMYLASE in the last 72 hours. No results for input(s): CKTOTAL, CKMB, CKMBINDEX, TROPONINI in the last 72 hours. Invalid input(s): POCBNP No results for input(s): DDIMER in the last 72 hours. No results for input(s): HGBA1C in the last 72 hours. No results for input(s): CHOL, HDL, LDLCALC, TRIG, CHOLHDL, LDLDIRECT in the last 72 hours. No results for input(s): TSH, T4TOTAL, T3FREE, THYROIDAB in the last 72 hours.  Invalid input(s): FREET3 No results for input(s): VITAMINB12, FOLATE, FERRITIN, TIBC, IRON, RETICCTPCT in the last 72 hours. Coags: No results for input(s): INR in the last 72 hours.  Invalid input(s): PT Microbiology: No results found for this or any previous visit (from the past 240 hour(s)).  FURTHER DISCHARGE INSTRUCTIONS:  Get Medicines reviewed and adjusted: Please take all your medications with you for your next visit with your Primary MD  Laboratory/radiological data: Please request your Primary MD to go over all hospital tests and procedure/radiological results at the follow up, please ask your Primary MD to get all Hospital records sent to his/her office.  In some cases, they will be blood work, cultures and biopsy results pending at the time of your discharge. Please request that your primary care M.D. goes through all the records of your hospital data and follows up on these results.  Also Note the following: If you experience worsening of your admission symptoms, develop shortness of breath, life threatening emergency, suicidal or homicidal thoughts you must seek medical attention immediately by calling 911 or calling your MD  immediately  if symptoms less severe.  You must read complete instructions/literature along with all the possible adverse reactions/side effects for all the Medicines you take and that  have been prescribed to you. Take any new Medicines after you have completely understood and accpet all the possible adverse reactions/side effects.   Do not drive when taking Pain medications or sleeping medications (Benzodaizepines)  Do not take more than prescribed Pain, Sleep and Anxiety Medications. It is not advisable to combine anxiety,sleep and pain medications without talking with your primary care practitioner  Special Instructions: If you have smoked or chewed Tobacco  in the last 2 yrs please stop smoking, stop any regular Alcohol  and or any Recreational drug use.  Wear Seat belts while driving.  Please note: You were cared for by a hospitalist during your hospital stay. Once you are discharged, your primary care physician will handle any further medical issues. Please note that NO REFILLS for any discharge medications will be authorized once you are discharged, as it is imperative that you return to your primary care physician (or establish a relationship with a primary care physician if you do not have one) for your post hospital discharge needs so that they can reassess your need for medications and monitor your lab values.  Total Time spent coordinating discharge including counseling, education and face to face time equals 35 minutes.  SignedOren Binet 10/02/2020 12:24 PM

## 2020-10-02 NOTE — Progress Notes (Signed)
Removed JP drain (applied gauze and tape) and removed staples from midline incision with the application of steri strips per order. No distress or pain noted at this time. Will continue to monitor.

## 2020-10-02 NOTE — Progress Notes (Signed)
IV Team consult placed with comment to remove PICC.  Spoke with Vladimir Faster, RN and advised IV Team will need a PICC line removal order prior to line d/c.

## 2020-10-02 NOTE — Progress Notes (Signed)
Nutrition Follow-up  DOCUMENTATION CODES:   Obesity unspecified  INTERVENTION:   -Continue Ensure Enlive po BID, each supplement provides 350 kcal and 20 grams of protein -Continue Magic cup TID with meals, each supplement provides 290 kcal and 9 grams of protein -Continue nocturnal TF:   Osmolite 1.5 formula at rate of 75 ml/hr x 8 hours(1600-0000).  Provide 45 ml Prosource TF TID per tube.  Free water flush 150 ml q 4 hours.   Tube feeding regimen to provide 1020 kcal, 71 grams of protein, and 457 ml free water.Total free water: 1357 ml daily  NUTRITION DIAGNOSIS:   Increased nutrient needs related to chronic illness (gastric cancer) as evidenced by estimated needs.  Ongoing  GOAL:   Patient will meet greater than or equal to 90% of their needs  Progressing  MONITOR:   PO intake,Diet advancement,Supplement acceptance,Labs,Weight trends,TF tolerance,Skin,I & O's  REASON FOR ASSESSMENT:   Consult Enteral/tube feeding initiation and management  ASSESSMENT:   Alexander [name] is a 65 y.o. male with medical history significant for COPD, HTN, HLD, GERD who was admitted from [facility] for management of newly diagnosed gastric malignancy with gastric outlet obstruction, he had work-up at [facility] which included EGD and biopsy showing gastric adenocarcinoma, NG tube was placed and he was transferred to [facility] for further treatment.  4/22- NGT placed, connected to low, intermittent suction 4/29- PICC placed, TPN initiated 5/6- s/pProcedure(s): FEEDING JEJUNOSTOMY (N/A) GASTROJEJUNOSTOMY; NGT replaced- connected to low, intermittent suction 5/10- TF initiated 5/12- TPN d/c 5/13- d/c NGT, advanced to clear liquid diet 5/14- transitioned to nocturnal TF 5/17- s/p port-a-cath placement, advanced to full liquids 5/18- JP drain removed, advanced to soft diet, TF decreased to 8 hours per day per request of general surgery  Reviewed I/O's: -415  ml x 24 hours and -3.4 L since 09/18/20  UOP: 700 ml x 24 hours   Drain output: 60 ml x 24 hours  Per general surgery notes, OR findings reveal "Unresectable locally advanced tumor in the gastric antrum, invading the head of the pancreas. No evidence of metastatic disease within the abdomen".  Case discussed with RNCM and PA from general surgery. Due to bloating, general surgery is requesting to decreased nocturnal TF from 16 hour to 8 hours.  Pt advanced to soft diet. Noted meal completion 0-50%. He is refusing Ensure Enlive supplements.   Per MD notes, plan to discharge home today. Pt and family to be provided education on TF.   Labs reviewed: Na: 131, CBGS: 79-119.   Diet Order:   Diet Order            DIET SOFT Room service appropriate? Yes; Fluid consistency: Thin  Diet effective now           Diet - low sodium heart healthy                 EDUCATION NEEDS:   Education needs have been addressed  Skin:  Skin Assessment: Reviewed RN Assessment Skin Integrity Issues:: Incisions Incisions: closed abdomen  Last BM:  09/30/20  Height:   Ht Readings from Last 1 Encounters:  09/13/20 5\' 2"  (1.575 m)    Weight:   Wt Readings from Last 1 Encounters:  10/02/20 76.3 kg    Ideal Body Weight:  53.6 kg  BMI:  Body mass index is 30.77 kg/m.  Estimated Nutritional Needs:   Kcal:  1900-2100  Protein:  100-120 gm  Fluid:  > 1.9 L  Loistine Chance, RD, LDN, [place] Registered Dietitian II Certified Diabetes Care and Education Specialist Please refer to [facility] for RD and/or RD on-call/weekend/after hours pager

## 2020-10-02 NOTE — Progress Notes (Signed)
12 Days Post-Op   Subjective/Chief Complaint: Bloated this morning.  Tube feeds causing excessive bloating.  Bowels are moving.  No vomiting though.   Objective: Vital signs in last 24 hours: Temp:  [98.2 F (36.8 C)-98.6 F (37 C)] 98.6 F (37 C) (05/18 0411) Pulse Rate:  [72-82] 79 (05/18 0411) Resp:  [12-18] 18 (05/18 0411) BP: (114-156)/(83-99) 127/87 (05/18 0411) SpO2:  [94 %-100 %] 95 % (05/18 0411) Weight:  [76.3 kg] 76.3 kg (05/18 0433) Last BM Date: 09/30/20  Intake/Output from previous day: 05/17 0701 - 05/18 0700 In: 345 [P.O.:120] Out: 760 [Urine:700; Drains:60] Intake/Output this shift: No intake/output data recorded.   Gen: Alert, NAD, pleasant Pulm:rate and effort normal Abd: Soft,mild distention distended,+BS,nontender. Midlineincision with staples present and no erythema or drainage. Drainserous fluidin bulb. J-tubeclamped Lab Results:  No results for input(s): WBC, HGB, HCT, PLT in the last 72 hours. BMET Recent Labs    10/01/20 0340  NA 131*  K 4.1  CL 94*  CO2 28  GLUCOSE 109*  BUN 26*  CREATININE 0.99  CALCIUM 8.5*   PT/INR No results for input(s): LABPROT, INR in the last 72 hours. ABG No results for input(s): PHART, HCO3 in the last 72 hours.  Invalid input(s): PCO2, PO2  Studies/Results: IR IMAGING GUIDED PORT INSERTION  Result Date: 10/01/2020 INDICATION: History of gastric cancer. In need of durable intravenous access for chemotherapy administration. EXAM: IMPLANTED PORT A CATH PLACEMENT WITH ULTRASOUND AND FLUOROSCOPIC GUIDANCE COMPARISON:  Chest CT-11/17/2019 MEDICATIONS: None ANESTHESIA/SEDATION: Moderate (conscious) sedation was employed during this procedure. A total of Versed 1 mg and Fentanyl 50 mcg was administered intravenously. Moderate Sedation Time: 23 minutes. The patient's level of consciousness and vital signs were monitored continuously by radiology nursing throughout the procedure under my direct  supervision. CONTRAST:  None FLUOROSCOPY TIME:  12 seconds (3 mGy) COMPLICATIONS: None immediate. PROCEDURE: The procedure, risks, benefits, and alternatives were explained to the patient. Questions regarding the procedure were encouraged and answered. The patient understands and consents to the procedure. The right neck and chest were prepped with chlorhexidine in a sterile fashion, and a sterile drape was applied covering the operative field. Maximum barrier sterile technique with sterile gowns and gloves were used for the procedure. A timeout was performed prior to the initiation of the procedure. Local anesthesia was provided with 1% lidocaine with epinephrine. After creating a small venotomy incision, a micropuncture kit was utilized to access the internal jugular vein. Real-time ultrasound guidance was utilized for vascular access including the acquisition of a permanent ultrasound image documenting patency of the accessed vessel. The microwire was utilized to measure appropriate catheter length. A subcutaneous port pocket was then created along the upper chest wall utilizing a combination of sharp and blunt dissection. The pocket was irrigated with sterile saline. A single lumen "Slim" sized power injectable port was chosen for placement. The 8 Fr catheter was tunneled from the port pocket site to the venotomy incision. The port was placed in the pocket. The external catheter was trimmed to appropriate length. At the venotomy, an 8 Fr peel-away sheath was placed over a guidewire under fluoroscopic guidance. The catheter was then placed through the sheath and the sheath was removed. Final catheter positioning was confirmed and documented with a fluoroscopic spot radiograph. The port was accessed with a Huber needle, aspirated and flushed with heparinized saline. The venotomy site was closed with an interrupted 4-0 Vicryl suture. The port pocket incision was closed with interrupted 2-0 Vicryl suture.  Dermabond  and Steri-strips were applied to both incisions. Dressings were applied. The patient tolerated the procedure well without immediate post procedural complication. FINDINGS: After catheter placement, the tip lies within the superior cavoatrial junction. The catheter aspirates and flushes normally and is ready for immediate use. IMPRESSION: Successful placement of a right internal jugular approach power injectable Port-A-Cath. The catheter is ready for immediate use. Electronically Signed   By: Sandi Mariscal M.D.   On: 10/01/2020 09:42    Anti-infectives: Anti-infectives (From admission, onward)   Start     Dose/Rate Route Frequency Ordered Stop   09/20/20 1100  metroNIDAZOLE (FLAGYL) IVPB 500 mg        500 mg 100 mL/hr over 60 Minutes Intravenous To Short Stay 09/20/20 1049 09/20/20 1355   09/20/20 0600  ceFAZolin (ANCEF) IVPB 2g/100 mL premix        2 g 200 mL/hr over 30 Minutes Intravenous On call to O.R. 09/19/20 0940 09/20/20 1324      Assessment/Plan: s/p Procedure(s): FEEDING JEJUNOSTOMY (N/A) GASTROJEJUNOSTOMY GOO from a gastric cancer at the antrum S/p ex lap,GJ bypass J tubeplace, placement of surgical drain - Dr. Zenia Resides - 09/20/20 - POD #12 -OR findings:Unresectable locally advanced tumor in the gastric antrum, invading the head of the pancreas. No evidence of metastatic disease within the abdomen.  -Mobilize.PTrec no follow up - Onc following - Dr. Marin Olp. IR to place port today -Tube feeds causing bloating.  Decrease interval to 8 hours at night.  Advance diet to a soft diet at this point. Suspect to have more nausea and vomiting at times but this is not unexpected for a while.  Antiemetics at discharge would be helpful.  Prokinetics probably not much benefit but certainly can be added if need be.  Okay for discharge.  Adjusted tube feed schedule on discharge orders.  Remove staples today JP drain today.  Follow-up with Dr. Zenia Resides at [facility] in 1 to 2 weeks.  FEN -NPO for  procedure VTE -SCDs, hep subq ID -Ancef/flagyl periop Foley -out, voiding  LOS: 20 days    Joyice Faster Toby Breithaupt 10/02/2020

## 2020-10-02 NOTE — Progress Notes (Signed)
Alexander [name] to be D/C'd home per MD order. Discussed with the patient and all questions fully answered.  Skin clean and dry without evidence of skin break down. Steri strips in place to abdominal incision. Demonstrated to wife how to flush feeding tube and wife verbalized understanding. Dressing and pressure applied.  An After Visit Summary was printed and given to the patient.  Patient escorted via [place], and D/C home via private auto.  [name]  10/02/2020 3:53 PM

## 2020-10-03 DIAGNOSIS — C169 Malignant neoplasm of stomach, unspecified: Secondary | ICD-10-CM | POA: Diagnosis not present

## 2020-10-03 DIAGNOSIS — J449 Chronic obstructive pulmonary disease, unspecified: Secondary | ICD-10-CM | POA: Diagnosis not present

## 2020-10-03 DIAGNOSIS — E785 Hyperlipidemia, unspecified: Secondary | ICD-10-CM | POA: Diagnosis not present

## 2020-10-03 DIAGNOSIS — Z87891 Personal history of nicotine dependence: Secondary | ICD-10-CM | POA: Diagnosis not present

## 2020-10-03 DIAGNOSIS — Z9181 History of falling: Secondary | ICD-10-CM | POA: Diagnosis not present

## 2020-10-03 DIAGNOSIS — D630 Anemia in neoplastic disease: Secondary | ICD-10-CM | POA: Diagnosis not present

## 2020-10-03 DIAGNOSIS — E669 Obesity, unspecified: Secondary | ICD-10-CM | POA: Diagnosis not present

## 2020-10-03 DIAGNOSIS — K269 Duodenal ulcer, unspecified as acute or chronic, without hemorrhage or perforation: Secondary | ICD-10-CM | POA: Diagnosis not present

## 2020-10-03 DIAGNOSIS — Z483 Aftercare following surgery for neoplasm: Secondary | ICD-10-CM | POA: Diagnosis not present

## 2020-10-03 DIAGNOSIS — K219 Gastro-esophageal reflux disease without esophagitis: Secondary | ICD-10-CM | POA: Diagnosis not present

## 2020-10-03 DIAGNOSIS — E871 Hypo-osmolality and hyponatremia: Secondary | ICD-10-CM | POA: Diagnosis not present

## 2020-10-03 DIAGNOSIS — C7989 Secondary malignant neoplasm of other specified sites: Secondary | ICD-10-CM | POA: Diagnosis not present

## 2020-10-03 DIAGNOSIS — Z434 Encounter for attention to other artificial openings of digestive tract: Secondary | ICD-10-CM | POA: Diagnosis not present

## 2020-10-03 DIAGNOSIS — Z6829 Body mass index (BMI) 29.0-29.9, adult: Secondary | ICD-10-CM | POA: Diagnosis not present

## 2020-10-03 DIAGNOSIS — I10 Essential (primary) hypertension: Secondary | ICD-10-CM | POA: Diagnosis not present

## 2020-10-04 ENCOUNTER — Telehealth: Payer: Self-pay | Admitting: Oncology

## 2020-10-04 ENCOUNTER — Encounter: Payer: PPO | Admitting: Gastroenterology

## 2020-10-04 NOTE — Telephone Encounter (Signed)
Patient referred by Dr Burney Gauze for Gastric Adenocarcinoma.  Appt  Made for 10/08/20 Labs 3:30 pm - Consult 4:00 pm

## 2020-10-08 ENCOUNTER — Inpatient Hospital Stay: Payer: PPO | Attending: Oncology

## 2020-10-08 ENCOUNTER — Inpatient Hospital Stay (INDEPENDENT_AMBULATORY_CARE_PROVIDER_SITE_OTHER): Payer: PPO | Admitting: Oncology

## 2020-10-08 ENCOUNTER — Other Ambulatory Visit: Payer: Self-pay

## 2020-10-08 ENCOUNTER — Encounter: Payer: Self-pay | Admitting: Oncology

## 2020-10-08 ENCOUNTER — Other Ambulatory Visit: Payer: Self-pay | Admitting: Oncology

## 2020-10-08 VITALS — BP 156/105 | HR 96 | Temp 99.0°F | Resp 16 | Ht 63.0 in | Wt 157.8 lb

## 2020-10-08 DIAGNOSIS — R5383 Other fatigue: Secondary | ICD-10-CM | POA: Diagnosis not present

## 2020-10-08 DIAGNOSIS — R110 Nausea: Secondary | ICD-10-CM

## 2020-10-08 DIAGNOSIS — K311 Adult hypertrophic pyloric stenosis: Secondary | ICD-10-CM | POA: Diagnosis not present

## 2020-10-08 DIAGNOSIS — Z8249 Family history of ischemic heart disease and other diseases of the circulatory system: Secondary | ICD-10-CM

## 2020-10-08 DIAGNOSIS — C169 Malignant neoplasm of stomach, unspecified: Secondary | ICD-10-CM

## 2020-10-08 DIAGNOSIS — Z833 Family history of diabetes mellitus: Secondary | ICD-10-CM

## 2020-10-08 DIAGNOSIS — Z808 Family history of malignant neoplasm of other organs or systems: Secondary | ICD-10-CM | POA: Diagnosis not present

## 2020-10-08 DIAGNOSIS — D649 Anemia, unspecified: Secondary | ICD-10-CM | POA: Diagnosis not present

## 2020-10-08 DIAGNOSIS — Z7689 Persons encountering health services in other specified circumstances: Secondary | ICD-10-CM | POA: Diagnosis not present

## 2020-10-08 DIAGNOSIS — Z6826 Body mass index (BMI) 26.0-26.9, adult: Secondary | ICD-10-CM | POA: Diagnosis not present

## 2020-10-08 LAB — HEPATIC FUNCTION PANEL
ALT: 105 — AB (ref 10–40)
AST: 44 — AB (ref 14–40)
Alkaline Phosphatase: 145 — AB (ref 25–125)
Bilirubin, Total: 0.7

## 2020-10-08 LAB — COMPREHENSIVE METABOLIC PANEL
Albumin: 4.1 (ref 3.5–5.0)
Calcium: 9 (ref 8.7–10.7)

## 2020-10-08 LAB — CBC AND DIFFERENTIAL
HCT: 37 — AB (ref 41–53)
Hemoglobin: 12.2 — AB (ref 13.5–17.5)
Neutrophils Absolute: 9.31
Platelets: 370 (ref 150–399)
WBC: 13.3

## 2020-10-08 LAB — BASIC METABOLIC PANEL
BUN: 19 (ref 4–21)
CO2: 24 — AB (ref 13–22)
Chloride: 97 — AB (ref 99–108)
Creatinine: 1 (ref 0.6–1.3)
Glucose: 112
Potassium: 3.8 (ref 3.4–5.3)
Sodium: 130 — AB (ref 137–147)

## 2020-10-08 LAB — CBC: RBC: 4.19 (ref 3.87–5.11)

## 2020-10-08 NOTE — Progress Notes (Signed)
[facility]  [address] [place],  [place]  [address] [phone]  Clinic Day:  10/08/2020  Referring physician: Burney Gauze, MD   HISTORY OF PRESENT ILLNESS:  The patient is a 65 y.o. male  who I was asked to consult upon for gastric cancer.  His history dates back approximately 6 weeks ago when he recalls having issues with abdominal bloating, vomiting, and burping.  He claims to have had reflux disease for years.  As these symptoms became severe and unrelenting, he came into the emergency room in late April 2022 for further evaluation.  While there, scans were done, which showed a pyloric mass and radiographic signs of gastric outlet obstruction.  An EGD was done approximately a week later, which showed his pyloic mass to be adenocarcinoma.  He was eventually transferred to [facility], with the goal being to undergo a distal gastrectomy to remove the mass in question. Unfortunately, as the mass was tethered to his pancreas, this surgery was aborted.  He ended up undergoing a gastrojejunostomy, with a jejunostomy tube being placed.  He comes in today to discuss what next to consider for his disease management.  Of note, initial scans did not show evidence of any metastatic disease.  Intraoperatively, there was no obvious evidence of metastatic disease.    PAST MEDICAL HISTORY:   Past Medical History:  Diagnosis Date  . COPD (chronic obstructive pulmonary disease) ([place])   . GERD without esophagitis 01/04/2020   [facility]    PAST SURGICAL HISTORY:   Past Surgical History:  Procedure Laterality Date  . APPENDECTOMY    . GASTROJEJUNOSTOMY  09/20/2020   Procedure: GASTROJEJUNOSTOMY;  Surgeon: Dwan Bolt, MD;  Location: [facility];  Service: General;;  . HERNIA REPAIR     age 26 or 2   . IR IMAGING GUIDED PORT INSERTION  10/01/2020  . JEJUNOSTOMY N/A 09/20/2020   Procedure: FEEDING JEJUNOSTOMY;  Surgeon: Dwan Bolt, MD;   Location: [facility];  Service: General;  Laterality: N/A;    CURRENT MEDICATIONS:   Current Outpatient Medications  Medication Sig Dispense Refill  . cloNIDine (CATAPRES - DOSED IN MG/24 HR) 0.3 mg/24hr patch Place 1 patch (0.3 mg total) onto the skin every Friday. 4 patch 1  . feeding supplement (ENSURE ENLIVE / ENSURE PLUS) LIQD Take 237 mLs by mouth 2 (two) times daily between meals. 237 mL 12  . Nutritional Supplements (FEEDING SUPPLEMENT, OSMOLITE 1.5 CAL,) LIQD Place 1,200 mLs into feeding tube daily.  0  . Nutritional Supplements (FEEDING SUPPLEMENT, PROSOURCE TF,) liquid Place 45 mLs into feeding tube 3 (three) times daily.    . ondansetron (ZOFRAN ODT) 4 MG disintegrating tablet Take 1 tablet (4 mg total) by mouth every 8 (eight) hours as needed for nausea or vomiting. 20 tablet 0  . pantoprazole (PROTONIX) 40 MG tablet Take 40 mg by mouth at bedtime.    . rosuvastatin (CRESTOR) 10 MG tablet Take 10 mg by mouth every morning.    . Water For Irrigation, Sterile (FREE WATER) SOLN Place 150 mLs into feeding tube every 4 (four) hours.     No current facility-administered medications for this visit.    ALLERGIES:  No Known Allergies  FAMILY HISTORY:   Family History  Problem Relation Age of Onset  . Diabetes Sister   . Hypertension Brother   . Brain cancer Father   . Cancer Neg Hx   . Stroke Neg Hx   .  Heart attack Neg Hx   His father died from primary brain cancer.  His mother had multiple sclerosis.  His maternal grandfather and maternal uncle both had unspecified leukemias.    SOCIAL HISTORY:  The patient was born in [place].  He lives in town with his wife of 27 years.  He has 2 children and 2 grandchildren.  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  He smoked 2 packs of cigarettes daily for 40 years before quitting 2 years ago.  He did drink  a 6-pack of beer daily x 30 years, but quit drinking 2 years ago.  REVIEW OF SYSTEMS:  Review of Systems   Constitutional: Positive for fatigue. Negative for fever and unexpected weight change.  Respiratory: Positive for cough. Negative for chest tightness, hemoptysis and shortness of breath.   Cardiovascular: Negative for chest pain and palpitations.  Gastrointestinal: Positive for nausea. Negative for abdominal distention, abdominal pain, blood in stool, constipation, diarrhea and vomiting.  Genitourinary: Negative for dysuria, frequency and hematuria.   Musculoskeletal: Negative for arthralgias, back pain and myalgias.  Skin: Negative for itching and rash.  Neurological: Negative for dizziness, headaches and light-headedness.  Psychiatric/Behavioral: Negative for depression and suicidal ideas. The patient is not nervous/anxious.      PHYSICAL EXAM:  Blood pressure (!) 156/105, pulse 96, temperature 99 F (37.2 C), resp. rate 16, height 5\' 3"  (1.6 m), weight 157 lb 12.8 oz (71.6 kg), SpO2 98 %. Wt Readings from Last 3 Encounters:  10/08/20 157 lb 12.8 oz (71.6 kg)  10/02/20 168 lb 3.4 oz (76.3 kg)  08/20/20 179 lb 8 oz (81.4 kg)   Body mass index is 27.95 kg/m. Performance status (ECOG): 1 - Symptomatic but completely ambulatory Physical Exam Constitutional:      Appearance: Normal appearance. He is not ill-appearing.  HENT:     Mouth/Throat:     Mouth: Mucous membranes are moist.     Pharynx: Oropharynx is clear. No oropharyngeal exudate or posterior oropharyngeal erythema.  Cardiovascular:     Rate and Rhythm: Normal rate and regular rhythm.     Heart sounds: No murmur heard. No friction rub. No gallop.   Pulmonary:     Effort: Pulmonary effort is normal. No respiratory distress.     Breath sounds: Normal breath sounds. No wheezing, rhonchi or rales.  Chest:  Breasts:     Right: No axillary adenopathy or supraclavicular adenopathy.     Left: No axillary adenopathy or supraclavicular adenopathy.    Abdominal:     General: Bowel sounds are normal. There is no distension.      Palpations: Abdomen is soft. There is no mass.     Tenderness: There is no abdominal tenderness.     Comments: No palpable abdominal masses. J-tube site appears healthy, with no signs of erythema or infection.    Musculoskeletal:        General: No swelling.     Right lower leg: No edema.     Left lower leg: No edema.  Lymphadenopathy:     Cervical: No cervical adenopathy.     Upper Body:     Right upper body: No supraclavicular or axillary adenopathy.     Left upper body: No supraclavicular or axillary adenopathy.     Lower Body: No right inguinal adenopathy. No left inguinal adenopathy.  Skin:    General: Skin is warm.     Coloration: Skin is not jaundiced.     Findings:  No lesion or rash.  Neurological:     General: No focal deficit present.     Mental Status: He is alert and oriented to person, place, and time. Mental status is at baseline.     Cranial Nerves: Cranial nerves are intact.  Psychiatric:        Mood and Affect: Mood normal.        Behavior: Behavior normal.        Thought Content: Thought content normal.    LABS:   CBC Latest Ref Rng & Units 10/08/2020 09/28/2020 09/24/2020  WBC - 13.3 14.7(H) 14.2(H)  Hemoglobin 13.5 - 17.5 12.2(A) 9.5(L) 9.6(L)  Hematocrit 41 - 53 37(A) 26.4(L) 29.6(L)  Platelets 150 - 399 370 262 214   CMP Latest Ref Rng & Units 10/08/2020 10/01/2020 09/29/2020  Glucose 70 - 99 mg/dL - 109(H) 117(H)  BUN 4 - 21 19 26(H) 25(H)  Creatinine 0.6 - 1.3 1.0 0.99 0.97  Sodium 137 - 147 130(A) 131(L) 130(L)  Potassium 3.4 - 5.3 3.8 4.1 4.1  Chloride 99 - 108 97(A) 94(L) 94(L)  CO2 13 - 22 24(A) 28 26  Calcium 8.7 - 10.7 9.0 8.5(L) 8.5(L)  Total Protein 6.5 - 8.1 g/dL - - -  Total Bilirubin 0.3 - 1.2 mg/dL - - -  Alkaline Phos 25 - 125 145(A) - -  AST 14 - 40 44(A) - -  ALT 10 - 40 105(A) - -    ASSESSMENT & PLAN:  A 65 y.o. male who I was asked to consult upon for appears to be at least stage IVA (T4b N0 M0) gastric adenocarcinoma.  In  clinic today, I made sure the patient and his wife understood that the surgery that was actually done was a gastrojejunostomy.  They were under the belief that a distal gastrectomy had been done.  Moving forward, the question is will this patient ever be eligible for surgery to remove the mass in question.  My hope is that some form of neoadjuvant therapy can be given to reduce his tumor burden, including shrinking it off of his pancreas, to where surgery could still be feasible.  His case will be discussed with thoracic surgery to get their input as to whether there is any chance for this to come to fruition.  If not, then chemotherapy/chemoradiation would be considered.  Before embarking on any treatment plan, I will have this gentleman undergo a PET just to ensure there is no occult metastatic disease, which would change his entire goals of care.  His PET scan will be scheduled as quickly as possible, with me seeing him a day later to go over the images and their implications.  The patient and his wife understand all the plans discussed today and are in agreement with them.  I do appreciate Dr Burney Gauze for his new consult.   Dawnette Mione Macarthur Critchley, MD

## 2020-10-16 DIAGNOSIS — Z87891 Personal history of nicotine dependence: Secondary | ICD-10-CM | POA: Diagnosis not present

## 2020-10-16 DIAGNOSIS — K219 Gastro-esophageal reflux disease without esophagitis: Secondary | ICD-10-CM | POA: Diagnosis not present

## 2020-10-16 DIAGNOSIS — E669 Obesity, unspecified: Secondary | ICD-10-CM | POA: Diagnosis not present

## 2020-10-16 DIAGNOSIS — C169 Malignant neoplasm of stomach, unspecified: Secondary | ICD-10-CM | POA: Diagnosis not present

## 2020-10-16 DIAGNOSIS — Z483 Aftercare following surgery for neoplasm: Secondary | ICD-10-CM | POA: Diagnosis not present

## 2020-10-16 DIAGNOSIS — Z6829 Body mass index (BMI) 29.0-29.9, adult: Secondary | ICD-10-CM | POA: Diagnosis not present

## 2020-10-16 DIAGNOSIS — E785 Hyperlipidemia, unspecified: Secondary | ICD-10-CM | POA: Diagnosis not present

## 2020-10-16 DIAGNOSIS — Z9181 History of falling: Secondary | ICD-10-CM | POA: Diagnosis not present

## 2020-10-16 DIAGNOSIS — C7989 Secondary malignant neoplasm of other specified sites: Secondary | ICD-10-CM | POA: Diagnosis not present

## 2020-10-16 DIAGNOSIS — D630 Anemia in neoplastic disease: Secondary | ICD-10-CM | POA: Diagnosis not present

## 2020-10-16 DIAGNOSIS — K269 Duodenal ulcer, unspecified as acute or chronic, without hemorrhage or perforation: Secondary | ICD-10-CM | POA: Diagnosis not present

## 2020-10-16 DIAGNOSIS — I10 Essential (primary) hypertension: Secondary | ICD-10-CM | POA: Diagnosis not present

## 2020-10-16 DIAGNOSIS — Z434 Encounter for attention to other artificial openings of digestive tract: Secondary | ICD-10-CM | POA: Diagnosis not present

## 2020-10-16 DIAGNOSIS — J449 Chronic obstructive pulmonary disease, unspecified: Secondary | ICD-10-CM | POA: Diagnosis not present

## 2020-10-16 DIAGNOSIS — E871 Hypo-osmolality and hyponatremia: Secondary | ICD-10-CM | POA: Diagnosis not present

## 2020-10-17 NOTE — Progress Notes (Signed)
[facility]  [address] [place],  [place]  [address] [phone]  Clinic Day:  10/21/2020  Referring physician: Burney Gauze, MD  This document serves as a record of services personally performed by Dequincy Macarthur Critchley, MD. It was created on their behalf by [facility] E, a trained medical scribe. The creation of this record is based on the scribe's personal observations and the provider's statements to them.  HISTORY OF PRESENT ILLNESS:  The patient is a 65 y.o. male who I recently began seeing for locally advanced gastric cancer.  Per recent evaluation, his disease abutted his pancreas to where definitive surgery was aborted.   He ended up undergoing a gastrojejunostomy to avoid impending gastric outlet obstruction.  He comes in today to go over his PET scan to determine if he has occult disease metastasis.  Since his last visit, the patient has been doing fine.  He denies having any abdominal pain or other GI symptoms which concern him disease-related complications.    PHYSICAL EXAM:  Blood pressure (!) 176/108, pulse 97, temperature 98.2 F (36.8 C), resp. rate 16, height _0  (1.6 m), weight 153 lb 11.2 oz (69.7 kg), SpO2 95 %. Wt Readings from Last 3 Encounters:  10/21/20 153 lb 11.2 oz (69.7 kg)  10/08/20 157 lb 12.8 oz (71.6 kg)  10/02/20 168 lb 3.4 oz (76.3 kg)   Body mass index is 27.23 kg/m. Performance status (ECOG): 1 - Symptomatic but completely ambulatory Physical Exam Constitutional:      Appearance: Normal appearance. He is not ill-appearing.  HENT:     Mouth/Throat:     Mouth: Mucous membranes are moist.     Pharynx: Oropharynx is clear. No oropharyngeal exudate or posterior oropharyngeal erythema.  Cardiovascular:     Rate and Rhythm: Normal rate and regular rhythm.     Heart sounds: No murmur heard. No friction rub. No gallop.   Pulmonary:     Effort: Pulmonary effort is normal. No respiratory distress.     Breath sounds:  Normal breath sounds. No wheezing, rhonchi or rales.  Chest:  Breasts:     Right: No axillary adenopathy or supraclavicular adenopathy.     Left: No axillary adenopathy or supraclavicular adenopathy.    Abdominal:     General: Bowel sounds are normal. There is no distension.     Palpations: Abdomen is soft. There is no mass.     Tenderness: There is no abdominal tenderness.     Comments: No palpable abdominal masses. J-tube site appears healthy, with no signs of erythema or infection.    Musculoskeletal:        General: No swelling.     Right lower leg: No edema.     Left lower leg: No edema.  Lymphadenopathy:     Cervical: No cervical adenopathy.     Upper Body:     Right upper body: No supraclavicular or axillary adenopathy.     Left upper body: No supraclavicular or axillary adenopathy.     Lower Body: No right inguinal adenopathy. No left inguinal adenopathy.  Skin:    General: Skin is warm.     Coloration: Skin is not jaundiced.     Findings: No lesion or rash.  Neurological:     General: No focal deficit present.     Mental Status: He is alert and oriented to person, place, and time. Mental status is at baseline.     Cranial Nerves: Cranial nerves are intact.  Psychiatric:        Mood and Affect: Mood normal.        Behavior: Behavior normal.        Thought Content: Thought content normal.   SCANS:  His PET scan revealed the following: FINDINGS: Mediastinal blood pool activity: SUV max 2.5  Liver activity: SUV max NA  NECK: No hypermetabolic lymph nodes in the neck.  Incidental CT findings: Right internal jugular Port-A-Cath terminates at the cavoatrial junction.  CHEST: No enlarged or hypermetabolic axillary, mediastinal or hilar lymph nodes. No hypermetabolic pulmonary findings.  Incidental CT findings: Coronary atherosclerosis. Atherosclerotic nonaneurysmal thoracic aorta. No significant pulmonary nodules. Mild gynecomastia, asymmetric to the  right.  ABDOMEN/PELVIS:  Intensely hypermetabolic gastric pyloric soft tissue mass measuring approximately 4.5 x 4.0 cm with max SUV 12.8 (series 3/image 164), with probable direct invasion of the pancreatic neck (series 3/image 165).  Intense hypermetabolism throughout the periphery of the moderately enlarged prostate, left greater than right, with representative max SUV 12.8 in the left prostatic peripheral zone. No discrete mass correlate on the noncontrast CT images.  No abnormal hypermetabolic activity within the liver, pancreas, adrenal glands, or spleen. No hypermetabolic lymph nodes in the abdomen or pelvis.  Incidental CT findings: Expected postsurgical changes from gastrojejunostomy. Stomach is decompressed. Granulomatous posterior right liver calcification. Percutaneous jejunostomy tube in the ventral left abdomen terminates within a left abdominal small bowel loop. Atherosclerotic nonaneurysmal abdominal aorta.  SKELETON: No focal hypermetabolic activity to suggest skeletal metastasis.  Incidental CT findings: none  IMPRESSION: 1. Intensely hypermetabolic gastric pyloric mass, compatible with known primary gastric malignancy. This mass appears to directly invade the pancreatic neck. 2. No evidence of hypermetabolic metastatic disease. 3. Nonspecific intense hypermetabolism throughout the periphery of the moderately enlarged prostate, left greater than right. Prostatic malignancy cannot be excluded. Suggest correlation with PSA. Urology consult may be obtained as clinically warranted. 4. Chronic findings include: Aortic Atherosclerosis (ICD10-I70.0). Coronary atherosclerosis.  ASSESSMENT & PLAN:  A 65 y.o. male who appears to only have stage IVA (T4b N0 M0) gastric adenocarcinoma.  In clinic today, I went over his PET scan images with him, for which he could see he has no evidence of metastatic disease.  As I still wish to push for curative intent that could still  incorporate surgical resection, the plan will be to implement 4 cycles of neoadjuvant FLOT (infusional 5-FU/ leucovorin/oxaliplatin/docetaxel).  Each cycle will be repeated every 2 weeks.  The patient was made aware of the side effects that can go along with this regimen, including diarrhea, cold intolerance, peripheral neuropathy, alopecia and cytopenias.  His tumor will be checked for Her2; if positive, I would incorporate Herceptin as well.  As he already has his port placed, I will arrange for his 1st cycle of FLOT to be given on Thursday, June 9th.  I will see him back 2 weeks later before he heads into his 2nd cycle of neoadjuvant FLOT chemotherapy.  The patient and his wife understand all the plans discussed today and are in agreement with them.   I, Rita Ohara, am acting as scribe for [name], MD    I have reviewed this report as typed by the medical scribe, and it is complete and accurate.  Dequincy Macarthur Critchley, MD

## 2020-10-18 DIAGNOSIS — I700 Atherosclerosis of aorta: Secondary | ICD-10-CM | POA: Diagnosis not present

## 2020-10-18 DIAGNOSIS — I2510 Atherosclerotic heart disease of native coronary artery without angina pectoris: Secondary | ICD-10-CM | POA: Diagnosis not present

## 2020-10-18 DIAGNOSIS — N400 Enlarged prostate without lower urinary tract symptoms: Secondary | ICD-10-CM | POA: Diagnosis not present

## 2020-10-18 DIAGNOSIS — C164 Malignant neoplasm of pylorus: Secondary | ICD-10-CM | POA: Diagnosis not present

## 2020-10-21 ENCOUNTER — Other Ambulatory Visit: Payer: Self-pay | Admitting: Oncology

## 2020-10-21 ENCOUNTER — Other Ambulatory Visit: Payer: Self-pay

## 2020-10-21 ENCOUNTER — Telehealth: Payer: Self-pay | Admitting: Oncology

## 2020-10-21 ENCOUNTER — Inpatient Hospital Stay: Payer: PPO | Attending: Oncology | Admitting: Oncology

## 2020-10-21 VITALS — BP 176/108 | HR 97 | Temp 98.2°F | Resp 16 | Ht 63.0 in | Wt 153.7 lb

## 2020-10-21 DIAGNOSIS — R5383 Other fatigue: Secondary | ICD-10-CM | POA: Insufficient documentation

## 2020-10-21 DIAGNOSIS — C169 Malignant neoplasm of stomach, unspecified: Secondary | ICD-10-CM | POA: Diagnosis not present

## 2020-10-21 DIAGNOSIS — I10 Essential (primary) hypertension: Secondary | ICD-10-CM | POA: Insufficient documentation

## 2020-10-21 DIAGNOSIS — K5900 Constipation, unspecified: Secondary | ICD-10-CM | POA: Insufficient documentation

## 2020-10-21 DIAGNOSIS — Z79899 Other long term (current) drug therapy: Secondary | ICD-10-CM | POA: Insufficient documentation

## 2020-10-21 DIAGNOSIS — Z5111 Encounter for antineoplastic chemotherapy: Secondary | ICD-10-CM | POA: Insufficient documentation

## 2020-10-21 DIAGNOSIS — R112 Nausea with vomiting, unspecified: Secondary | ICD-10-CM | POA: Insufficient documentation

## 2020-10-21 DIAGNOSIS — R197 Diarrhea, unspecified: Secondary | ICD-10-CM | POA: Insufficient documentation

## 2020-10-21 DIAGNOSIS — G629 Polyneuropathy, unspecified: Secondary | ICD-10-CM | POA: Insufficient documentation

## 2020-10-21 DIAGNOSIS — Z7289 Other problems related to lifestyle: Secondary | ICD-10-CM | POA: Insufficient documentation

## 2020-10-21 DIAGNOSIS — Z5189 Encounter for other specified aftercare: Secondary | ICD-10-CM | POA: Insufficient documentation

## 2020-10-21 NOTE — Progress Notes (Signed)
START OFF PATHWAY REGIMEN - Gastroesophageal   [id number]:Docetaxel + Oxaliplatin + Leucovorin + Fluorouracil CIV q14 Days (FLOT):   A cycle is every 14 days:     Docetaxel      Oxaliplatin      Leucovorin      Fluorouracil   **Always confirm dose/schedule in your pharmacy ordering system**  **Administration Notes: This is considered a standard for neoadjuvant therapy for stomach cancer  Patient Characteristics: Gastric, Adenocarcinoma, Preoperative or Nonsurgical Candidate (Clinical Staging), cT3 or Higher or cN+, Unresectable/Nonsurgical Candidate (Any cT), Not a Radiation Candidate, HER2 Negative/Unknown, PD?L1 Expression CPS < 5/Negative/Unknown, MSS/pMMR or  MSI Unknown Histology: Adenocarcinoma Disease Classification: Gastric Therapeutic Status: Preoperative or Nonsurgical Candidate (Clinical Staging) AJCC N Category: cN0 AJCC M Category: cM0 AJCC 8 Stage Grouping: IVA AJCC T Category: cT4b Patient Characteristics: Not a Radiation Candidate HER2 Status: Awaiting Test Results PD-L1 Expression Status: Awaiting Test Results Microsatellite/Mismatch Repair Status: Unknown Intent of Therapy: Curative Intent, Discussed with Patient

## 2020-10-21 NOTE — Telephone Encounter (Signed)
Per 6/6 LOS/Staff Msg, patient scheduled 1st round of Chemo -  Labs, Patient Education.  Gave patient Appt Summary

## 2020-10-22 ENCOUNTER — Inpatient Hospital Stay: Payer: PPO

## 2020-10-22 ENCOUNTER — Encounter: Payer: Self-pay | Admitting: Hematology and Oncology

## 2020-10-22 ENCOUNTER — Encounter: Payer: Self-pay | Admitting: Oncology

## 2020-10-22 ENCOUNTER — Inpatient Hospital Stay (INDEPENDENT_AMBULATORY_CARE_PROVIDER_SITE_OTHER): Payer: PPO | Admitting: Hematology and Oncology

## 2020-10-22 DIAGNOSIS — C169 Malignant neoplasm of stomach, unspecified: Secondary | ICD-10-CM

## 2020-10-22 DIAGNOSIS — D649 Anemia, unspecified: Secondary | ICD-10-CM | POA: Diagnosis not present

## 2020-10-22 LAB — HEPATIC FUNCTION PANEL
ALT: 42 — AB (ref 10–40)
AST: 35 (ref 14–40)
Alkaline Phosphatase: 139 — AB (ref 25–125)
Bilirubin, Total: 0.6

## 2020-10-22 LAB — BASIC METABOLIC PANEL
BUN: 18 (ref 4–21)
CO2: 20 (ref 13–22)
Chloride: 97 — AB (ref 99–108)
Creatinine: 0.7 (ref 0.6–1.3)
Glucose: 120
Potassium: 3.6 (ref 3.4–5.3)
Sodium: 131 — AB (ref 137–147)

## 2020-10-22 LAB — CBC AND DIFFERENTIAL
HCT: 33 — AB (ref 41–53)
Hemoglobin: 11.5 — AB (ref 13.5–17.5)
Neutrophils Absolute: 7.32
Platelets: 418 — AB (ref 150–399)
WBC: 11.8

## 2020-10-22 LAB — CBC: RBC: 3.83 — AB (ref 3.87–5.11)

## 2020-10-22 LAB — COMPREHENSIVE METABOLIC PANEL
Albumin: 4 (ref 3.5–5.0)
Calcium: 9.2 (ref 8.7–10.7)

## 2020-10-22 MED ORDER — DEXAMETHASONE 4 MG PO TABS: ORAL_TABLET | ORAL | 0 refills | Status: DC

## 2020-10-22 MED ORDER — ONDANSETRON HCL 4 MG PO TABS: 4.0000 mg | ORAL_TABLET | ORAL | 3 refills | Status: DC | PRN

## 2020-10-22 MED ORDER — PROCHLORPERAZINE MALEATE 10 MG PO TABS: 10.0000 mg | ORAL_TABLET | Freq: Four times a day (QID) | ORAL | 3 refills | Status: AC | PRN

## 2020-10-22 NOTE — Progress Notes (Addendum)
The patient is a 65 year old male with newly diagnosed gastric adenocarcinoma.  Patient presents to clinic today for chemotherapy education and palliative care consult.  We will start docetaxel, oxaliplatin, leucovorin, fluorouracil.  We will send in prescriptions for dexamethasone, prochlorperazine and ondansetron.  The patient verbalizes understanding of and agreement to the plan as discussed today.  Provided general information including the following: 1.  Date of education: 10/22/2020 2.  Physician name: Dr. Bobby Rumpf 3.  Diagnosis: Gastric adenocarcinoma 4.  Stage: stage IVA 5.  Curative  6.  Chemotherapy plan including drugs and how often: docetaxel, oxaliplatin, leucovorin, fluorouracil IV every 2 weeks x 4 cycles 7.  Start date: 10-24-2020 8.  Other referrals: None at this time 9.  The patient is to call our office with any questions or concerns.  Our office number [phone], if after hours or on the weekend, call the same number and wait for the answering service.  There is always an oncologist on call 10.  Medications prescribed: dexamethasone, ondansetron, prochlorperazine 11.  The patient has verbalized understanding of the treatment plan and has no barriers to adherence or understanding.  Obtained signed consent from patient.  Discussed symptoms including 1.  Low blood counts including red blood cells, white blood cells and platelets. 2. Infection including to avoid large crowds, wash hands frequently, and stay away from people who were sick.  If fever develops of 100.4 or higher, call our office. 3.  Mucositis-given instructions on mouth rinse (baking soda and salt mixture).  Keep mouth clean.  Use soft bristle toothbrush.  If mouth sores develop, call our clinic. 4.  Nausea/vomiting-gave prescriptions for ondansetron 4 mg every 4 hours as needed for nausea, may take around the clock if persistent.  Compazine 10 mg every 6 hours, may take around the clock if persistent. 5.   Diarrhea-use over-the-counter Imodium.  Call clinic if not controlled. 6.  Constipation-use senna, 1 to 2 tablets twice a day.  If no BM in 2 to 3 days call the clinic. 7.  Loss of appetite-try to eat small meals every 2-3 hours.  Call clinic if not eating. 8.  Taste changes-zinc 500 mg daily.  If becomes severe call clinic. 9.  Alcoholic beverages. 10.  Drink 2 to 3 quarts of water per day. 11.  Peripheral neuropathy-patient to call if numbness or tingling in hands or feet is persistent  Gave information on the supportive care team and how to contact them regarding services.  Discussed advanced directives.  The patient does not have their advanced directives but will look at the copy provided in their notebook and will call with any questions. [employer] Social worker Advanced directives  Answered questions to patient satisfaction.  Patient is to call with any further questions or concerns.   Dayton Scrape, FNP- [facility]

## 2020-10-22 NOTE — Addendum Note (Signed)
Addended by: Dayton Scrape on: 10/22/2020 03:33 PM   Modules accepted: Orders, Level of Service

## 2020-10-23 ENCOUNTER — Other Ambulatory Visit: Payer: Self-pay | Admitting: Pharmacist

## 2020-10-24 ENCOUNTER — Inpatient Hospital Stay: Payer: PPO

## 2020-10-24 ENCOUNTER — Encounter: Payer: Self-pay | Admitting: Oncology

## 2020-10-24 ENCOUNTER — Other Ambulatory Visit: Payer: Self-pay

## 2020-10-24 VITALS — BP 174/99 | HR 67 | Temp 98.1°F | Resp 18 | Ht 63.0 in | Wt 151.0 lb

## 2020-10-24 DIAGNOSIS — G629 Polyneuropathy, unspecified: Secondary | ICD-10-CM | POA: Diagnosis not present

## 2020-10-24 DIAGNOSIS — C169 Malignant neoplasm of stomach, unspecified: Secondary | ICD-10-CM | POA: Diagnosis not present

## 2020-10-24 DIAGNOSIS — K5900 Constipation, unspecified: Secondary | ICD-10-CM | POA: Diagnosis not present

## 2020-10-24 DIAGNOSIS — R5383 Other fatigue: Secondary | ICD-10-CM | POA: Diagnosis not present

## 2020-10-24 DIAGNOSIS — Z7289 Other problems related to lifestyle: Secondary | ICD-10-CM | POA: Diagnosis not present

## 2020-10-24 DIAGNOSIS — R197 Diarrhea, unspecified: Secondary | ICD-10-CM | POA: Diagnosis not present

## 2020-10-24 DIAGNOSIS — R112 Nausea with vomiting, unspecified: Secondary | ICD-10-CM | POA: Diagnosis not present

## 2020-10-24 DIAGNOSIS — I10 Essential (primary) hypertension: Secondary | ICD-10-CM | POA: Diagnosis not present

## 2020-10-24 DIAGNOSIS — Z5189 Encounter for other specified aftercare: Secondary | ICD-10-CM | POA: Diagnosis not present

## 2020-10-24 DIAGNOSIS — Z79899 Other long term (current) drug therapy: Secondary | ICD-10-CM | POA: Diagnosis not present

## 2020-10-24 DIAGNOSIS — Z5111 Encounter for antineoplastic chemotherapy: Secondary | ICD-10-CM | POA: Diagnosis not present

## 2020-10-24 MED ORDER — PALONOSETRON HCL INJECTION 0.25 MG/5ML
0.2500 mg | Freq: Once | INTRAVENOUS | Status: AC
Start: 2020-10-24 — End: 2020-10-24
  Administered 2020-10-24: 0.25 mg via INTRAVENOUS

## 2020-10-24 MED ORDER — DEXTROSE 5 % IV SOLN
Freq: Once | INTRAVENOUS | Status: AC
  Filled 2020-10-24: qty 250

## 2020-10-24 MED ORDER — SODIUM CHLORIDE 0.9 % IV SOLN
50.0000 mg/m2 | Freq: Once | INTRAVENOUS | Status: AC
  Administered 2020-10-24: 90 mg via INTRAVENOUS
  Filled 2020-10-24: qty 9

## 2020-10-24 MED ORDER — HEPARIN SOD (PORK) LOCK FLUSH 100 UNIT/ML IV SOLN
500.0000 [IU] | Freq: Once | INTRAVENOUS | Status: DC | PRN
  Filled 2020-10-24: qty 5

## 2020-10-24 MED ORDER — PALONOSETRON HCL INJECTION 0.25 MG/5ML
INTRAVENOUS | Status: AC
  Filled 2020-10-24: qty 5

## 2020-10-24 MED ORDER — SODIUM CHLORIDE 0.9 % IV SOLN
2600.0000 mg/m2 | INTRAVENOUS | Status: DC
  Administered 2020-10-24: 4600 mg via INTRAVENOUS
  Filled 2020-10-24: qty 92

## 2020-10-24 MED ORDER — LEUCOVORIN CALCIUM INJECTION 350 MG
199.0000 mg/m2 | Freq: Once | INTRAVENOUS | Status: AC
  Administered 2020-10-24: 350 mg via INTRAVENOUS
  Filled 2020-10-24: qty 17.5

## 2020-10-24 MED ORDER — SODIUM CHLORIDE 0.9% FLUSH
10.0000 mL | INTRAVENOUS | Status: DC | PRN
  Filled 2020-10-24: qty 10

## 2020-10-24 MED ORDER — DEXAMETHASONE SODIUM PHOSPHATE 100 MG/10ML IJ SOLN
10.0000 mg | Freq: Once | INTRAMUSCULAR | Status: AC
  Administered 2020-10-24: 10 mg via INTRAVENOUS
  Filled 2020-10-24: qty 10

## 2020-10-24 MED ORDER — OXALIPLATIN CHEMO INJECTION 100 MG/20ML
85.0000 mg/m2 | Freq: Once | INTRAVENOUS | Status: AC
  Administered 2020-10-24: 150 mg via INTRAVENOUS
  Filled 2020-10-24: qty 20

## 2020-10-24 NOTE — Patient Instructions (Signed)
Fluorouracil, 5-FU injection What is this medicine? FLUOROURACIL, 5-FU (flure oh YOOR a sil) is a chemotherapy drug. It slows the growth of cancer cells. This medicine is used to treat many types of cancer like breast cancer, colon or rectal cancer, pancreatic cancer, and stomach cancer. This medicine may be used for other purposes; ask your health care provider or pharmacist if you have questions. COMMON BRAND NAME(S): Adrucil What should I tell my health care provider before I take this medicine? They need to know if you have any of these conditions: blood disorders dihydropyrimidine dehydrogenase (DPD) deficiency infection (especially a virus infection such as chickenpox, cold sores, or herpes) kidney disease liver disease malnourished, poor nutrition recent or ongoing radiation therapy an unusual or allergic reaction to fluorouracil, other chemotherapy, other medicines, foods, dyes, or preservatives pregnant or trying to get pregnant breast-feeding How should I use this medicine? This drug is given as an infusion or injection into a vein. It is administered in a hospital or clinic by a specially trained health care professional. Talk to your pediatrician regarding the use of this medicine in children. Special care may be needed. Overdosage: If you think you have taken too much of this medicine contact a poison control center or emergency room at once. NOTE: This medicine is only for you. Do not share this medicine with others. What if I miss a dose? It is important not to miss your dose. Call your doctor or health care professional if you are unable to keep an appointment. What may interact with this medicine? Do not take this medicine with any of the following medications: live virus vaccines This medicine may also interact with the following medications: medicines that treat or prevent blood clots like warfarin, enoxaparin, and dalteparin This list may not describe all possible  interactions. Give your health care provider a list of all the medicines, herbs, non-prescription drugs, or dietary supplements you use. Also tell them if you smoke, drink alcohol, or use illegal drugs. Some items may interact with your medicine. What should I watch for while using this medicine? Visit your doctor for checks on your progress. This drug may make you feel generally unwell. This is not uncommon, as chemotherapy can affect healthy cells as well as cancer cells. Report any side effects. Continue your course of treatment even though you feel ill unless your doctor tells you to stop. In some cases, you may be given additional medicines to help with side effects. Follow all directions for their use. Call your doctor or health care professional for advice if you get a fever, chills or sore throat, or other symptoms of a cold or flu. Do not treat yourself. This drug decreases your body's ability to fight infections. Try to avoid being around people who are sick. This medicine may increase your risk to bruise or bleed. Call your doctor or health care professional if you notice any unusual bleeding. Be careful brushing and flossing your teeth or using a toothpick because you may get an infection or bleed more easily. If you have any dental work done, tell your dentist you are receiving this medicine. Avoid taking products that contain aspirin, acetaminophen, ibuprofen, naproxen, or ketoprofen unless instructed by your doctor. These medicines may hide a fever. Do not become pregnant while taking this medicine. Women should inform their doctor if they wish to become pregnant or think they might be pregnant. There is a potential for serious side effects to an unborn child. Talk to your health  care professional or pharmacist for more information. Do not breast-feed an infant while taking this medicine. Men should inform their doctor if they wish to father a child. This medicine may lower sperm counts. Do  not treat diarrhea with over the counter products. Contact your doctor if you have diarrhea that lasts more than 2 days or if it is severe and watery. This medicine can make you more sensitive to the sun. Keep out of the sun. If you cannot avoid being in the sun, wear protective clothing and use sunscreen. Do not use sun lamps or tanning beds/booths. What side effects may I notice from receiving this medicine? Side effects that you should report to your doctor or health care professional as soon as possible: allergic reactions like skin rash, itching or hives, swelling of the face, lips, or tongue low blood counts - this medicine may decrease the number of white blood cells, red blood cells and platelets. You may be at increased risk for infections and bleeding. signs of infection - fever or chills, cough, sore throat, pain or difficulty passing urine signs of decreased platelets or bleeding - bruising, pinpoint red spots on the skin, black, tarry stools, blood in the urine signs of decreased red blood cells - unusually weak or tired, fainting spells, lightheadedness breathing problems changes in vision chest pain mouth sores nausea and vomiting pain, swelling, redness at site where injected pain, tingling, numbness in the hands or feet redness, swelling, or sores on hands or feet stomach pain unusual bleeding Side effects that usually do not require medical attention (report to your doctor or health care professional if they continue or are bothersome): changes in finger or toe nails diarrhea dry or itchy skin hair loss headache loss of appetite sensitivity of eyes to the light stomach upset unusually teary eyes This list may not describe all possible side effects. Call your doctor for medical advice about side effects. You may report side effects to FDA at 1-800-FDA-1088. Where should I keep my medicine? This drug is given in a hospital or clinic and will not be stored at  home. NOTE: This sheet is a summary. It may not cover all possible information. If you have questions about this medicine, talk to your doctor, pharmacist, or health care provider.  2021 Elsevier/[employer] Standard (2019-04-04 15:00:03) Leucovorin injection What is this medicine? LEUCOVORIN (loo koe VOR in) is used to prevent or treat the harmful effects of some medicines. This medicine is used to treat anemia caused by a low amount of folic acid in the body. It is also used with 5-fluorouracil (5-FU) to treat colon cancer. This medicine may be used for other purposes; ask your health care provider or pharmacist if you have questions. What should I tell my health care provider before I take this medicine? They need to know if you have any of these conditions: anemia from low levels of vitamin B-12 in the blood an unusual or allergic reaction to leucovorin, folic acid, other medicines, foods, dyes, or preservatives pregnant or trying to get pregnant breast-feeding How should I use this medicine? This medicine is for injection into a muscle or into a vein. It is given by a health care professional in a hospital or clinic setting. Talk to your pediatrician regarding the use of this medicine in children. Special care may be needed. Overdosage: If you think you have taken too much of this medicine contact a poison control center or emergency room at once. NOTE: This medicine is only  for you. Do not share this medicine with others. What if I miss a dose? This does not apply. What may interact with this medicine? capecitabine fluorouracil phenobarbital phenytoin primidone trimethoprim-sulfamethoxazole This list may not describe all possible interactions. Give your health care provider a list of all the medicines, herbs, non-prescription drugs, or dietary supplements you use. Also tell them if you smoke, drink alcohol, or use illegal drugs. Some items may interact with your medicine. What should I  watch for while using this medicine? Your condition will be monitored carefully while you are receiving this medicine. This medicine may increase the side effects of 5-fluorouracil, 5-FU. Tell your doctor or health care professional if you have diarrhea or mouth sores that do not get better or that get worse. What side effects may I notice from receiving this medicine? Side effects that you should report to your doctor or health care professional as soon as possible: allergic reactions like skin rash, itching or hives, swelling of the face, lips, or tongue breathing problems fever, infection mouth sores unusual bleeding or bruising unusually weak or tired Side effects that usually do not require medical attention (report to your doctor or health care professional if they continue or are bothersome): constipation or diarrhea loss of appetite nausea, vomiting This list may not describe all possible side effects. Call your doctor for medical advice about side effects. You may report side effects to FDA at 1-800-FDA-1088. Where should I keep my medicine? This drug is given in a hospital or clinic and will not be stored at home. NOTE: This sheet is a summary. It may not cover all possible information. If you have questions about this medicine, talk to your doctor, pharmacist, or health care provider.  2021 Elsevier/Gold Standard (2007-11-08 16:50:29) Oxaliplatin Injection What is this medicine? OXALIPLATIN (ox AL i PLA tin) is a chemotherapy drug. It targets fast dividing cells, like cancer cells, and causes these cells to die. This medicine is used to treat cancers of the colon and rectum, and many other cancers. This medicine may be used for other purposes; ask your health care provider or pharmacist if you have questions. COMMON BRAND NAME(S): Eloxatin What should I tell my health care provider before I take this medicine? They need to know if you have any of these conditions: heart  disease history of irregular heartbeat liver disease low blood counts, like white cells, platelets, or red blood cells lung or breathing disease, like asthma take medicines that treat or prevent blood clots tingling of the fingers or toes, or other nerve disorder an unusual or allergic reaction to oxaliplatin, other chemotherapy, other medicines, foods, dyes, or preservatives pregnant or trying to get pregnant breast-feeding How should I use this medicine? This drug is given as an infusion into a vein. It is administered in a hospital or clinic by a specially trained health care professional. Talk to your pediatrician regarding the use of this medicine in children. Special care may be needed. Overdosage: If you think you have taken too much of this medicine contact a poison control center or emergency room at once. NOTE: This medicine is only for you. Do not share this medicine with others. What if I miss a dose? It is important not to miss a dose. Call your doctor or health care professional if you are unable to keep an appointment. What may interact with this medicine? Do not take this medicine with any of the following medications: cisapride dronedarone pimozide thioridazine This medicine may also  interact with the following medications: aspirin and aspirin-like medicines certain medicines that treat or prevent blood clots like warfarin, apixaban, dabigatran, and rivaroxaban cisplatin cyclosporine diuretics medicines for infection like acyclovir, adefovir, amphotericin B, bacitracin, cidofovir, foscarnet, ganciclovir, gentamicin, pentamidine, vancomycin NSAIDs, medicines for pain and inflammation, like ibuprofen or naproxen other medicines that prolong the QT interval (an abnormal heart rhythm) pamidronate zoledronic acid This list may not describe all possible interactions. Give your health care provider a list of all the medicines, herbs, non-prescription drugs, or dietary  supplements you use. Also tell them if you smoke, drink alcohol, or use illegal drugs. Some items may interact with your medicine. What should I watch for while using this medicine? Your condition will be monitored carefully while you are receiving this medicine. You may need blood work done while you are taking this medicine. This medicine may make you feel generally unwell. This is not uncommon as chemotherapy can affect healthy cells as well as cancer cells. Report any side effects. Continue your course of treatment even though you feel ill unless your healthcare professional tells you to stop. This medicine can make you more sensitive to cold. Do not drink cold drinks or use ice. Cover exposed skin before coming in contact with cold temperatures or cold objects. When out in cold weather wear warm clothing and cover your mouth and nose to warm the air that goes into your lungs. Tell your doctor if you get sensitive to the cold. Do not become pregnant while taking this medicine or for 9 months after stopping it. Women should inform their health care professional if they wish to become pregnant or think they might be pregnant. Men should not father a child while taking this medicine and for 6 months after stopping it. There is potential for serious side effects to an unborn child. Talk to your health care professional for more information. Do not breast-feed a child while taking this medicine or for 3 months after stopping it. This medicine has caused ovarian failure in some women. This medicine may make it more difficult to get pregnant. Talk to your health care professional if you are concerned about your fertility. This medicine has caused decreased sperm counts in some men. This may make it more difficult to father a child. Talk to your health care professional if you are concerned about your fertility. This medicine may increase your risk of getting an infection. Call your health care professional  for advice if you get a fever, chills, or sore throat, or other symptoms of a cold or flu. Do not treat yourself. Try to avoid being around people who are sick. Avoid taking medicines that contain aspirin, acetaminophen, ibuprofen, naproxen, or ketoprofen unless instructed by your health care professional. These medicines may hide a fever. Be careful brushing or flossing your teeth or using a toothpick because you may get an infection or bleed more easily. If you have any dental work done, tell your dentist you are receiving this medicine. What side effects may I notice from receiving this medicine? Side effects that you should report to your doctor or health care professional as soon as possible: allergic reactions like skin rash, itching or hives, swelling of the face, lips, or tongue breathing problems cough low blood counts - this medicine may decrease the number of white blood cells, red blood cells, and platelets. You may be at increased risk for infections and bleeding nausea, vomiting pain, redness, or irritation at site where injected pain, tingling, numbness  in the hands or feet signs and symptoms of bleeding such as bloody or black, tarry stools; red or dark brown urine; spitting up blood or brown material that looks like coffee grounds; red spots on the skin; unusual bruising or bleeding from the eyes, gums, or nose signs and symptoms of a dangerous change in heartbeat or heart rhythm like chest pain; dizziness; fast, irregular heartbeat; palpitations; feeling faint or lightheaded; falls signs and symptoms of infection like fever; chills; cough; sore throat; pain or trouble passing urine signs and symptoms of liver injury like dark yellow or brown urine; general ill feeling or flu-like symptoms; light-colored stools; loss of appetite; nausea; right upper belly pain; unusually weak or tired; yellowing of the eyes or skin signs and symptoms of low red blood cells or anemia such as unusually  weak or tired; feeling faint or lightheaded; falls signs and symptoms of muscle injury like dark urine; trouble passing urine or change in the amount of urine; unusually weak or tired; muscle pain; back pain Side effects that usually do not require medical attention (report to your doctor or health care professional if they continue or are bothersome): changes in taste diarrhea gas hair loss loss of appetite mouth sores This list may not describe all possible side effects. Call your doctor for medical advice about side effects. You may report side effects to FDA at 1-800-FDA-1088. Where should I keep my medicine? This drug is given in a hospital or clinic and will not be stored at home. NOTE: This sheet is a summary. It may not cover all possible information. If you have questions about this medicine, talk to your doctor, pharmacist, or health care provider.  2021 Elsevier/Gold Standard (2018-09-21 12:20:35) Docetaxel injection What is this medicine? DOCETAXEL (doe se TAX el) is a chemotherapy drug. It targets fast dividing cells, like cancer cells, and causes these cells to die. This medicine is used to treat many types of cancers like breast cancer, certain stomach cancers, head and neck cancer, lung cancer, and prostate cancer. This medicine may be used for other purposes; ask your health care provider or pharmacist if you have questions. COMMON BRAND NAME(S): Docefrez, Taxotere What should I tell my health care provider before I take this medicine? They need to know if you have any of these conditions: infection (especially a virus infection such as chickenpox, cold sores, or herpes) liver disease low blood counts, like low white cell, platelet, or red cell counts an unusual or allergic reaction to docetaxel, polysorbate 80, other chemotherapy agents, other medicines, foods, dyes, or preservatives pregnant or trying to get pregnant breast-feeding How should I use this medicine? This  drug is given as an infusion into a vein. It is administered in a hospital or clinic by a specially trained health care professional. Talk to your pediatrician regarding the use of this medicine in children. Special care may be needed. Overdosage: If you think you have taken too much of this medicine contact a poison control center or emergency room at once. NOTE: This medicine is only for you. Do not share this medicine with others. What if I miss a dose? It is important not to miss your dose. Call your doctor or health care professional if you are unable to keep an appointment. What may interact with this medicine? Do not take this medicine with any of the following medications: live virus vaccines This medicine may also interact with the following medications: aprepitant certain antibiotics like erythromycin or clarithromycin certain antivirals  for HIV or hepatitis certain medicines for fungal infections like fluconazole, itraconazole, ketoconazole, posaconazole, or voriconazole cimetidine ciprofloxacin conivaptan cyclosporine dronedarone fluvoxamine grapefruit juice imatinib verapamil This list may not describe all possible interactions. Give your health care provider a list of all the medicines, herbs, non-prescription drugs, or dietary supplements you use. Also tell them if you smoke, drink alcohol, or use illegal drugs. Some items may interact with your medicine. What should I watch for while using this medicine? Your condition will be monitored carefully while you are receiving this medicine. You will need important blood work done while you are taking this medicine. Call your doctor or health care professional for advice if you get a fever, chills or sore throat, or other symptoms of a cold or flu. Do not treat yourself. This drug decreases your body's ability to fight infections. Try to avoid being around people who are sick. Some products may contain alcohol. Ask your health  care professional if this medicine contains alcohol. Be sure to tell all health care professionals you are taking this medicine. Certain medicines, like metronidazole and disulfiram, can cause an unpleasant reaction when taken with alcohol. The reaction includes flushing, headache, nausea, vomiting, sweating, and increased thirst. The reaction can last from 30 minutes to several hours. You may get drowsy or dizzy. Do not drive, use machinery, or do anything that needs mental alertness until you know how this medicine affects you. Do not stand or sit up quickly, especially if you are an older patient. This reduces the risk of dizzy or fainting spells. Alcohol may interfere with the effect of this medicine. Talk to your health care professional about your risk of cancer. You may be more at risk for certain types of cancer if you take this medicine. Do not become pregnant while taking this medicine or for 6 months after stopping it. Women should inform their doctor if they wish to become pregnant or think they might be pregnant. There is a potential for serious side effects to an unborn child. Talk to your health care professional or pharmacist for more information. Do not breast-feed an infant while taking this medicine or for 1 week after stopping it. Males who get this medicine must use a condom during sex with females who can get pregnant. If you get a woman pregnant, the baby could have birth defects. The baby could die before they are born. You will need to continue wearing a condom for 3 months after stopping the medicine. Tell your health care provider right away if your partner becomes pregnant while you are taking this medicine. This may interfere with the ability to father a child. You should talk to your doctor or health care professional if you are concerned about your fertility. What side effects may I notice from receiving this medicine? Side effects that you should report to your doctor or health  care professional as soon as possible: allergic reactions like skin rash, itching or hives, swelling of the face, lips, or tongue blurred vision breathing problems changes in vision low blood counts - This drug may decrease the number of white blood cells, red blood cells and platelets. You may be at increased risk for infections and bleeding. nausea and vomiting pain, redness or irritation at site where injected pain, tingling, numbness in the hands or feet redness, blistering, peeling, or loosening of the skin, including inside the mouth signs of decreased platelets or bleeding - bruising, pinpoint red spots on the skin, black, tarry stools, nosebleeds  signs of decreased red blood cells - unusually weak or tired, fainting spells, lightheadedness signs of infection - fever or chills, cough, sore throat, pain or difficulty passing urine swelling of the ankle, feet, hands Side effects that usually do not require medical attention (report to your doctor or health care professional if they continue or are bothersome): constipation diarrhea fingernail or toenail changes hair loss loss of appetite mouth sores muscle pain This list may not describe all possible side effects. Call your doctor for medical advice about side effects. You may report side effects to FDA at 1-800-FDA-1088. Where should I keep my medicine? This drug is given in a hospital or clinic and will not be stored at home. NOTE: This sheet is a summary. It may not cover all possible information. If you have questions about this medicine, talk to your doctor, pharmacist, or health care provider.  2021 Elsevier/Gold Standard (2019-04-03 19:50:31)

## 2020-10-25 ENCOUNTER — Telehealth: Payer: Self-pay

## 2020-10-25 ENCOUNTER — Inpatient Hospital Stay: Payer: PPO

## 2020-10-25 VITALS — BP 143/92 | HR 77 | Temp 98.0°F | Resp 18 | Ht 63.0 in | Wt 151.0 lb

## 2020-10-25 DIAGNOSIS — C169 Malignant neoplasm of stomach, unspecified: Secondary | ICD-10-CM

## 2020-10-25 DIAGNOSIS — Z5111 Encounter for antineoplastic chemotherapy: Secondary | ICD-10-CM | POA: Diagnosis not present

## 2020-10-25 MED ORDER — PEGFILGRASTIM 6 MG/0.6ML ~~LOC~~ PSKT
PREFILLED_SYRINGE | SUBCUTANEOUS | Status: AC
  Filled 2020-10-25: qty 0.6

## 2020-10-25 MED ORDER — SODIUM CHLORIDE 0.9% FLUSH
10.0000 mL | INTRAVENOUS | Status: DC | PRN
  Administered 2020-10-25: 10 mL
  Filled 2020-10-25: qty 10

## 2020-10-25 MED ORDER — HEPARIN SOD (PORK) LOCK FLUSH 100 UNIT/ML IV SOLN
500.0000 [IU] | Freq: Once | INTRAVENOUS | Status: AC | PRN
  Administered 2020-10-25: 500 [IU]
  Filled 2020-10-25: qty 5

## 2020-10-25 MED ORDER — PEGFILGRASTIM 6 MG/0.6ML ~~LOC~~ PSKT
6.0000 mg | PREFILLED_SYRINGE | Freq: Once | SUBCUTANEOUS | Status: AC
  Administered 2020-10-25: 6 mg via SUBCUTANEOUS

## 2020-10-25 NOTE — Progress Notes (Signed)
Nutrition  RD reached out to [facility] [place] with [employer] [phone]) and patient is on service with home health and being followed by [facility] for J-tube feeding.    Yeimi Debnam B. Zenia Resides, [place], [place] Registered Dietitian [phone] (mobile)

## 2020-10-25 NOTE — Telephone Encounter (Signed)
I attempted call to pt to see how he is feeling since infusion yesterday, no answer.   Per Winnifred Friar, : he is doing fine. only had slight nausea which was relieved by taking compazine. VSS. no other problems.

## 2020-10-25 NOTE — Patient Instructions (Signed)
Pegfilgrastim injection What is this medication? PEGFILGRASTIM (PEG fil gra stim) is a long-acting granulocyte colony-stimulating factor that stimulates the growth of neutrophils, a type of white blood cell important in the body's fight against infection. It is used to reduce the incidence of fever and infection in patients with certain types of cancer who are receiving chemotherapy that affects the bone marrow, and toincrease survival after being exposed to high doses of radiation. This medicine may be used for other purposes; ask your health care provider orpharmacist if you have questions. COMMON BRAND NAME(S): Rexene Edison, Ziextenzo What should I tell my care team before I take this medication? They need to know if you have any of these conditions: kidney disease latex allergy ongoing radiation therapy sickle cell disease skin reactions to acrylic adhesives (On-Body Injector only) an unusual or allergic reaction to pegfilgrastim, filgrastim, other medicines, foods, dyes, or preservatives pregnant or trying to get pregnant breast-feeding How should I use this medication? This medicine is for injection under the skin. If you get this medicine at home, you will be taught how to prepare and give the pre-filled syringe or how to use the On-body Injector. Refer to the patient Instructions for Use for detailed instructions. Use exactly as directed. Tell your healthcare provider immediately if you suspect that the On-body Injector may not have performed as intended or if you suspect the use of the On-body Injector resulted in a missedor partial dose. It is important that you put your used needles and syringes in a special sharps container. Do not put them in a trash can. If you do not have a sharpscontainer, call your pharmacist or healthcare provider to get one. Talk to your pediatrician regarding the use of this medicine in children. Whilethis drug may be prescribed for  selected conditions, precautions do apply. Overdosage: If you think you have taken too much of this medicine contact apoison control center or emergency room at once. NOTE: This medicine is only for you. Do not share this medicine with others. What if I miss a dose? It is important not to miss your dose. Call your doctor or health care professional if you miss your dose. If you miss a dose due to an On-body Injector failure or leakage, a new dose should be administered as soon aspossible using a single prefilled syringe for manual use. What may interact with this medication? Interactions have not been studied. This list may not describe all possible interactions. Give your health care provider a list of all the medicines, herbs, non-prescription drugs, or dietary supplements you use. Also tell them if you smoke, drink alcohol, or use illegaldrugs. Some items may interact with your medicine. What should I watch for while using this medication? Your condition will be monitored carefully while you are receiving thismedicine. You may need blood work done while you are taking this medicine. Talk to your health care provider about your risk of cancer. You may be more atrisk for certain types of cancer if you take this medicine. If you are going to need a MRI, CT scan, or other procedure, tell your doctorthat you are using this medicine (On-Body Injector only). What side effects may I notice from receiving this medication? Side effects that you should report to your doctor or health care professionalas soon as possible: allergic reactions (skin rash, itching or hives, swelling of the face, lips, or tongue) back pain dizziness fever pain, redness, or irritation at site where injected pinpoint red spots on the  skin red or dark-brown urine shortness of breath or breathing problems stomach or side pain, or pain at the shoulder swelling tiredness trouble passing urine or change in the amount of  urine unusual bruising or bleeding Side effects that usually do not require medical attention (report to yourdoctor or health care professional if they continue or are bothersome): bone pain muscle pain This list may not describe all possible side effects. Call your doctor for medical advice about side effects. You may report side effects to FDA at1-800-FDA-1088. *instructed the patient and his wife to go pick up some claritin for the patient to take by mouth for bone pain. The patient is to take this for 4 days post neulasta(pegfilgrastim) injection. The patient and wife verbalized understanding. A fact sheet for the Neulasta(Pegfilgrastim) Onpro was given to the patient and wife. Where should I keep my medication? Keep out of the reach of children. If you are using this medicine at home, you will be instructed on how to storeit. Throw away any unused medicine after the expiration date on the label. NOTE: This sheet is a summary. It may not cover all possible information. If you have questions about this medicine, talk to your doctor, pharmacist, orhealth care provider.  2022 [employer] (2020-05-31 11:54:14)

## 2020-10-29 DIAGNOSIS — Z09 Encounter for follow-up examination after completed treatment for conditions other than malignant neoplasm: Secondary | ICD-10-CM | POA: Diagnosis not present

## 2020-11-04 NOTE — Progress Notes (Signed)
[facility]  [address] [place],  [place]  [address] [phone]  Clinic Day:  11/06/2020  Referring physician: Burney Gauze, MD  This document serves as a record of services personally performed by Alexander Macarthur Critchley, MD. It was created on their behalf by [name], a trained medical scribe. The creation of this record is based on the scribe's personal observations and the provider's statements to them.  HISTORY OF PRESENT ILLNESS:  The patient is a 65 y.o. male with stage IVA (T4b N0 M0) gastric adenocarcinoma, with his disease abutting his pancreas. He comes in today prior to heading into his 2nd cycle of neoadjuvant FLOT.  The patient tolerated his 1st cycle without significant difficulty.  He has had increased fatigue.  However, he is pleased as he no longer has problems with dysphagia or vomiting.  He denies having any abdominal pain or other GI symptoms which concern him disease-related complications.    PHYSICAL EXAM:  Blood pressure (!) 138/100, pulse 89, temperature 98 F (36.7 C), resp. rate 16, height _0  (1.6 m), weight 145 lb 6.4 oz (66 kg), SpO2 96 %. Wt Readings from Last 3 Encounters:  11/06/20 145 lb 6.4 oz (66 kg)  10/25/20 151 lb (68.5 kg)  10/24/20 151 lb (68.5 kg)   Body mass index is 25.76 kg/m. Performance status (ECOG): 1 - Symptomatic but completely ambulatory Physical Exam Constitutional:      Appearance: Normal appearance. He is not ill-appearing.  HENT:     Mouth/Throat:     Mouth: Mucous membranes are moist.     Pharynx: Oropharynx is clear. No oropharyngeal exudate or posterior oropharyngeal erythema.  Cardiovascular:     Rate and Rhythm: Normal rate and regular rhythm.     Heart sounds: No murmur heard.   No friction rub. No gallop.  Pulmonary:     Effort: Pulmonary effort is normal. No respiratory distress.     Breath sounds: Normal breath sounds. No wheezing, rhonchi or rales.  Chest:  Breasts:     Right: No axillary adenopathy or supraclavicular adenopathy.     Left: No axillary adenopathy or supraclavicular adenopathy.  Abdominal:     General: Bowel sounds are normal. There is no distension.     Palpations: Abdomen is soft. There is no mass.     Tenderness: There is no abdominal tenderness.     Comments: No palpable abdominal masses.   Musculoskeletal:        General: No swelling.     Right lower leg: No edema.     Left lower leg: No edema.  Lymphadenopathy:     Cervical: No cervical adenopathy.     Upper Body:     Right upper body: No supraclavicular or axillary adenopathy.     Left upper body: No supraclavicular or axillary adenopathy.     Lower Body: No right inguinal adenopathy. No left inguinal adenopathy.  Skin:    General: Skin is warm.     Coloration: Skin is not jaundiced.     Findings: No lesion or rash.  Neurological:     General: No focal deficit present.     Mental Status: He is alert and oriented to person, place, and time. Mental status is at baseline.     Cranial Nerves: Cranial nerves are intact.  Psychiatric:        Mood and Affect: Mood normal.        Behavior: Behavior normal.  Thought Content: Thought content normal.   ASSESSMENT & PLAN:  A 65 y.o. male who appears to only have stage IVA (T4b N0 M0) gastric adenocarcinoma.  He will proceed with his 2nd cycle of neoadjuvant FLOT (infusional 5-FU/ leucovorin/oxaliplatin/docetaxel).  Of note, he did test Her2 negative, which is why Herceptin will not be added.  Although he appears slightly fatigued, he clinically appears to be holding his own.  I will see him back in 2 weeks before he heads into his 3rd cycle of FLOT.  The patient and his wife understand all the plans discussed today and are in agreement with them.   I, Rita Ohara, am acting as scribe for Alexander Potter, MD    I have reviewed this report as typed by the medical scribe, and it is complete and accurate.  Alexander Macarthur Critchley,  MD

## 2020-11-06 ENCOUNTER — Inpatient Hospital Stay: Payer: PPO

## 2020-11-06 ENCOUNTER — Encounter: Payer: Self-pay | Admitting: Oncology

## 2020-11-06 ENCOUNTER — Other Ambulatory Visit: Payer: Self-pay | Admitting: Oncology

## 2020-11-06 ENCOUNTER — Inpatient Hospital Stay (INDEPENDENT_AMBULATORY_CARE_PROVIDER_SITE_OTHER): Payer: PPO | Admitting: Oncology

## 2020-11-06 ENCOUNTER — Other Ambulatory Visit: Payer: Self-pay

## 2020-11-06 VITALS — BP 138/100 | HR 89 | Temp 98.0°F | Resp 16 | Ht 63.0 in | Wt 145.4 lb

## 2020-11-06 DIAGNOSIS — C169 Malignant neoplasm of stomach, unspecified: Secondary | ICD-10-CM | POA: Diagnosis not present

## 2020-11-06 LAB — HEPATIC FUNCTION PANEL
ALT: 32 (ref 10–40)
AST: 29 (ref 14–40)
Alkaline Phosphatase: 140 — AB (ref 25–125)
Bilirubin, Total: 0.6

## 2020-11-06 LAB — CBC AND DIFFERENTIAL
HCT: 39 — AB (ref 41–53)
Hemoglobin: 13.1 — AB (ref 13.5–17.5)
Neutrophils Absolute: 18.32
Platelets: 234 (ref 150–399)
WBC: 21.3

## 2020-11-06 LAB — COMPREHENSIVE METABOLIC PANEL
Albumin: 3.6 (ref 3.5–5.0)
Calcium: 8.7 (ref 8.7–10.7)

## 2020-11-06 LAB — BASIC METABOLIC PANEL
BUN: 10 (ref 4–21)
CO2: 25 — AB (ref 13–22)
Chloride: 95 — AB (ref 99–108)
Creatinine: 0.8 (ref 0.6–1.3)
Glucose: 141
Potassium: 3.2 — AB (ref 3.4–5.3)
Sodium: 132 — AB (ref 137–147)

## 2020-11-06 LAB — CBC: RBC: 4.7 (ref 3.87–5.11)

## 2020-11-07 ENCOUNTER — Other Ambulatory Visit: Payer: Self-pay | Admitting: Pharmacist

## 2020-11-07 ENCOUNTER — Inpatient Hospital Stay: Payer: PPO

## 2020-11-07 ENCOUNTER — Other Ambulatory Visit: Payer: PPO

## 2020-11-07 ENCOUNTER — Ambulatory Visit: Payer: PPO | Admitting: Oncology

## 2020-11-07 VITALS — BP 139/87 | HR 75 | Temp 97.9°F | Resp 16 | Wt 146.0 lb

## 2020-11-07 DIAGNOSIS — C169 Malignant neoplasm of stomach, unspecified: Secondary | ICD-10-CM

## 2020-11-07 DIAGNOSIS — Z5111 Encounter for antineoplastic chemotherapy: Secondary | ICD-10-CM | POA: Diagnosis not present

## 2020-11-07 MED ORDER — LEUCOVORIN CALCIUM INJECTION 350 MG
199.0000 mg/m2 | Freq: Once | INTRAVENOUS | Status: AC
  Administered 2020-11-07: 350 mg via INTRAVENOUS
  Filled 2020-11-07: qty 17.5

## 2020-11-07 MED ORDER — SODIUM CHLORIDE 0.9 % IV SOLN
10.0000 mg | Freq: Once | INTRAVENOUS | Status: AC
  Administered 2020-11-07: 10 mg via INTRAVENOUS
  Filled 2020-11-07: qty 1

## 2020-11-07 MED ORDER — SODIUM CHLORIDE 0.9 % IV SOLN
2600.0000 mg/m2 | INTRAVENOUS | Status: DC
  Administered 2020-11-07: 4600 mg via INTRAVENOUS
  Filled 2020-11-07: qty 92

## 2020-11-07 MED ORDER — OXALIPLATIN CHEMO INJECTION 100 MG/20ML
85.0000 mg/m2 | Freq: Once | INTRAVENOUS | Status: AC
  Administered 2020-11-07: 150 mg via INTRAVENOUS
  Filled 2020-11-07: qty 20

## 2020-11-07 MED ORDER — PALONOSETRON HCL INJECTION 0.25 MG/5ML
0.2500 mg | Freq: Once | INTRAVENOUS | Status: AC
  Administered 2020-11-07: 0.25 mg via INTRAVENOUS

## 2020-11-07 MED ORDER — DEXTROSE 5 % IV SOLN
Freq: Once | INTRAVENOUS | Status: AC
  Filled 2020-11-07: qty 250

## 2020-11-07 MED ORDER — SODIUM CHLORIDE 0.9 % IV SOLN
50.0000 mg/m2 | Freq: Once | INTRAVENOUS | Status: AC
  Administered 2020-11-07: 90 mg via INTRAVENOUS
  Filled 2020-11-07: qty 9

## 2020-11-07 NOTE — Patient Instructions (Signed)
Leucovorin injection What is this medication? LEUCOVORIN (loo koe VOR in) is used to prevent or treat the harmful effects of some medicines. This medicine is used to treat anemia caused by a low amount of folic acid in the body. It is also used with 5-fluorouracil (5-FU) to treatcolon cancer. This medicine may be used for other purposes; ask your health care provider orpharmacist if you have questions. What should I tell my care team before I take this medication? They need to know if you have any of these conditions: anemia from low levels of vitamin B-12 in the blood an unusual or allergic reaction to leucovorin, folic acid, other medicines, foods, dyes, or preservatives pregnant or trying to get pregnant breast-feeding How should I use this medication? This medicine is for injection into a muscle or into a vein. It is given by ahealth care professional in a hospital or clinic setting. Talk to your pediatrician regarding the use of this medicine in children.Special care may be needed. Overdosage: If you think you have taken too much of this medicine contact apoison control center or emergency room at once. NOTE: This medicine is only for you. Do not share this medicine with others. What if I miss a dose? This does not apply. What may interact with this medication? capecitabine fluorouracil phenobarbital phenytoin primidone trimethoprim-sulfamethoxazole This list may not describe all possible interactions. Give your health care provider a list of all the medicines, herbs, non-prescription drugs, or dietary supplements you use. Also tell them if you smoke, drink alcohol, or use illegaldrugs. Some items may interact with your medicine. What should I watch for while using this medication? Your condition will be monitored carefully while you are receiving thismedicine. This medicine may increase the side effects of 5-fluorouracil, 5-FU. Tell your doctor or health care professional if you have  diarrhea or mouth sores that donot get better or that get worse. What side effects may I notice from receiving this medication? Side effects that you should report to your doctor or health care professionalas soon as possible: allergic reactions like skin rash, itching or hives, swelling of the face, lips, or tongue breathing problems fever, infection mouth sores unusual bleeding or bruising unusually weak or tired Side effects that usually do not require medical attention (report to yourdoctor or health care professional if they continue or are bothersome): constipation or diarrhea loss of appetite nausea, vomiting This list may not describe all possible side effects. Call your doctor for medical advice about side effects. You may report side effects to FDA at1-800-FDA-1088. Where should I keep my medication? This drug is given in a hospital or clinic and will not be stored at home. NOTE: This sheet is a summary. It may not cover all possible information. If you have questions about this medicine, talk to your doctor, pharmacist, orhealth care provider.  2022 Elsevier/Gold Standard (2007-11-08 16:50:29) Oxaliplatin Injection What is this medication? OXALIPLATIN (ox AL i PLA tin) is a chemotherapy drug. It targets fast dividing cells, like cancer cells, and causes these cells to die. This medicine is usedto treat cancers of the colon and rectum, and many other cancers. This medicine may be used for other purposes; ask your health care provider orpharmacist if you have questions. COMMON BRAND NAME(S): Eloxatin What should I tell my care team before I take this medication? They need to know if you have any of these conditions: heart disease history of irregular heartbeat liver disease low blood counts, like white cells, platelets, or red  blood cells lung or breathing disease, like asthma take medicines that treat or prevent blood clots tingling of the fingers or toes, or other nerve  disorder an unusual or allergic reaction to oxaliplatin, other chemotherapy, other medicines, foods, dyes, or preservatives pregnant or trying to get pregnant breast-feeding How should I use this medication? This drug is given as an infusion into a vein. It is administered in a hospitalor clinic by a specially trained health care professional. Talk to your pediatrician regarding the use of this medicine in children.Special care may be needed. Overdosage: If you think you have taken too much of this medicine contact apoison control center or emergency room at once. NOTE: This medicine is only for you. Do not share this medicine with others. What if I miss a dose? It is important not to miss a dose. Call your doctor or health careprofessional if you are unable to keep an appointment. What may interact with this medication? Do not take this medicine with any of the following medications: cisapride dronedarone pimozide thioridazine This medicine may also interact with the following medications: aspirin and aspirin-like medicines certain medicines that treat or prevent blood clots like warfarin, apixaban, dabigatran, and rivaroxaban cisplatin cyclosporine diuretics medicines for infection like acyclovir, adefovir, amphotericin B, bacitracin, cidofovir, foscarnet, ganciclovir, gentamicin, pentamidine, vancomycin NSAIDs, medicines for pain and inflammation, like ibuprofen or naproxen other medicines that prolong the QT interval (an abnormal heart rhythm) pamidronate zoledronic acid This list may not describe all possible interactions. Give your health care provider a list of all the medicines, herbs, non-prescription drugs, or dietary supplements you use. Also tell them if you smoke, drink alcohol, or use illegaldrugs. Some items may interact with your medicine. What should I watch for while using this medication? Your condition will be monitored carefully while you are receiving  thismedicine. You may need blood work done while you are taking this medicine. This medicine may make you feel generally unwell. This is not uncommon as chemotherapy can affect healthy cells as well as cancer cells. Report any side effects. Continue your course of treatment even though you feel ill unless yourhealthcare professional tells you to stop. This medicine can make you more sensitive to cold. Do not drink cold drinks or use ice. Cover exposed skin before coming in contact with cold temperatures or cold objects. When out in cold weather wear warm clothing and cover your mouth and nose to warm the air that goes into your lungs. Tell your doctor if you getsensitive to the cold. Do not become pregnant while taking this medicine or for 9 months after stopping it. Women should inform their health care professional if they wish to become pregnant or think they might be pregnant. Men should not father a child while taking this medicine and for 6 months after stopping it. There is potential for serious side effects to an unborn child. Talk to your health careprofessional for more information. Do not breast-feed a child while taking this medicine or for 3 months afterstopping it. This medicine has caused ovarian failure in some women. This medicine may make it more difficult to get pregnant. Talk to your health care professional if Alexander [name] concerned about your fertility. This medicine has caused decreased sperm counts in some men. This may make it more difficult to father a child. Talk to your health care professional if Alexander [name] concerned about your fertility. This medicine may increase your risk of getting an infection. Call your health care professional for advice if you get  a fever, chills, or sore throat, or other symptoms of a cold or flu. Do not treat yourself. Try to avoid beingaround people who are sick. Avoid taking medicines that contain aspirin, acetaminophen, ibuprofen, naproxen, or ketoprofen  unless instructed by your health care professional.These medicines may hide a fever. Be careful brushing or flossing your teeth or using a toothpick because you may get an infection or bleed more easily. If you have any dental work done, Primary school teacher you are receiving this medicine. What side effects may I notice from receiving this medication? Side effects that you should report to your doctor or health care professionalas soon as possible: allergic reactions like skin rash, itching or hives, swelling of the face, lips, or tongue breathing problems cough low blood counts - this medicine may decrease the number of white blood cells, red blood cells, and platelets. You may be at increased risk for infections and bleeding nausea, vomiting pain, redness, or irritation at site where injected pain, tingling, numbness in the hands or feet signs and symptoms of bleeding such as bloody or black, tarry stools; red or dark brown urine; spitting up blood or brown material that looks like coffee grounds; red spots on the skin; unusual bruising or bleeding from the eyes, gums, or nose signs and symptoms of a dangerous change in heartbeat or heart rhythm like chest pain; dizziness; fast, irregular heartbeat; palpitations; feeling faint or lightheaded; falls signs and symptoms of infection like fever; chills; cough; sore throat; pain or trouble passing urine signs and symptoms of liver injury like dark yellow or brown urine; general ill feeling or flu-like symptoms; light-colored stools; loss of appetite; nausea; right upper belly pain; unusually weak or tired; yellowing of the eyes or skin signs and symptoms of low red blood cells or anemia such as unusually weak or tired; feeling faint or lightheaded; falls signs and symptoms of muscle injury like dark urine; trouble passing urine or change in the amount of urine; unusually weak or tired; muscle pain; back pain Side effects that usually do not require  medical attention (report to yourdoctor or health care professional if they continue or are bothersome): changes in taste diarrhea gas hair loss loss of appetite mouth sores This list may not describe all possible side effects. Call your doctor for medical advice about side effects. You may report side effects to FDA at1-800-FDA-1088. Where should I keep my medication? This drug is given in a hospital or clinic and will not be stored at home. NOTE: This sheet is a summary. It may not cover all possible information. If you have questions about this medicine, talk to your doctor, pharmacist, orhealth care provider.  2022 Elsevier/Gold Standard (2018-09-21 12:20:35) Docetaxel injection What is this medication? DOCETAXEL (doe se TAX el) is a chemotherapy drug. It targets fast dividing cells, like cancer cells, and causes these cells to die. This medicine is used to treat many types of cancers like breast cancer, certain stomach cancers,head and neck cancer, lung cancer, and prostate cancer. This medicine may be used for other purposes; ask your health care provider orpharmacist if you have questions. COMMON BRAND NAME(S): Docefrez, Taxotere What should I tell my care team before I take this medication? They need to know if you have any of these conditions: infection (especially a virus infection such as chickenpox, cold sores, or herpes) liver disease low blood counts, like low white cell, platelet, or red cell counts an unusual or allergic reaction to docetaxel, polysorbate 80, other chemotherapy agents,  other medicines, foods, dyes, or preservatives pregnant or trying to get pregnant breast-feeding How should I use this medication? This drug is given as an infusion into a vein. It is administered in a hospitalor clinic by a specially trained health care professional. Talk to your pediatrician regarding the use of this medicine in children.Special care may be needed. Overdosage: If you  think you have taken too much of this medicine contact apoison control center or emergency room at once. NOTE: This medicine is only for you. Do not share this medicine with others. What if I miss a dose? It is important not to miss your dose. Call your doctor or health careprofessional if you are unable to keep an appointment. What may interact with this medication? Do not take this medicine with any of the following medications: live virus vaccines This medicine may also interact with the following medications: aprepitant certain antibiotics like erythromycin or clarithromycin certain antivirals for HIV or hepatitis certain medicines for fungal infections like fluconazole, itraconazole, ketoconazole, posaconazole, or voriconazole cimetidine ciprofloxacin conivaptan cyclosporine dronedarone fluvoxamine grapefruit juice imatinib verapamil This list may not describe all possible interactions. Give your health care provider a list of all the medicines, herbs, non-prescription drugs, or dietary supplements you use. Also tell them if you smoke, drink alcohol, or use illegaldrugs. Some items may interact with your medicine. What should I watch for while using this medication? Your condition will be monitored carefully while you are receiving this medicine. You will need important blood work done while you are taking thismedicine. Call your doctor or health care professional for advice if you get a fever, chills or sore throat, or other symptoms of a cold or flu. Do not treat yourself. This drug decreases your body's ability to fight infections. Try toavoid being around people who are sick. Some products may contain alcohol. Ask your health care professional if this medicine contains alcohol. Be sure to tell all health care professionals you are taking this medicine. Certain medicines, like metronidazole and disulfiram, can cause an unpleasant reaction when taken with alcohol. The reaction  includes flushing, headache, nausea, vomiting, sweating, and increased thirst. Thereaction can last from 30 minutes to several hours. You may get drowsy or dizzy. Do not drive, use machinery, or do anything that needs mental alertness until you know how this medicine affects you. Do not stand or sit up quickly, especially if you are an older patient. This reduces the risk of dizzy or fainting spells. Alcohol may interfere with the effect ofthis medicine. Talk to your health care professional about your risk of cancer. You may bemore at risk for certain types of cancer if you take this medicine. Do not become pregnant while taking this medicine or for 6 months after stopping it. Women should inform their doctor if they wish to become pregnant or think they might be pregnant. There is a potential for serious side effects to an unborn child. Talk to your health care professional or pharmacist for more information. Do not breast-feed an infant while taking this medicine orfor 1 week after stopping it. Males who get this medicine must use a condom during sex with females who can get pregnant. If you get a woman pregnant, the baby could have birth defects. The baby could die before they are born. You will need to continue wearing a condom for 3 months after stopping the medicine. Tell your health care providerright away if your partner becomes pregnant while you are taking this medicine. This may  interfere with the ability to father a child. You should talk to yourdoctor or health care professional if you are concerned about your fertility. What side effects may I notice from receiving this medication? Side effects that you should report to your doctor or health care professionalas soon as possible: allergic reactions like skin rash, itching or hives, swelling of the face, lips, or tongue blurred vision breathing problems changes in vision low blood counts - This drug may decrease the number of white blood  cells, red blood cells and platelets. You may be at increased risk for infections and bleeding. nausea and vomiting pain, redness or irritation at site where injected pain, tingling, numbness in the hands or feet redness, blistering, peeling, or loosening of the skin, including inside the mouth signs of decreased platelets or bleeding - bruising, pinpoint red spots on the skin, black, tarry stools, nosebleeds signs of decreased red blood cells - unusually weak or tired, fainting spells, lightheadedness signs of infection - fever or chills, cough, sore throat, pain or difficulty passing urine swelling of the ankle, feet, hands Side effects that usually do not require medical attention (report to yourdoctor or health care professional if they continue or are bothersome): constipation diarrhea fingernail or toenail changes hair loss loss of appetite mouth sores muscle pain This list may not describe all possible side effects. Call your doctor for medical advice about side effects. You may report side effects to FDA at1-800-FDA-1088. Where should I keep my medication? This drug is given in a hospital or clinic and will not be stored at home. NOTE: This sheet is a summary. It may not cover all possible information. If you have questions about this medicine, talk to your doctor, pharmacist, orhealth care provider.  2022 [employer]/Gold Standard (2019-04-03 19:50:31)

## 2020-11-08 ENCOUNTER — Inpatient Hospital Stay: Payer: PPO

## 2020-11-08 ENCOUNTER — Other Ambulatory Visit: Payer: Self-pay

## 2020-11-08 VITALS — BP 116/88 | HR 72 | Temp 99.1°F | Resp 18 | Ht 63.0 in | Wt 147.5 lb

## 2020-11-08 DIAGNOSIS — Z5111 Encounter for antineoplastic chemotherapy: Secondary | ICD-10-CM | POA: Diagnosis not present

## 2020-11-08 DIAGNOSIS — C169 Malignant neoplasm of stomach, unspecified: Secondary | ICD-10-CM

## 2020-11-08 MED ORDER — HEPARIN SOD (PORK) LOCK FLUSH 100 UNIT/ML IV SOLN
500.0000 [IU] | Freq: Once | INTRAVENOUS | Status: AC | PRN
  Administered 2020-11-08: 500 [IU]
  Filled 2020-11-08: qty 5

## 2020-11-08 MED ORDER — PEGFILGRASTIM 6 MG/0.6ML ~~LOC~~ PSKT
PREFILLED_SYRINGE | SUBCUTANEOUS | Status: AC
  Filled 2020-11-08: qty 0.6

## 2020-11-08 MED ORDER — SODIUM CHLORIDE 0.9% FLUSH
10.0000 mL | INTRAVENOUS | Status: DC | PRN
  Administered 2020-11-08: 10 mL
  Filled 2020-11-08: qty 10

## 2020-11-08 MED ORDER — PEGFILGRASTIM 6 MG/0.6ML ~~LOC~~ PSKT
6.0000 mg | PREFILLED_SYRINGE | Freq: Once | SUBCUTANEOUS | Status: AC
  Administered 2020-11-08: 6 mg via SUBCUTANEOUS

## 2020-11-08 NOTE — Patient Instructions (Signed)
[facility]  Discharge Instructions: Thank you for choosing [facility] to provide your oncology and hematology care.  If you have a lab appointment with the [facility], please go directly to the [facility] and check in at the registration area.   Wear comfortable clothing and clothing appropriate for easy access to any Portacath or PICC line.   We strive to give you quality time with your provider. You may need to reschedule your appointment if you arrive late (15 or more minutes).  Arriving late affects you and other patients whose appointments are after yours.  Also, if you miss three or more appointments without notifying the office, you may be dismissed from the clinic at the provider's discretion.      For prescription refill requests, have your pharmacy contact our office and allow 72 hours for refills to be completed.    Today you received the following chemotherapy and/or immunotherapy agents Neulasta, FlPegfilgrastim injection What is this medication? PEGFILGRASTIM (PEG fil gra stim) is a long-acting granulocyte colony-stimulating factor that stimulates the growth of neutrophils, a type of white blood cell important in the body's fight against infection. It is used to reduce the incidence of fever and infection in patients with certain types of cancer who are receiving chemotherapy that affects the bone marrow, and toincrease survival after being exposed to high doses of radiation. This medicine may be used for other purposes; ask your health care provider orpharmacist if you have questions. COMMON BRAND NAME(S): Rexene Edison, Ziextenzo What should I tell my care team before I take this medication? They need to know if you have any of these conditions: kidney disease latex allergy ongoing radiation therapy sickle cell disease skin reactions to acrylic adhesives (On-Body Injector only) an unusual or allergic  reaction to pegfilgrastim, filgrastim, other medicines, foods, dyes, or preservatives pregnant or trying to get pregnant breast-feeding How should I use this medication? This medicine is for injection under the skin. If you get this medicine at home, you will be taught how to prepare and give the pre-filled syringe or how to use the On-body Injector. Refer to the patient Instructions for Use for detailed instructions. Use exactly as directed. Tell your healthcare provider immediately if you suspect that the On-body Injector may not have performed as intended or if you suspect the use of the On-body Injector resulted in a missedor partial dose. It is important that you put your used needles and syringes in a special sharps container. Do not put them in a trash can. If you do not have a sharpscontainer, call your pharmacist or healthcare provider to get one. Talk to your pediatrician regarding the use of this medicine in children. Whilethis drug may be prescribed for selected conditions, precautions do apply. Overdosage: If you think you have taken too much of this medicine contact apoison control center or emergency room at once. NOTE: This medicine is only for you. Do not share this medicine with others. What if I miss a dose? It is important not to miss your dose. Call your doctor or health care professional if you miss your dose. If you miss a dose due to an On-body Injector failure or leakage, a new dose should be administered as soon aspossible using a single prefilled syringe for manual use. What may interact with this medication? Interactions have not been studied. This list may not describe all possible interactions. Give your health care provider a list of all  the medicines, herbs, non-prescription drugs, or dietary supplements you use. Also tell them if you smoke, drink alcohol, or use illegaldrugs. Some items may interact with your medicine. What should I watch for while using this  medication? Your condition will be monitored carefully while you are receiving thismedicine. You may need blood work done while you are taking this medicine. Talk to your health care provider about your risk of cancer. You may be more atrisk for certain types of cancer if you take this medicine. If you are going to need a MRI, CT scan, or other procedure, tell your doctorthat you are using this medicine (On-Body Injector only). What side effects may I notice from receiving this medication? Side effects that you should report to your doctor or health care professionalas soon as possible: allergic reactions (skin rash, itching or hives, swelling of the face, lips, or tongue) back pain dizziness fever pain, redness, or irritation at site where injected pinpoint red spots on the skin red or dark-brown urine shortness of breath or breathing problems stomach or side pain, or pain at the shoulder swelling tiredness trouble passing urine or change in the amount of urine unusual bruising or bleeding Side effects that usually do not require medical attention (report to yourdoctor or health care professional if they continue or are bothersome): bone pain muscle pain This list may not describe all possible side effects. Call your doctor for medical advice about side effects. You may report side effects to FDA at1-800-FDA-1088. Where should I keep my medication? Keep out of the reach of children. If you are using this medicine at home, you will be instructed on how to storeit. Throw away any unused medicine after the expiration date on the label. NOTE: This sheet is a summary. It may not cover all possible information. If you have questions about this medicine, talk to your doctor, pharmacist, orhealth care provider.  2022 Elsevier/Gold Standard (2020-05-31 11:54:14) uorouracil    To help prevent nausea and vomiting after your treatment, we encourage you to take your nausea medication as  directed.  BELOW ARE SYMPTOMS THAT SHOULD BE REPORTED IMMEDIATELY: *FEVER GREATER THAN 100.4 F (38 C) OR HIGHER *CHILLS OR SWEATING *NAUSEA AND VOMITING THAT IS NOT CONTROLLED WITH YOUR NAUSEA MEDICATION *UNUSUAL SHORTNESS OF BREATH *UNUSUAL BRUISING OR BLEEDING *URINARY PROBLEMS (pain or burning when urinating, or frequent urination) *BOWEL PROBLEMS (unusual diarrhea, constipation, pain near the anus) TENDERNESS IN MOUTH AND THROAT WITH OR WITHOUT PRESENCE OF ULCERS (sore throat, sores in mouth, or a toothache) UNUSUAL RASH, SWELLING OR PAIN  UNUSUAL VAGINAL DISCHARGE OR ITCHING   Items with * indicate a potential emergency and should be followed up as soon as possible or go to the Emergency Department if any problems should occur.  Please show the CHEMOTHERAPY ALERT CARD or IMMUNOTHERAPY ALERT CARD at check-in to the Emergency Department and triage nurse.  Should you have questions after your visit or need to cancel or reschedule your appointment, please contact [facility]  Dept: [phone]  and follow the prompts.  Office hours are 8:00 a.m. to 4:30 p.m. Monday - Friday. Please note that voicemails left after 4:00 p.m. may not be returned until the following business day.  We are closed weekends and major holidays. You have access to a nurse at all times for urgent questions. Please call the main number to the clinic Dept: [phone] and follow the prompts.  For any non-urgent questions, you may also contact your provider using  MyChart. We now offer e-Visits for anyone 93 and older to request care online for non-urgent symptoms. For details visit [email or web address].   Also download the MyChart app! Go to the app store, search "MyChart", open the app, select [facility], and log in with your MyChart username and password.  Due to Covid, a mask is required upon entering the hospital/clinic. If you do not have a mask, one will be given to you upon arrival.  For doctor visits, patients may have 1 support person aged 7 or older with them. For treatment visits, patients cannot have anyone with them due to current Covid guidelines and our immunocompromised population.

## 2020-11-08 NOTE — Progress Notes (Signed)
1418: PT STABLE AT TIME OF DISCHARGE

## 2020-11-13 ENCOUNTER — Other Ambulatory Visit: Payer: Self-pay | Admitting: Hematology and Oncology

## 2020-11-13 DIAGNOSIS — Z79899 Other long term (current) drug therapy: Secondary | ICD-10-CM

## 2020-11-14 ENCOUNTER — Encounter: Payer: Self-pay | Admitting: Oncology

## 2020-11-14 NOTE — Progress Notes (Signed)
[facility]  [address] [place],  [place]  [address] [phone]  Clinic Day:  11/21/2020  Referring physician: Burney Gauze, MD  This document serves as a record of services personally performed by Dequincy Macarthur Critchley, MD. It was created on their behalf by [facility], a trained medical scribe. The creation of this record is based on the scribe's personal observations and the provider's statements to them.  HISTORY OF PRESENT ILLNESS:  The patient is a 65 y.o. male with stage IVA (T4b N0 M0) gastric adenocarcinoma, with his disease abutting his pancreas. He comes in today prior to heading into his 3rd cycle of neoadjuvant FLOT chemotherapy.  The patient definitely has had progressive fatigue after each successive cycle of treatment.  He has noticed the cold insensitivities that can go along with the oxaliplatin component of chemotherapy.  As it pertains to his cancer, he denies having dysphagia, vomiting, abdominal pain or other GI symptoms which concern him disease progression.   PHYSICAL EXAM:  Blood pressure 133/87, pulse 95, temperature 98 F (36.7 C), resp. rate 16, height 5\' 3"  (1.6 m), weight 140 lb (63.5 kg), SpO2 99 %. Wt Readings from Last 3 Encounters:  11/21/20 140 lb (63.5 kg)  11/08/20 147 lb 8 oz (66.9 kg)  11/07/20 146 lb 0.6 oz (66.2 kg)   Body mass index is 24.8 kg/m. Performance status (ECOG): 1 - Symptomatic but completely ambulatory Physical Exam Constitutional:      Appearance: Normal appearance. He is not ill-appearing.  HENT:     Mouth/Throat:     Mouth: Mucous membranes are moist.     Pharynx: Oropharynx is clear. No oropharyngeal exudate or posterior oropharyngeal erythema.  Cardiovascular:     Rate and Rhythm: Normal rate and regular rhythm.     Heart sounds: No murmur heard.   No friction rub. No gallop.  Pulmonary:     Effort: Pulmonary effort is normal. No respiratory distress.     Breath sounds: Normal breath  sounds. No wheezing, rhonchi or rales.  Chest:  Breasts:    Right: No axillary adenopathy or supraclavicular adenopathy.     Left: No axillary adenopathy or supraclavicular adenopathy.  Abdominal:     General: Bowel sounds are normal. There is no distension.     Palpations: Abdomen is soft. There is no mass.     Tenderness: There is no abdominal tenderness.     Comments: No palpable abdominal masses.   Musculoskeletal:        General: No swelling.     Right lower leg: No edema.     Left lower leg: No edema.  Lymphadenopathy:     Cervical: No cervical adenopathy.     Upper Body:     Right upper body: No supraclavicular or axillary adenopathy.     Left upper body: No supraclavicular or axillary adenopathy.     Lower Body: No right inguinal adenopathy. No left inguinal adenopathy.  Skin:    General: Skin is warm.     Coloration: Skin is not jaundiced.     Findings: No lesion or rash.  Neurological:     General: No focal deficit present.     Mental Status: He is alert and oriented to person, place, and time. Mental status is at baseline.     Cranial Nerves: Cranial nerves are intact.  Psychiatric:        Mood and Affect: Mood normal.        Behavior: Behavior  normal.        Thought Content: Thought content normal.   LABS:     ASSESSMENT & PLAN:  A 65 y.o. male with stage IVA (T4b N0 M0) gastric adenocarcinoma.  He will proceed with his 3rd cycle of neoadjuvant FLOT (infusional 5-FU/ leucovorin/oxaliplatin/docetaxel).  As his white count is very high, his white cell shot therapy will be held with this cycle of treatment.  Although he appears slightly fatigued, he clinically appears to be holding his own.  I will see him back in 2 weeks before he heads into his 4th and final cycle of neoadjuvant FLOT.  The patient and his wife understand all the plans discussed today and are in agreement with them.   I, Rita Ohara, am acting as scribe for [name], MD    I have reviewed  this report as typed by the medical scribe, and it is complete and accurate.  Dequincy Macarthur Critchley, MD

## 2020-11-21 ENCOUNTER — Inpatient Hospital Stay: Payer: PPO

## 2020-11-21 ENCOUNTER — Other Ambulatory Visit: Payer: Self-pay | Admitting: Oncology

## 2020-11-21 ENCOUNTER — Other Ambulatory Visit: Payer: Self-pay | Admitting: Hematology and Oncology

## 2020-11-21 ENCOUNTER — Inpatient Hospital Stay: Payer: PPO | Attending: Oncology | Admitting: Oncology

## 2020-11-21 ENCOUNTER — Other Ambulatory Visit: Payer: Self-pay

## 2020-11-21 VITALS — BP 133/87 | HR 95 | Temp 98.0°F | Resp 16 | Ht 63.0 in | Wt 140.0 lb

## 2020-11-21 DIAGNOSIS — C169 Malignant neoplasm of stomach, unspecified: Secondary | ICD-10-CM

## 2020-11-21 DIAGNOSIS — E860 Dehydration: Secondary | ICD-10-CM | POA: Insufficient documentation

## 2020-11-21 DIAGNOSIS — Z5111 Encounter for antineoplastic chemotherapy: Secondary | ICD-10-CM | POA: Diagnosis not present

## 2020-11-21 DIAGNOSIS — Z79899 Other long term (current) drug therapy: Secondary | ICD-10-CM | POA: Insufficient documentation

## 2020-11-21 LAB — CBC AND DIFFERENTIAL
HCT: 37 — AB (ref 41–53)
Hemoglobin: 12.2 — AB (ref 13.5–17.5)
Neutrophils Absolute: 33.09
Platelets: 240 (ref 150–399)
WBC: 37.6

## 2020-11-21 LAB — BASIC METABOLIC PANEL
BUN: 14 (ref 4–21)
CO2: 22 (ref 13–22)
Chloride: 92 — AB (ref 99–108)
Creatinine: 0.6 (ref 0.6–1.3)
Glucose: 194
Potassium: 3.4 (ref 3.4–5.3)
Sodium: 126 — AB (ref 137–147)

## 2020-11-21 LAB — HEPATIC FUNCTION PANEL
ALT: 19 (ref 10–40)
AST: 24 (ref 14–40)
Alkaline Phosphatase: 117 (ref 25–125)
Bilirubin, Total: 0.8

## 2020-11-21 LAB — COMPREHENSIVE METABOLIC PANEL
Albumin: 3.3 — AB (ref 3.5–5.0)
Calcium: 8.5 — AB (ref 8.7–10.7)

## 2020-11-21 LAB — CBC: RBC: 4.44 (ref 3.87–5.11)

## 2020-11-21 MED ORDER — PALONOSETRON HCL INJECTION 0.25 MG/5ML
0.2500 mg | Freq: Once | INTRAVENOUS | Status: AC
  Administered 2020-11-21: 0.25 mg via INTRAVENOUS

## 2020-11-21 MED ORDER — SODIUM CHLORIDE 0.9 % IV SOLN
2600.0000 mg/m2 | INTRAVENOUS | Status: DC
  Administered 2020-11-21: 4600 mg via INTRAVENOUS
  Filled 2020-11-21: qty 92

## 2020-11-21 MED ORDER — SODIUM CHLORIDE 0.9 % IV SOLN
10.0000 mg | Freq: Once | INTRAVENOUS | Status: AC
  Administered 2020-11-21: 10 mg via INTRAVENOUS
  Filled 2020-11-21: qty 1

## 2020-11-21 MED ORDER — PALONOSETRON HCL INJECTION 0.25 MG/5ML
INTRAVENOUS | Status: AC
  Filled 2020-11-21: qty 5

## 2020-11-21 MED ORDER — LEUCOVORIN CALCIUM INJECTION 350 MG
199.0000 mg/m2 | Freq: Once | INTRAVENOUS | Status: AC
  Administered 2020-11-21: 350 mg via INTRAVENOUS
  Filled 2020-11-21: qty 17.5

## 2020-11-21 MED ORDER — DEXTROSE 5 % IV SOLN
Freq: Once | INTRAVENOUS | Status: AC
Start: 2020-11-21 — End: 2020-11-21
  Filled 2020-11-21: qty 250

## 2020-11-21 MED ORDER — OXALIPLATIN CHEMO INJECTION 100 MG/20ML
85.0000 mg/m2 | Freq: Once | INTRAVENOUS | Status: AC
  Administered 2020-11-21: 150 mg via INTRAVENOUS
  Filled 2020-11-21: qty 20

## 2020-11-21 MED ORDER — SODIUM CHLORIDE 0.9 % IV SOLN
50.0000 mg/m2 | Freq: Once | INTRAVENOUS | Status: AC
  Administered 2020-11-21: 90 mg via INTRAVENOUS
  Filled 2020-11-21: qty 9

## 2020-11-21 NOTE — Progress Notes (Signed)
Cancel pegfilgrastim this cycle due to significantly elevated ANC per Dr. Bobby Rumpf.

## 2020-11-22 ENCOUNTER — Other Ambulatory Visit: Payer: Self-pay

## 2020-11-22 ENCOUNTER — Inpatient Hospital Stay: Payer: PPO

## 2020-11-22 VITALS — BP 111/87 | HR 97 | Temp 98.1°F | Resp 18 | Ht 63.0 in | Wt 139.8 lb

## 2020-11-22 DIAGNOSIS — Z5111 Encounter for antineoplastic chemotherapy: Secondary | ICD-10-CM | POA: Diagnosis not present

## 2020-11-22 DIAGNOSIS — C169 Malignant neoplasm of stomach, unspecified: Secondary | ICD-10-CM

## 2020-11-22 MED ORDER — SODIUM CHLORIDE 0.9% FLUSH
10.0000 mL | INTRAVENOUS | Status: DC | PRN
  Administered 2020-11-22: 10 mL
  Filled 2020-11-22: qty 10

## 2020-11-22 MED ORDER — HEPARIN SOD (PORK) LOCK FLUSH 100 UNIT/ML IV SOLN
500.0000 [IU] | Freq: Once | INTRAVENOUS | Status: AC | PRN
Start: 2020-11-22 — End: 2020-11-22
  Administered 2020-11-22: 500 [IU]
  Filled 2020-11-22: qty 5

## 2020-11-22 NOTE — Patient Instructions (Signed)
[facility]  Discharge Instructions: ?Thank you for choosing [facility] to provide your oncology and hematology care.  ?If you have a lab appointment with the Cancer Center, please go directly to the Cancer Center and check in at the registration area. ?  ?Wear comfortable clothing and clothing appropriate for easy access to any Portacath or PICC line.  ? ?We strive to give you quality time with your provider. You may need to reschedule your appointment if you arrive late (15 or more minutes).  Arriving late affects you and other patients whose appointments are after yours.  Also, if you miss three or more appointments without notifying the office, you may be dismissed from the clinic at the provider?s discretion.    ?  ?For prescription refill requests, have your pharmacy contact our office and allow 72 hours for refills to be completed.   ? ?Today you received the following chemotherapy and/or immunotherapy agents 5FLOURUORACIL    ?  ?To help prevent nausea and vomiting after your treatment, we encourage you to take your nausea medication as directed. ? ?BELOW ARE SYMPTOMS THAT SHOULD BE REPORTED IMMEDIATELY: ?*FEVER GREATER THAN 100.4 F (38 ?C) OR HIGHER ?*CHILLS OR SWEATING ?*NAUSEA AND VOMITING THAT IS NOT CONTROLLED WITH YOUR NAUSEA MEDICATION ?*UNUSUAL SHORTNESS OF BREATH ?*UNUSUAL BRUISING OR BLEEDING ?*URINARY PROBLEMS (pain or burning when urinating, or frequent urination) ?*BOWEL PROBLEMS (unusual diarrhea, constipation, pain near the anus) ?TENDERNESS IN MOUTH AND THROAT WITH OR WITHOUT PRESENCE OF ULCERS (sore throat, sores in mouth, or a toothache) ?UNUSUAL RASH, SWELLING OR PAIN  ?UNUSUAL VAGINAL DISCHARGE OR ITCHING  ? ?Items with * indicate a potential emergency and should be followed up as soon as possible or go to the Emergency Department if any problems should occur. ? ?Please show the CHEMOTHERAPY ALERT CARD or IMMUNOTHERAPY ALERT CARD at check-in to the  Emergency Department and triage nurse. ? ?Should you have questions after your visit or need to cancel or reschedule your appointment, please contact [facility]  Dept: [phone]  and follow the prompts.  Office hours are 8:00 a.m. to 4:30 p.m. Monday - Friday. Please note that voicemails left after 4:00 p.m. may not be returned until the following business day.  We are closed weekends and major holidays. You have access to a nurse at all times for urgent questions. Please call the main number to the clinic Dept: [phone] and follow the prompts. ? ?For any non-urgent questions, you may also contact your provider using MyChart. We now offer e-Visits for anyone 18 and older to request care online for non-urgent symptoms. For details visit [email or web address]. ?  ?Also download the MyChart app! Go to the app store, search "MyChart", open the app, select , and log in with your MyChart username and password. ? ?Due to Covid, a mask is required upon entering the hospital/clinic. If you do not have a mask, one will be given to you upon arrival. For doctor visits, patients may have 1 support person aged 18 or older with them. For treatment visits, patients cannot have anyone with them due to current Covid guidelines and our immunocompromised population.  ? ? ?

## 2020-11-29 NOTE — Progress Notes (Signed)
[name]  [address] [place],  [name]  [address] [phone]  Clinic Day:  12/04/2020  Referring physician: Burney Gauze, MD  This document serves as a record of services personally performed by Alexander Norden Macarthur Critchley, MD. It was created on their behalf by [name], a trained medical scribe. The creation of this record is based on the scribe's personal observations and the provider's statements to them.  HISTORY OF PRESENT ILLNESS:  The patient is a 65 y.o. male with stage IVA (T4b N0 M0) gastric adenocarcinoma, with his disease abutting his pancreas. He comes in today prior to heading into his 4th and final cycle of neoadjuvant FLOT chemotherapy.  Unfortunately, he has had a noticeable decline in his health.  He has had frequent diarrhea.  Furthermore, this gentleman's feeding tube came out.  His wife has seen frequent discharge coming from the opening of where his tube was placed.  His caloric intake has also abruptly fallen.  The patient admits to feeling extremely weak.    PHYSICAL EXAM:  Blood pressure 101/67, pulse (!) 116, resp. rate 20, height 5\' 3"  (1.6 m), SpO2 99 %. Wt Readings from Last 3 Encounters:  11/22/20 139 lb 12 oz (63.4 kg)  11/21/20 140 lb (63.5 kg)  11/08/20 147 lb 8 oz (66.9 kg)   Body mass index is 24.76 kg/m. Performance status (ECOG): 3 Physical Exam Constitutional:      Appearance: He is not ill-appearing.     Comments: The patient is in a wheelchair and looks extremely weak. He does not look particularly ill.  HENT:     Mouth/Throat:     Mouth: Mucous membranes are moist.     Pharynx: Oropharynx is clear. No oropharyngeal exudate or posterior oropharyngeal erythema.  Cardiovascular:     Rate and Rhythm: Normal rate and regular rhythm.     Heart sounds: No murmur heard.   No friction rub. No gallop.  Pulmonary:     Effort: Pulmonary effort is normal. No respiratory distress.     Breath sounds: Normal breath  sounds. No wheezing, rhonchi or rales.  Chest:  Breasts:    Right: No axillary adenopathy or supraclavicular adenopathy.     Left: No axillary adenopathy or supraclavicular adenopathy.  Abdominal:     General: Bowel sounds are normal. There is no distension.     Palpations: Abdomen is soft. There is no mass.     Tenderness: There is no abdominal tenderness.     Comments: No palpable abdominal masses.  Mild discharge from tube opening  Musculoskeletal:        General: No swelling.     Right lower leg: No edema.     Left lower leg: No edema.  Lymphadenopathy:     Cervical: No cervical adenopathy.     Upper Body:     Right upper body: No supraclavicular or axillary adenopathy.     Left upper body: No supraclavicular or axillary adenopathy.     Lower Body: No right inguinal adenopathy. No left inguinal adenopathy.  Skin:    General: Skin is warm.     Coloration: Skin is not jaundiced.     Findings: No lesion or rash.  Neurological:     General: No focal deficit present.     Mental Status: He is alert and oriented to person, place, and time. Mental status is at baseline.     Cranial Nerves: Cranial nerves are intact.  Psychiatric:  Mood and Affect: Mood normal.        Behavior: Behavior normal.        Thought Content: Thought content normal.   LABS:   Ref. Range 12/04/2020 00:00  Sodium Latest Ref Range: 137 - 147  129 (A)  Potassium Latest Ref Range: 3.4 - 5.3  2.9 (A)  Chloride Latest Ref Range: 99 - 108  93 (A)  CO2 Latest Ref Range: 13 - 22  18  Glucose Unknown 145  BUN Latest Ref Range: 4 - 21  21  Creatinine Latest Ref Range: 0.6 - 1.3  0.8  Calcium Latest Ref Range: 8.7 - 10.7  8.4 (A)  Alkaline Phosphatase Latest Ref Range: 25 - 125  83  Albumin Latest Ref Range: 3.5 - 5.0  3.4 (A)  AST Latest Ref Range: 14 - 40  21  ALT Latest Ref Range: 10 - 40  19  Bilirubin, Total Unknown 1.1  WBC Unknown 5.4  RBC Latest Ref Range: 3.87 - 5.11  4.63  Hemoglobin Latest  Ref Range: 13.5 - 17.5  12.7 (A)  HCT Latest Ref Range: 41 - 53  38 (A)  Platelets Latest Ref Range: 150 - 399  144 (A)    ASSESSMENT & PLAN:  A 65 y.o. male with stage IVA (T4b N0 M0) gastric adenocarcinoma. This gentleman is clearly very weak.  My concern is he is severely dehydrated.  As contents are seeping through the opening of his feeding tube, I am concerned contents may also be seeping into his abdomen and potentially setting him up for sepsis.  His 4th cycle of FLOT will definitely be held.  Based upon how he is doing, it likely will be omitted altogether.  This patient will be taken to the emergency room to be assessed.  I would not be surprised if he ultimately gets admitted for dehydration +/- sepsis.  I will tentatively see him back in 3 weeks for repeat clinical assessment.  The patient and his wife understand all the plans discussed today and are in agreement with them.   I, Alexander [name], am acting as scribe for Alexander Potter, MD    I have reviewed this report as typed by the medical scribe, and it is complete and accurate.  [facility] Macarthur Critchley, MD

## 2020-12-03 ENCOUNTER — Telehealth: Payer: Self-pay

## 2020-12-03 NOTE — Telephone Encounter (Signed)
Dr. Bobby Rumpf aware of recommendation.

## 2020-12-04 ENCOUNTER — Telehealth: Payer: Self-pay | Admitting: Oncology

## 2020-12-04 ENCOUNTER — Inpatient Hospital Stay (INDEPENDENT_AMBULATORY_CARE_PROVIDER_SITE_OTHER): Payer: PPO | Admitting: Oncology

## 2020-12-04 ENCOUNTER — Inpatient Hospital Stay: Payer: PPO

## 2020-12-04 ENCOUNTER — Encounter: Payer: Self-pay | Admitting: Oncology

## 2020-12-04 ENCOUNTER — Other Ambulatory Visit: Payer: Self-pay | Admitting: Hematology and Oncology

## 2020-12-04 VITALS — BP 101/67 | HR 116 | Resp 20 | Ht 63.0 in

## 2020-12-04 DIAGNOSIS — R531 Weakness: Secondary | ICD-10-CM | POA: Diagnosis not present

## 2020-12-04 DIAGNOSIS — T50995A Adverse effect of other drugs, medicaments and biological substances, initial encounter: Secondary | ICD-10-CM | POA: Diagnosis not present

## 2020-12-04 DIAGNOSIS — Z5111 Encounter for antineoplastic chemotherapy: Secondary | ICD-10-CM | POA: Diagnosis not present

## 2020-12-04 DIAGNOSIS — T887XXA Unspecified adverse effect of drug or medicament, initial encounter: Secondary | ICD-10-CM | POA: Diagnosis not present

## 2020-12-04 DIAGNOSIS — Z4682 Encounter for fitting and adjustment of non-vascular catheter: Secondary | ICD-10-CM | POA: Diagnosis not present

## 2020-12-04 DIAGNOSIS — R262 Difficulty in walking, not elsewhere classified: Secondary | ICD-10-CM | POA: Diagnosis not present

## 2020-12-04 DIAGNOSIS — E860 Dehydration: Secondary | ICD-10-CM | POA: Diagnosis not present

## 2020-12-04 DIAGNOSIS — C169 Malignant neoplasm of stomach, unspecified: Secondary | ICD-10-CM | POA: Diagnosis not present

## 2020-12-04 DIAGNOSIS — R197 Diarrhea, unspecified: Secondary | ICD-10-CM | POA: Diagnosis not present

## 2020-12-04 LAB — BASIC METABOLIC PANEL
BUN: 21 (ref 4–21)
CO2: 18 (ref 13–22)
Chloride: 93 — AB (ref 99–108)
Creatinine: 0.8 (ref 0.6–1.3)
Glucose: 145
Potassium: 2.9 — AB (ref 3.4–5.3)
Sodium: 129 — AB (ref 137–147)

## 2020-12-04 LAB — CBC: RBC: 4.63 (ref 3.87–5.11)

## 2020-12-04 LAB — HEPATIC FUNCTION PANEL
ALT: 19 (ref 10–40)
AST: 21 (ref 14–40)
Alkaline Phosphatase: 83 (ref 25–125)
Bilirubin, Total: 1.1

## 2020-12-04 LAB — CBC AND DIFFERENTIAL
HCT: 38 — AB (ref 41–53)
Hemoglobin: 12.7 — AB (ref 13.5–17.5)
Platelets: 144 — AB (ref 150–399)
WBC: 5.4

## 2020-12-04 LAB — COMPREHENSIVE METABOLIC PANEL
Albumin: 3.4 — AB (ref 3.5–5.0)
Calcium: 8.4 — AB (ref 8.7–10.7)

## 2020-12-04 MED ORDER — SODIUM CHLORIDE 0.9 % IV SOLN
Freq: Once | INTRAVENOUS | Status: AC
  Filled 2020-12-04: qty 250

## 2020-12-04 MED FILL — Dexamethasone Sodium Phosphate Inj 100 MG/10ML: INTRAMUSCULAR | Qty: 1 | Status: AC

## 2020-12-04 NOTE — Addendum Note (Signed)
Addended byGeorgette Shell on: 12/04/2020 03:30 PM   Modules accepted: Orders

## 2020-12-04 NOTE — Telephone Encounter (Signed)
No LOS Entered 

## 2020-12-05 ENCOUNTER — Inpatient Hospital Stay: Payer: PPO

## 2020-12-09 ENCOUNTER — Encounter: Payer: Self-pay | Admitting: Oncology

## 2020-12-25 ENCOUNTER — Telehealth: Payer: Self-pay | Admitting: Oncology

## 2020-12-25 NOTE — Telephone Encounter (Signed)
Per Tammy, please schedule patient for 8/16 Follow Up w/Dr Bobby Rumpf at 1:30 pm

## 2020-12-25 NOTE — Progress Notes (Signed)
[facility]  [address] [place],  [place]  [address] [phone]  Clinic Day:  12/31/2020  Referring physician: Burney Gauze, MD  This document serves as a record of services personally performed by Tajah Schreiner Macarthur Critchley, MD. It was created on their behalf by [facility] E, a trained medical scribe. The creation of this record is based on the scribe's personal observations and the provider's statements to them.  HISTORY OF PRESENT ILLNESS:  The patient is a 65 y.o. male with stage IVA (T4b N0 M0) gastric adenocarcinoma, with his disease abutting his pancreas. Based upon this, the plan has been to give him neoadjuvant chemotherapy to debulk his tumor in hopes he could undergo surgical resection.  He has received 3 of 4 planned cycles of  neoadjuvant FLOT chemotherapy.  At his last visit, he was noticeably weak, with contents coming out of where his G-tube was previously located.  Scans were done, which fortunately did not show any intra-abdominal infection.  It also showed that his distal stomach also looked better after 3 cycles of neoadjuvant FLOT.  Although the patient is doing better today, he is hesitant to proceed with another cycle of FLOT due to how much the cycles of chemotherapy brought him down.  He is back to eating well.  He has gained nearly 10 pounds over the past few weeks.  He denies having any symptoms which concern him for complications related to his underlying disease.    PHYSICAL EXAM:  Blood pressure 128/87, pulse (!) 107, temperature 98.1 F (36.7 C), resp. rate 16, height '5\' 3"'$  (1.6 m), weight 135 lb 3.2 oz (61.3 kg), SpO2 97 %. Wt Readings from Last 3 Encounters:  12/31/20 135 lb 3.2 oz (61.3 kg)  11/22/20 139 lb 12 oz (63.4 kg)  11/21/20 140 lb (63.5 kg)   Body mass index is 23.95 kg/m. Performance status (ECOG): 3 Physical Exam Constitutional:      Appearance: He is not ill-appearing.     Comments: The patient is using a  walker, but he looks stronger vs previous visits   HENT:     Mouth/Throat:     Mouth: Mucous membranes are moist.     Pharynx: Oropharynx is clear. No oropharyngeal exudate or posterior oropharyngeal erythema.  Cardiovascular:     Rate and Rhythm: Normal rate and regular rhythm.     Heart sounds: No murmur heard.   No friction rub. No gallop.  Pulmonary:     Effort: Pulmonary effort is normal. No respiratory distress.     Breath sounds: Normal breath sounds. No wheezing, rhonchi or rales.  Abdominal:     General: Bowel sounds are normal. There is no distension.     Palpations: Abdomen is soft. There is no mass.     Tenderness: There is no abdominal tenderness.     Comments: No palpable abdominal masses.  Previous G-tube insertion site is now well-healed.  Musculoskeletal:        General: No swelling.     Right lower leg: No edema.     Left lower leg: No edema.  Lymphadenopathy:     Cervical: No cervical adenopathy.     Upper Body:     Right upper body: No supraclavicular or axillary adenopathy.     Left upper body: No supraclavicular or axillary adenopathy.     Lower Body: No right inguinal adenopathy. No left inguinal adenopathy.  Skin:    General: Skin is warm.  Coloration: Skin is not jaundiced.     Findings: No lesion or rash.  Neurological:     General: No focal deficit present.     Mental Status: He is alert and oriented to person, place, and time. Mental status is at baseline.     Cranial Nerves: Cranial nerves are intact.  Psychiatric:        Mood and Affect: Mood normal.        Behavior: Behavior normal.        Thought Content: Thought content normal.   SCANS:  CT scans done in late July 2022 (after 3 cycles of FLOT) revealed the following: FINDINGS: CTA CHEST FINDINGS  Cardiovascular: Preferential opacification of the thoracic aorta. No evidence of thoracic aortic aneurysm or dissection. Normal heart size. No pericardial effusion. No pulmonary embolus is  seen. Calcific aortic and coronary atherosclerosis noted.  Mediastinum/Nodes: No enlarged mediastinal, hilar, or axillary lymph nodes. Thyroid gland, trachea, and esophagus demonstrate no significant findings.  Lungs/Pleura: No pleural effusion. No nodule, mass or consolidative process. Emphysema again seen. Mild dependent atelectasis.  Musculoskeletal: No acute or focal abnormality.  Review of the MIP images confirms the above findings.  CTA ABDOMEN AND PELVIS FINDINGS  VASCULAR  Aorta: Normal caliber aorta without aneurysm, dissection, vasculitis or significant stenosis.  Celiac: Narrowing just distal to the takeoff the are of the artery approximately 50% is unchanged.  SMA: Patent without evidence of aneurysm, dissection, vasculitis or significant stenosis.  Renals: Both renal arteries are patent without evidence of aneurysm, dissection, vasculitis, fibromuscular dysplasia or significant stenosis.  IMA: Patent without evidence of aneurysm, dissection, vasculitis or significant stenosis.  Inflow: Patent without evidence of aneurysm, dissection, vasculitis or significant stenosis.  Veins: No obvious venous abnormality within the limitations of this arterial phase study.  Review of the MIP images confirms the above findings.  NON-VASCULAR  Hepatobiliary: No focal liver abnormality is seen. No gallstones, gallbladder wall thickening, or biliary dilatation. Marked fatty infiltration of the liver again seen.  Pancreas: Unremarkable. No pancreatic ductal dilatation or surrounding inflammatory changes.  Spleen: Normal in size without focal abnormality.  Adrenals/Urinary Tract: Adrenal glands are unremarkable. Kidneys are normal, without renal calculi, focal lesion, or hydronephrosis. Bladder is unremarkable.  Stomach/Bowel: Wall thickening and irregularity of the mucosa of the stomach are most notable distally. Postoperative change seen at the anterior stomach  where there is a side to side anastomosis. No complicating feature.  Tract for a direct jejunostomy feeding tube is seen in the left upper quadrant of the abdomen. There is gas in the subcutaneous soft tissues at the feeding tube insertion site. No focal fluid collection or other complicating feature is identified.  The appendix is not seen. No evidence of appendicitis. No inflammatory change about the small or large bowel is identified. No bowel obstruction or evidence of ischemia.  Lymphatic: Negative.  Reproductive: Prostatomegaly.  Other: None.  Musculoskeletal: No acute or focal abnormality. Schmorl's node in the superior endplate of L2 noted  Review of the MIP images confirms the above findings.  IMPRESSION: Negative for aortic dissection or aneurysm.  Tract for direct jejunostomy feeding tube. No acute abnormality or complicating feature seen.  Postoperative change anterior stomach where there is an anastomosis with small bowel. No complicating feature. Wall thickening and mucosal irregularity of the stomach are worst distally and compatible with the patient's history of gastric carcinoma.  Fatty infiltration of the liver.  No change in approximately 50% narrowing of the celiac axis just distal to  its take-off.  Aortic Atherosclerosis (ICD10-I70.0) and Emphysema (ICD10-J43.9). Calcific coronary artery disease also noted.  ASSESSMENT & PLAN:  A 65 y.o. male with stage IVA (T4b N0 M0) gastric adenocarcinoma.  Although the plan was for this gentleman to receive 4 cycles of neoadjuvant  FLOT, it appears he will be only able to take the 3 he has already completed.  In clinic today, I went over his CT scan images with him, for which the distal aspects of his stomach look better.  However, the major question is whether his tumor has shrunk away from his pancreas to where surgery would be feasible.  I will have him see Dr Zenia Resides tomorrow to look into this further.  The  patient understands if surgery cannot be done, radiation to this area (+/- concurrent chemotherapy) may need to be considered.  I will tentatively see him back in 1 month for repeat clinical assessment.  The patient and his wife understand all the plans discussed today and are in agreement with them.  I, Rita Ohara, am acting as scribe for [name], MD    I have reviewed this report as typed by the medical scribe, and it is complete and accurate.  Jaeline Whobrey Macarthur Critchley, MD

## 2020-12-30 ENCOUNTER — Other Ambulatory Visit: Payer: Self-pay | Admitting: Oncology

## 2020-12-30 DIAGNOSIS — C169 Malignant neoplasm of stomach, unspecified: Secondary | ICD-10-CM

## 2020-12-31 ENCOUNTER — Telehealth: Payer: Self-pay | Admitting: Oncology

## 2020-12-31 ENCOUNTER — Other Ambulatory Visit: Payer: Self-pay | Admitting: Oncology

## 2020-12-31 ENCOUNTER — Inpatient Hospital Stay: Payer: PPO | Attending: Oncology | Admitting: Oncology

## 2020-12-31 ENCOUNTER — Other Ambulatory Visit: Payer: Self-pay

## 2020-12-31 VITALS — BP 128/87 | HR 107 | Temp 98.1°F | Resp 16 | Ht 63.0 in | Wt 135.2 lb

## 2020-12-31 DIAGNOSIS — C169 Malignant neoplasm of stomach, unspecified: Secondary | ICD-10-CM | POA: Diagnosis not present

## 2020-12-31 NOTE — Telephone Encounter (Signed)
Per 8/16 LOS, patient scheduled for Sept Appt's.  Gave patient Appt Summary

## 2021-01-01 DIAGNOSIS — E440 Moderate protein-calorie malnutrition: Secondary | ICD-10-CM | POA: Diagnosis not present

## 2021-01-01 DIAGNOSIS — C164 Malignant neoplasm of pylorus: Secondary | ICD-10-CM | POA: Diagnosis not present

## 2021-01-02 DIAGNOSIS — C164 Malignant neoplasm of pylorus: Secondary | ICD-10-CM | POA: Insufficient documentation

## 2021-01-07 DIAGNOSIS — C169 Malignant neoplasm of stomach, unspecified: Secondary | ICD-10-CM | POA: Diagnosis not present

## 2021-01-07 DIAGNOSIS — E785 Hyperlipidemia, unspecified: Secondary | ICD-10-CM | POA: Diagnosis not present

## 2021-01-07 DIAGNOSIS — F5102 Adjustment insomnia: Secondary | ICD-10-CM | POA: Diagnosis not present

## 2021-01-07 DIAGNOSIS — Z87891 Personal history of nicotine dependence: Secondary | ICD-10-CM | POA: Diagnosis not present

## 2021-01-07 DIAGNOSIS — I10 Essential (primary) hypertension: Secondary | ICD-10-CM | POA: Diagnosis not present

## 2021-01-07 DIAGNOSIS — Z6823 Body mass index (BMI) 23.0-23.9, adult: Secondary | ICD-10-CM | POA: Diagnosis not present

## 2021-01-08 ENCOUNTER — Other Ambulatory Visit: Payer: Self-pay

## 2021-01-08 NOTE — Progress Notes (Signed)
The proposed treatment discussed in conference is for discussion purpose only and is not a binding recommendation.  The patients have not been physically examined, or presented with their treatment options.  Therefore, final treatment plans cannot be decided.  

## 2021-01-22 DIAGNOSIS — C169 Malignant neoplasm of stomach, unspecified: Secondary | ICD-10-CM | POA: Diagnosis not present

## 2021-01-24 NOTE — Progress Notes (Signed)
[facility]  [address] [place],  [place]  [address] [phone]  Clinic Day:  02/10/2021  Referring physician: Burney Gauze, MD  This document serves as a record of services personally performed by Rickesha Veracruz Macarthur Critchley, MD. It was created on their behalf by [facility] E, a trained medical scribe. The creation of this record is based on the scribe's personal observations and the provider's statements to them.  HISTORY OF PRESENT ILLNESS:  The patient is a 65 y.o. male with stage IVA (T4b N0 M0) gastric adenocarcinoma, with his disease abutting his pancreas. Based upon this, the plan was to give him neoadjuvant chemotherapy to debulk his tumor in hopes he could undergo complete surgical resection.  He received 3 of 4 planned cycles of neoadjuvant FLOT chemotherapy.  The significant decline in his health prevented him from receiving his last cycle of FLOT.  He comes in today for routine follow-up.  Overall, he is doing much better.  He is currently being evaluated at [facility] for a possible Whipple procedure, in conjunction with a distal gastrectomy, in hopes of making him disease free.  The patient feels his health has improved to where undergoing such surgery is now possible.  He denies having abdominal pain, early satiety or other symptoms which concern him for complications related to his underlying disease.    PHYSICAL EXAM:  Blood pressure (!) 131/95, pulse 95, temperature 98.5 F (36.9 C), resp. rate 16, height '5\' 3"'$  (1.6 m), weight 134 lb 14.4 oz (61.2 kg), SpO2 97 %. Wt Readings from Last 3 Encounters:  01/31/21 134 lb 14.4 oz (61.2 kg)  12/31/20 135 lb 3.2 oz (61.3 kg)  11/22/20 139 lb 12 oz (63.4 kg)   Body mass index is 23.9 kg/m. Performance status (ECOG): 3 Physical Exam Constitutional:      Appearance: He is not ill-appearing.     Comments: The patient looks stronger vs previous visits   HENT:     Mouth/Throat:     Mouth: Mucous  membranes are moist.     Pharynx: Oropharynx is clear. No oropharyngeal exudate or posterior oropharyngeal erythema.  Cardiovascular:     Rate and Rhythm: Normal rate and regular rhythm.     Heart sounds: No murmur heard.   No friction rub. No gallop.  Pulmonary:     Effort: Pulmonary effort is normal. No respiratory distress.     Breath sounds: Normal breath sounds. No wheezing, rhonchi or rales.  Abdominal:     General: Bowel sounds are normal. There is no distension.     Palpations: Abdomen is soft. There is no mass.     Tenderness: There is no abdominal tenderness.     Comments: No palpable abdominal masses.    Musculoskeletal:        General: No swelling.     Right lower leg: No edema.     Left lower leg: No edema.  Lymphadenopathy:     Cervical: No cervical adenopathy.     Upper Body:     Right upper body: No supraclavicular or axillary adenopathy.     Left upper body: No supraclavicular or axillary adenopathy.     Lower Body: No right inguinal adenopathy. No left inguinal adenopathy.  Skin:    General: Skin is warm.     Coloration: Skin is not jaundiced.     Findings: No lesion or rash.  Neurological:     General: No focal deficit present.     Mental  Status: He is alert and oriented to person, place, and time. Mental status is at baseline.     Cranial Nerves: Cranial nerves are intact.  Psychiatric:        Mood and Affect: Mood normal.        Behavior: Behavior normal.        Thought Content: Thought content normal.    ASSESSMENT & PLAN:  A 65 y.o. male with stage IVA (T4b N0 M0) gastric adenocarcinoma, who received 3 cycles of neoadjuvant  FLOT.  Clinically the patient appears to be doing much better.  He is tentatively scheduled for surgery in early October 2022, which will hopefully make him disease free.  I will see him back 6 weeks after surgery for repeat clinical assessment.  The patient and his wife understand all the plans discussed today and are in agreement  with them.    I, Rita Ohara, am acting as scribe for [name], MD    I have reviewed this report as typed by the medical scribe, and it is complete and accurate.  Diala Waxman Macarthur Critchley, MD

## 2021-01-30 ENCOUNTER — Telehealth: Payer: Self-pay | Admitting: Oncology

## 2021-01-30 NOTE — Telephone Encounter (Signed)
Patient's spouse called to verify 9/16 Appt's

## 2021-01-31 ENCOUNTER — Inpatient Hospital Stay: Payer: PPO | Attending: Oncology

## 2021-01-31 ENCOUNTER — Inpatient Hospital Stay (INDEPENDENT_AMBULATORY_CARE_PROVIDER_SITE_OTHER): Payer: PPO | Admitting: Oncology

## 2021-01-31 ENCOUNTER — Telehealth: Payer: Self-pay | Admitting: Oncology

## 2021-01-31 ENCOUNTER — Other Ambulatory Visit: Payer: Self-pay | Admitting: Hematology and Oncology

## 2021-01-31 ENCOUNTER — Other Ambulatory Visit: Payer: Self-pay | Admitting: Oncology

## 2021-01-31 ENCOUNTER — Encounter: Payer: Self-pay | Admitting: Oncology

## 2021-01-31 VITALS — BP 131/95 | HR 95 | Temp 98.5°F | Resp 16 | Ht 63.0 in | Wt 134.9 lb

## 2021-01-31 DIAGNOSIS — D539 Nutritional anemia, unspecified: Secondary | ICD-10-CM | POA: Diagnosis not present

## 2021-01-31 DIAGNOSIS — C169 Malignant neoplasm of stomach, unspecified: Secondary | ICD-10-CM | POA: Diagnosis not present

## 2021-01-31 DIAGNOSIS — D649 Anemia, unspecified: Secondary | ICD-10-CM | POA: Diagnosis not present

## 2021-01-31 LAB — CORRECTED CALCIUM (CC13): Calcium, Corrected: 9.3

## 2021-01-31 LAB — CBC AND DIFFERENTIAL
HCT: 33 — AB (ref 41–53)
Hemoglobin: 10.9 — AB (ref 13.5–17.5)
Neutrophils Absolute: 7.68
Platelets: 319 (ref 150–399)
WBC: 12

## 2021-01-31 LAB — VITAMIN B12: Vitamin B-12: 359 pg/mL (ref 180–914)

## 2021-01-31 LAB — IRON AND TIBC
Iron: 35 ug/dL — ABNORMAL LOW (ref 45–182)
Saturation Ratios: 11 % — ABNORMAL LOW (ref 17.9–39.5)
TIBC: 325 ug/dL (ref 250–450)
UIBC: 290 ug/dL

## 2021-01-31 LAB — BASIC METABOLIC PANEL
BUN: 14 (ref 4–21)
CO2: 24 — AB (ref 13–22)
Chloride: 101 (ref 99–108)
Creatinine: 0.7 (ref 0.6–1.3)
Glucose: 133
Potassium: 3.7 (ref 3.4–5.3)
Sodium: 134 — AB (ref 137–147)

## 2021-01-31 LAB — FOLATE: Folate: 20.7 ng/mL (ref >5.9)

## 2021-01-31 LAB — CBC
MCV: 93 (ref 80–94)
RBC: 3.54 — AB (ref 3.87–5.11)

## 2021-01-31 LAB — HEPATIC FUNCTION PANEL
ALT: 19 (ref 10–40)
AST: 28 (ref 14–40)
Alkaline Phosphatase: 82 (ref 25–125)
Bilirubin, Total: 0.5

## 2021-01-31 LAB — FERRITIN: Ferritin: 114 ng/mL (ref 24–336)

## 2021-01-31 LAB — COMPREHENSIVE METABOLIC PANEL
Albumin: 3.4 — AB (ref 3.5–5.0)
Calcium: 8.7 (ref 8.7–10.7)

## 2021-01-31 NOTE — Telephone Encounter (Signed)
Per 9/16 LOS, patient scheduled for Nov Appt's. Patient said she would get Appt's from [facility]

## 2021-02-11 ENCOUNTER — Encounter: Payer: Self-pay | Admitting: Hematology and Oncology

## 2021-02-11 ENCOUNTER — Encounter: Payer: Self-pay | Admitting: Oncology

## 2021-02-11 ENCOUNTER — Ambulatory Visit: Payer: Self-pay | Admitting: Surgery

## 2021-02-11 DIAGNOSIS — C164 Malignant neoplasm of pylorus: Secondary | ICD-10-CM | POA: Diagnosis not present

## 2021-02-11 DIAGNOSIS — C169 Malignant neoplasm of stomach, unspecified: Secondary | ICD-10-CM | POA: Diagnosis not present

## 2021-02-11 NOTE — H&P (View-Only) (Signed)
History of Present Illness: Alexander [name] is a 65 y.o. male who is seen today for follow-up of a distal gastric cancer.  Briefly presented back in May with gastric outlet obstruction and was found to have a gastric cancer in the pyloric channel.  He was taken to the operating room for attempted resection but clear margins could not be obtained due to involvement of D1 without a Whipple procedure, this a GJ bypass was performed.  The patient was started on FLOT was stopped after 3 cycles due to intolerance.  He has had a prolonged recovery of his performance status after stopping chemotherapy in July.  He feels like his strength is now significantly improved, and he is walking daily.  He reports he walks 50 feet out to the mailbox and back every day and does not get winded.  He now ambulates without an assistive device.  He is completing his ADLs without difficulty.  He reports a good appetite and has maintained a stable weight since his last visit.  Nutrition labs were sent at his last visit and his prealbumin was normal.       Review of Systems: A complete review of systems was obtained from the patient.  I have reviewed this information and discussed as appropriate with the patient.  See HPI as well for other ROS.       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)  Dehydration  Essential hypertension  Gastric outlet obstruction  Gastric adenocarcinoma (CMS-HCC)     Past Surgical History Past Surgical History: Procedure Laterality Date  APPENDECTOMY      GASTROJEJUNOSTOMY   09/20/2020   Exploratory laparotomy, GJ bypass, feeding jejunostomy      Allergies No Known Allergies    Current Outpatient Medications on File Prior to  Visit Medication Sig Dispense Refill  cephalexin (KEFLEX) 500 MG capsule Take 500 mg by mouth 3 (three) times daily      cloNIDine (CATAPRES-TTS) 0.3 mg/24 hr patch APPLY 1 PATCH TOPICALLY ONTO THE SKIN EVERY FRIDAY      dexAMETHasone (DECADRON) 4 MG tablet TAKE 2 TABLETS BY MOUTH THE MORNING BEFORE AND 2 TABLETS THE MORNING OF CHEMOTHERAPY      nutritional supplements (OSMOLITE 1.5 CAL) 0.06 gram-1.5 kcal/mL Liqd Place 1,200 mLs into feeding tube daily.      ondansetron (ZOFRAN-ODT) 4 MG disintegrating tablet DISSOLVE 1 TABLET IN MOUTH EVERY 8 HOURS AS NEEDED FOR NAUSEA FOR VOMITING      pantoprazole (PROTONIX) 40 MG DR tablet        prochlorperazine (COMPAZINE) 10 MG tablet TAKE 1 TABLET BY MOUTH EVERY 6 HOURS AS NEEDED FOR NAUSEA FOR VOMITING      rosuvastatin (CRESTOR) 10 MG tablet Take 10 mg by mouth once daily      traMADoL (ULTRAM) 50 mg tablet Take 50 mg by mouth every 6 (six) hours as needed       No current facility-administered medications on file prior to visit.     Family History Family History Problem Relation Age of Onset  Stroke Mother    Obesity Father    High blood pressure (Hypertension) Father    Diabetes Father    High blood pressure (Hypertension) Brother  Social History   Tobacco Use Smoking Status Former Smoker Smokeless Tobacco Never Used     Social History Social History    Socioeconomic History  Marital status: Married Tobacco Use  Smoking status: Former Smoker  Smokeless tobacco: Never Used Substance and Sexual Activity  Alcohol use: Not Currently  Drug use: Never      Objective:     Vitals:   02/11/21 0948 Weight: 60.9 kg (134 lb 3.2 oz) Height: 157.5 cm (5\' 2" )   Body mass index is 24.55 kg/m.   Physical Exam Vitals reviewed.  Constitutional:      Appearance: Normal appearance. He is normal weight.  HENT:     Head: Normocephalic and atraumatic.  Eyes:     General: No scleral icterus.    Conjunctiva/sclera:  Conjunctivae normal.  Pulmonary:     Effort: Pulmonary effort is normal. No respiratory distress.  Abdominal:     General: There is no distension.     Palpations: Abdomen is soft.     Tenderness: There is no abdominal tenderness.     Comments: Well-healed surgical scars.  Musculoskeletal:        General: Normal range of motion.     Cervical back: Normal range of motion.  Skin:    General: Skin is warm and dry.  Neurological:     General: No focal deficit present.     Mental Status: He is alert and oriented to person, place, and time.  Psychiatric:        Mood and Affect: Mood normal.        Behavior: Behavior normal.        Thought Content: Thought content normal.              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  We have had multiple discussions regarding the treatment of his cancer.  Although he had a good response on imaging to chemotherapy, I think there is a very high likelihood he will need a Whipple to obtain negative margins.  He has made significant recovery after stopping chemotherapy and now has a good functional status.  I again discussed the planned surgery with him, which will be a staging laparoscopy, and open distal gastrectomy and possible Whipple, and possibly replacement of a feeding jejunostomy tube to optimize his nutrition in the postoperative period.  He agrees to proceed with surgery.  Proceed with surgery as scheduled on 10/6.  Risks and benefits of surgery were extensively discussed.  Michaelle Birks, MD [facility], Hepatobiliary and Pancreatic Surgery 02/11/21 10:58 AM

## 2021-02-11 NOTE — H&P (Signed)
History of Present Illness: Alexander [name] is a 65 y.o. male who is seen today for follow-up of a distal gastric cancer.  Briefly presented back in May with gastric outlet obstruction and was found to have a gastric cancer in the pyloric channel.  He was taken to the operating room for attempted resection but clear margins could not be obtained due to involvement of D1 without a Whipple procedure, this a GJ bypass was performed.  The patient was started on FLOT was stopped after 3 cycles due to intolerance.  He has had a prolonged recovery of his performance status after stopping chemotherapy in July.  He feels like his strength is now significantly improved, and he is walking daily.  He reports he walks 50 feet out to the mailbox and back every day and does not get winded.  He now ambulates without an assistive device.  He is completing his ADLs without difficulty.  He reports a good appetite and has maintained a stable weight since his last visit.  Nutrition labs were sent at his last visit and his prealbumin was normal.       Review of Systems: A complete review of systems was obtained from the patient.  I have reviewed this information and discussed as appropriate with the patient.  See HPI as well for other ROS.       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)  Dehydration  Essential hypertension  Gastric outlet obstruction  Gastric adenocarcinoma (CMS-HCC)     Past Surgical History Past Surgical History: Procedure Laterality Date  APPENDECTOMY      GASTROJEJUNOSTOMY   09/20/2020   Exploratory laparotomy, GJ bypass, feeding jejunostomy      Allergies No Known Allergies    Current Outpatient Medications on File Prior to  Visit Medication Sig Dispense Refill  cephalexin (KEFLEX) 500 MG capsule Take 500 mg by mouth 3 (three) times daily      cloNIDine (CATAPRES-TTS) 0.3 mg/24 hr patch APPLY 1 PATCH TOPICALLY ONTO THE SKIN EVERY FRIDAY      dexAMETHasone (DECADRON) 4 MG tablet TAKE 2 TABLETS BY MOUTH THE MORNING BEFORE AND 2 TABLETS THE MORNING OF CHEMOTHERAPY      nutritional supplements (OSMOLITE 1.5 CAL) 0.06 gram-1.5 kcal/mL Liqd Place 1,200 mLs into feeding tube daily.      ondansetron (ZOFRAN-ODT) 4 MG disintegrating tablet DISSOLVE 1 TABLET IN MOUTH EVERY 8 HOURS AS NEEDED FOR NAUSEA FOR VOMITING      pantoprazole (PROTONIX) 40 MG DR tablet        prochlorperazine (COMPAZINE) 10 MG tablet TAKE 1 TABLET BY MOUTH EVERY 6 HOURS AS NEEDED FOR NAUSEA FOR VOMITING      rosuvastatin (CRESTOR) 10 MG tablet Take 10 mg by mouth once daily      traMADoL (ULTRAM) 50 mg tablet Take 50 mg by mouth every 6 (six) hours as needed       No current facility-administered medications on file prior to visit.     Family History Family History Problem Relation Age of Onset  Stroke Mother    Obesity Father    High blood pressure (Hypertension) Father    Diabetes Father    High blood pressure (Hypertension) Brother  Social History   Tobacco Use Smoking Status Former Smoker Smokeless Tobacco Never Used     Social History Social History    Socioeconomic History  Marital status: Married Tobacco Use  Smoking status: Former Smoker  Smokeless tobacco: Never Used Substance and Sexual Activity  Alcohol use: Not Currently  Drug use: Never      Objective:     Vitals:   02/11/21 0948 Weight: 60.9 kg (134 lb 3.2 oz) Height: 157.5 cm (5\' 2" )   Body mass index is 24.55 kg/m.   Physical Exam Vitals reviewed.  Constitutional:      Appearance: Normal appearance. He is normal weight.  HENT:     Head: Normocephalic and atraumatic.  Eyes:     General: No scleral icterus.    Conjunctiva/sclera:  Conjunctivae normal.  Pulmonary:     Effort: Pulmonary effort is normal. No respiratory distress.  Abdominal:     General: There is no distension.     Palpations: Abdomen is soft.     Tenderness: There is no abdominal tenderness.     Comments: Well-healed surgical scars.  Musculoskeletal:        General: Normal range of motion.     Cervical back: Normal range of motion.  Skin:    General: Skin is warm and dry.  Neurological:     General: No focal deficit present.     Mental Status: He is alert and oriented to person, place, and time.  Psychiatric:        Mood and Affect: Mood normal.        Behavior: Behavior normal.        Thought Content: Thought content normal.              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  We have had multiple discussions regarding the treatment of his cancer.  Although he had a good response on imaging to chemotherapy, I think there is a very high likelihood he will need a Whipple to obtain negative margins.  He has made significant recovery after stopping chemotherapy and now has a good functional status.  I again discussed the planned surgery with him, which will be a staging laparoscopy, and open distal gastrectomy and possible Whipple, and possibly replacement of a feeding jejunostomy tube to optimize his nutrition in the postoperative period.  He agrees to proceed with surgery.  Proceed with surgery as scheduled on 10/6.  Risks and benefits of surgery were extensively discussed.  Michaelle Birks, MD [facility] Surgery General, Hepatobiliary and Pancreatic Surgery 02/11/21 10:58 AM

## 2021-02-17 NOTE — Pre-Procedure Instructions (Signed)
Surgical Instructions    Your procedure is scheduled on Thursday, October 6th.  Report to [facility] Main Entrance "A" at 5:30 A.M., then check in with the Admitting office.  Call this number if you have problems the morning of surgery:  [phone]   If you have any questions prior to your surgery date call [phone]: Open Monday-Friday 8am-4pm    Remember:  Do not eat after midnight the night before your surgery  You may drink clear liquids until 4:30 a.m. the morning of your surgery.   Clear liquids allowed are: Water, Non-Citrus Juices (without pulp), Carbonated Beverages, Clear Tea, Black Coffee Only, and Gatorade.  Patient Instructions  The day of surgery (if you do NOT have diabetes):  Drink TWO (2) Pre-Surgery Clear Ensure by bedtime the night before surgery. Drink ONE (1) Pre-Surgery Clear Ensure by 4:30 a.m. the morning of surgery.   This drinks were given to you during your hospital  pre-op appointment visit. Nothing else to drink after completing the  Pre-Surgery Clear Ensure the morning of surgery.         If you have questions, please contact your surgeon's office.     Take these medicines the morning of surgery with A SIP OF WATER  pantoprazole (PROTONIX)   IF NEEDED: ondansetron (ZOFRAN)  prochlorperazine (COMPAZINE)  As of today, STOP taking any Aspirin (unless otherwise instructed by your surgeon) Aleve, Naproxen, Ibuprofen, Motrin, Advil, Goody's, BC's, all herbal medications, fish oil, and all vitamins.                     Do NOT Smoke (Tobacco/Vaping) or drink Alcohol 24 hours prior to your procedure.  If you use a CPAP at night, you may bring all equipment for your overnight stay.   Contacts, glasses, piercing's, hearing aid's, dentures or partials may not be worn into surgery, please bring cases for these belongings.    For patients admitted to the hospital, discharge time will be determined by your treatment team.   Patients discharged the  day of surgery will not be allowed to drive home, and someone needs to stay with them for 24 hours.  NO VISITORS WILL BE ALLOWED IN PRE-OP WHERE PATIENTS GET READY FOR SURGERY.  ONLY 1 SUPPORT PERSON MAY BE PRESENT IN THE WAITING ROOM WHILE YOU ARE IN SURGERY.  IF YOU ARE TO BE ADMITTED, ONCE YOU ARE IN YOUR ROOM YOU WILL BE ALLOWED TWO (2) VISITORS.  Minor children may have two parents present. Special consideration for safety and communication needs will be reviewed on a case by case basis.   Special instructions:   [place]- Preparing For Surgery  Before surgery, you can play an important role. Because skin is not sterile, your skin needs to be as free of germs as possible. You can reduce the number of germs on your skin by washing with CHG (chlorahexidine gluconate) Soap before surgery.  CHG is an antiseptic cleaner which kills germs and bonds with the skin to continue killing germs even after washing.    Oral Hygiene is also important to reduce your risk of infection.  Remember - BRUSH YOUR TEETH THE MORNING OF SURGERY WITH YOUR REGULAR TOOTHPASTE  Please do not use if you have an allergy to CHG or antibacterial soaps. If your skin becomes reddened/irritated stop using the CHG.  Do not shave (including legs and underarms) for at least 48 hours prior to first CHG shower. It is OK to shave your face.  Please follow these instructions carefully.   Shower the NIGHT BEFORE SURGERY and the MORNING OF SURGERY  If you chose to wash your hair, wash your hair first as usual with your normal shampoo.  After you shampoo, rinse your hair and body thoroughly to remove the shampoo.  Use CHG Soap as you would any other liquid soap. You can apply CHG directly to the skin and wash gently with a scrungie or a clean washcloth.   Apply the CHG Soap to your body ONLY FROM THE NECK DOWN.  Do not use on open wounds or open sores. Avoid contact with your eyes, ears, mouth and genitals (private parts). Wash  Face and genitals (private parts)  with your normal soap.   Wash thoroughly, paying special attention to the area where your surgery will be performed.  Thoroughly rinse your body with warm water from the neck down.  DO NOT shower/wash with your normal soap after using and rinsing off the CHG Soap.  Pat yourself dry with a CLEAN TOWEL.  Wear CLEAN PAJAMAS to bed the night before surgery  Place CLEAN SHEETS on your bed the night before your surgery  DO NOT SLEEP WITH PETS.   Day of Surgery: Shower with CHG soap. Do not wear jewelry. Do not wear lotions, powders, colognes, or deodorant. Do not shave 48 hours prior to surgery.  Men may shave face and neck. Do not bring valuables to the hospital. [facility] is not responsible for any belongings or valuables. Wear Clean/Comfortable clothing the morning of surgery Remember to brush your teeth WITH YOUR REGULAR TOOTHPASTE.   Please read over the following fact sheets that you were given.   3 days prior to your procedure or After your COVID test   You are not required to quarantine however you are required to wear a well-fitting mask when you are out and around people not in your household. If your mask becomes wet or soiled, replace with a new one.   Wash your hands often with soap and water for 20 seconds or clean your hands with an alcohol-based hand sanitizer that contains at least 60% alcohol.   Do not share personal items.   Notify your provider:  o if you are in close contact with someone who has COVID  o or if you develop a fever of 100.4 or greater, sneezing, cough, sore throat, shortness of breath or body aches.

## 2021-02-18 ENCOUNTER — Encounter (HOSPITAL_COMMUNITY)
Admission: RE | Admit: 2021-02-18 | Discharge: 2021-02-18 | Disposition: A | Discharge status: Home / Self Care | Payer: PPO | Source: Ambulatory Visit | Attending: Surgery | Admitting: Surgery

## 2021-02-18 ENCOUNTER — Other Ambulatory Visit: Payer: Self-pay

## 2021-02-18 ENCOUNTER — Encounter (HOSPITAL_COMMUNITY): Payer: Self-pay

## 2021-02-18 DIAGNOSIS — E860 Dehydration: Secondary | ICD-10-CM | POA: Diagnosis not present

## 2021-02-18 DIAGNOSIS — Z87891 Personal history of nicotine dependence: Secondary | ICD-10-CM | POA: Diagnosis not present

## 2021-02-18 DIAGNOSIS — Z01812 Encounter for preprocedural laboratory examination: Secondary | ICD-10-CM | POA: Insufficient documentation

## 2021-02-18 DIAGNOSIS — Z934 Other artificial openings of gastrointestinal tract status: Secondary | ICD-10-CM | POA: Diagnosis not present

## 2021-02-18 DIAGNOSIS — Z20822 Contact with and (suspected) exposure to covid-19: Secondary | ICD-10-CM | POA: Insufficient documentation

## 2021-02-18 DIAGNOSIS — Z9221 Personal history of antineoplastic chemotherapy: Secondary | ICD-10-CM | POA: Diagnosis not present

## 2021-02-18 DIAGNOSIS — Z79899 Other long term (current) drug therapy: Secondary | ICD-10-CM | POA: Diagnosis not present

## 2021-02-18 DIAGNOSIS — K219 Gastro-esophageal reflux disease without esophagitis: Secondary | ICD-10-CM | POA: Diagnosis present

## 2021-02-18 DIAGNOSIS — C164 Malignant neoplasm of pylorus: Secondary | ICD-10-CM | POA: Diagnosis present

## 2021-02-18 DIAGNOSIS — I10 Essential (primary) hypertension: Secondary | ICD-10-CM | POA: Diagnosis present

## 2021-02-18 DIAGNOSIS — G8918 Other acute postprocedural pain: Secondary | ICD-10-CM | POA: Diagnosis not present

## 2021-02-18 DIAGNOSIS — R110 Nausea: Secondary | ICD-10-CM | POA: Diagnosis not present

## 2021-02-18 DIAGNOSIS — Z823 Family history of stroke: Secondary | ICD-10-CM | POA: Diagnosis not present

## 2021-02-18 DIAGNOSIS — K7689 Other specified diseases of liver: Secondary | ICD-10-CM | POA: Diagnosis not present

## 2021-02-18 DIAGNOSIS — K660 Peritoneal adhesions (postprocedural) (postinfection): Secondary | ICD-10-CM | POA: Diagnosis present

## 2021-02-18 DIAGNOSIS — J449 Chronic obstructive pulmonary disease, unspecified: Secondary | ICD-10-CM | POA: Diagnosis present

## 2021-02-18 DIAGNOSIS — C787 Secondary malignant neoplasm of liver and intrahepatic bile duct: Secondary | ICD-10-CM | POA: Diagnosis present

## 2021-02-18 DIAGNOSIS — Z8249 Family history of ischemic heart disease and other diseases of the circulatory system: Secondary | ICD-10-CM | POA: Diagnosis not present

## 2021-02-18 DIAGNOSIS — C169 Malignant neoplasm of stomach, unspecified: Secondary | ICD-10-CM | POA: Diagnosis not present

## 2021-02-18 DIAGNOSIS — Z833 Family history of diabetes mellitus: Secondary | ICD-10-CM | POA: Diagnosis not present

## 2021-02-18 HISTORY — DX: Essential (primary) hypertension: I10

## 2021-02-18 HISTORY — DX: Malignant (primary) neoplasm, unspecified: C80.1

## 2021-02-18 LAB — CBC
HCT: 32.5 % — ABNORMAL LOW (ref 39.0–52.0)
Hemoglobin: 10.7 g/dL — ABNORMAL LOW (ref 13.0–17.0)
MCH: 32.6 pg (ref 26.0–34.0)
MCHC: 32.9 g/dL (ref 30.0–36.0)
MCV: 99.1 fL (ref 80.0–100.0)
Platelets: 318 10*3/uL (ref 150–400)
RBC: 3.28 MIL/uL — ABNORMAL LOW (ref 4.22–5.81)
RDW: 17.3 % — ABNORMAL HIGH (ref 11.5–15.5)
WBC: 9.1 10*3/uL (ref 4.0–10.5)
nRBC: 0 % (ref 0.0–0.2)

## 2021-02-18 LAB — BASIC METABOLIC PANEL
Anion gap: 9 (ref 5–15)
BUN: 12 mg/dL (ref 8–23)
CO2: 23 mmol/L (ref 22–32)
Calcium: 8.7 mg/dL — ABNORMAL LOW (ref 8.9–10.3)
Chloride: 104 mmol/L (ref 98–111)
Creatinine, Ser: 0.7 mg/dL (ref 0.61–1.24)
GFR, Estimated: >60 mL/min (ref >60)
Glucose, Bld: 146 mg/dL — ABNORMAL HIGH (ref 70–99)
Potassium: 3.6 mmol/L (ref 3.5–5.1)
Sodium: 136 mmol/L (ref 135–145)

## 2021-02-18 LAB — PREPARE RBC (CROSSMATCH)

## 2021-02-18 NOTE — Progress Notes (Signed)
PCP - Dr. Serita Grammes Cardiologist - denies  Chest x-ray - 09/12/20-1 view EKG - 09/26/20 Stress Test - 11/25/18 ECHO - denies Cardiac Cath - denies  Sleep Study - denies CPAP - denies  Blood Thinner Instructions: n/a Aspirin Instructions: n/a  ERAS Protcol - Clear liquids until 0430 DOS. PRE-SURGERY Ensure or G2- Pt ordered to have (3) pre-surgical Ensures. Pt is to drink (2) the night before surgery and (1) the day of surgery.  COVID TEST- 02/18/21; done in PAT.   Anesthesia review: Yes  Patient denies shortness of breath, fever, cough and chest pain at PAT appointment   All instructions explained to the patient, with a verbal understanding of the material. Patient agrees to go over the instructions while at home for a better understanding. Patient also instructed to self quarantine after being tested for COVID-19. The opportunity to ask questions was provided.

## 2021-02-19 LAB — SARS CORONAVIRUS 2 (TAT 6-24 HRS): SARS Coronavirus 2: NEGATIVE

## 2021-02-19 NOTE — Anesthesia Preprocedure Evaluation (Addendum)
Anesthesia Evaluation  Patient identified by MRN, date of birth, ID band Patient awake    Reviewed: Allergy & Precautions, NPO status , Patient's Chart, lab work & pertinent test results  Airway Mallampati: I  TM Distance: >3 FB Neck ROM: Full    Dental no notable dental hx. (+) Teeth Intact, Dental Advisory Given   Pulmonary COPD, former smoker,    Pulmonary exam normal breath sounds clear to auscultation       Cardiovascular hypertension, Pt. on medications Normal cardiovascular exam Rhythm:Regular Rate:Normal  CV: Nuclear stress test 11/25/19 [facility]; report found in [facility]):  1. No reversible ischemia or infarction. 2. Normal left ventricular wall motion. 3. Left ventricular ejection fraction 70% 4. Non invasive risk stratification*: Low  Echo 11/25/2019 [facility]): 1. There is overall borderline concentric LVH 2. Overall LV systolic function is normal with an EF between 60-65% 3.  Diastolic filling pattern indicates impaired relaxation 4.  LA normal in size 5.  Mild aortic valve sclerosis   Neuro/Psych negative neurological ROS  negative psych ROS   GI/Hepatic Neg liver ROS, GERD  Controlled and Medicated,  Endo/Other  negative endocrine ROS  Renal/GU negative Renal ROS  negative genitourinary   Musculoskeletal negative musculoskeletal ROS (+)   Abdominal   Peds  Hematology negative hematology ROS (+)   Anesthesia Other Findings Gastric CA  Reproductive/Obstetrics                           Anesthesia Physical Anesthesia Plan  ASA: 2  Anesthesia Plan: General and Regional   Post-op Pain Management:  Regional for Post-op pain   Induction: Intravenous  PONV Risk Score and Plan: 2 and Midazolam, Dexamethasone and Ondansetron  Airway Management Planned: Oral ETT  Additional Equipment: Arterial line  Intra-op Plan:   Post-operative Plan: Extubation in  OR  Informed Consent: I have reviewed the patients History and Physical, chart, labs and discussed the procedure including the risks, benefits and alternatives for the proposed anesthesia with the patient or authorized representative who has indicated his/her understanding and acceptance.     Dental advisory given  Plan Discussed with: CRNA  Anesthesia Plan Comments: (See APP note by Durel Salts, FNP )      Anesthesia Quick Evaluation

## 2021-02-19 NOTE — Progress Notes (Signed)
Anesthesia Chart Review:   Case: [id number] Date/Time: 02/20/21 0715   Procedures:      WHIPPLE PROCEDURE     STAGING LAPAROSCOPY   Anesthesia type: General   Pre-op diagnosis: gastric cancer   Location: [facility] OR ROOM 02 / [place] [facility]   Surgeons: Dwan Bolt, MD       DISCUSSION: Pt is 65 years old with hx HTN, COPD, gastric cancer  VS: BP (!) 144/104   Pulse 83   Temp 36.6 C (Oral)   Resp 17   Ht 5\' 2"  (1.575 m)   Wt 61.9 kg   SpO2 99%   BMI 24.95 kg/m   BP recheck: 131/95  PROVIDERS: - PCP is Serita Grammes, MD   LABS: Labs reviewed: Acceptable for surgery. (all labs ordered are listed, but only abnormal results are displayed)  Labs Reviewed  CBC - Abnormal; Notable for the following components:      Result Value   RBC 3.28 (*)    Hemoglobin 10.7 (*)    HCT 32.5 (*)    RDW 17.3 (*)    All other components within normal limits  BASIC METABOLIC PANEL - Abnormal; Notable for the following components:   Glucose, Bld 146 (*)    Calcium 8.7 (*)    All other components within normal limits  SARS CORONAVIRUS 2 (TAT 6-24 HRS)  TYPE AND SCREEN  PREPARE RBC (CROSSMATCH)     IMAGES: 1 view CXR 09/13/20:  - Nasogastric tube tip and side port in stomach. Lungs clear.  - Cardiac silhouette within normal limits  EKG 09/26/20: NSR   CV: Nuclear stress test [facility]/19 [facility]; report found in [facility] system):  1. No reversible ischemia or infarction. 2. Normal left ventricular wall motion. 3. Left ventricular ejection fraction 70% 4. Non invasive risk stratification*: Low  Echo 11/25/2019 [facility]): 1. There is overall borderline concentric LVH 2. Overall LV systolic function is normal with an EF between 60-65% 3.  Diastolic filling pattern indicates impaired relaxation 4.  LA normal in size 5.  Mild aortic valve sclerosis   Past Medical History:  Diagnosis Date   Cancer ([place])    gastric cancer   COPD (chronic obstructive pulmonary disease) ([place])     GERD without esophagitis 01/04/2020   [facility]   Hypertension     Past Surgical History:  Procedure Laterality Date   APPENDECTOMY     GASTROJEJUNOSTOMY  09/20/2020   Procedure: GASTROJEJUNOSTOMY;  Surgeon: Dwan Bolt, MD;  Location: [facility];  Service: General;;   HERNIA REPAIR     age 54 or 2    IR IMAGING GUIDED PORT INSERTION  10/01/2020   JEJUNOSTOMY N/A 09/20/2020   Procedure: FEEDING JEJUNOSTOMY;  Surgeon: Dwan Bolt, MD;  Location: [place];  Service: General;  Laterality: N/A;    MEDICATIONS:  dexamethasone (DECADRON) 4 MG tablet   cloNIDine (CATAPRES - DOSED IN MG/24 HR) 0.3 mg/24hr patch   feeding supplement (ENSURE ENLIVE / ENSURE PLUS) LIQD   Nutritional Supplements (FEEDING SUPPLEMENT, OSMOLITE 1.5 CAL,) LIQD   Nutritional Supplements (FEEDING SUPPLEMENT, PROSOURCE TF,) liquid   ondansetron (ZOFRAN ODT) 4 MG disintegrating tablet   ondansetron (ZOFRAN) 4 MG tablet   pantoprazole (PROTONIX) 40 MG tablet   prochlorperazine (COMPAZINE) 10 MG tablet   rosuvastatin (CRESTOR) 10 MG tablet   traZODone (DESYREL) 100 MG tablet   Water For Irrigation, Sterile (FREE WATER) SOLN   No current facility-administered medications for this encounter.  If no changes, I anticipate pt can proceed with surgery as scheduled.   Willeen Cass, PhD, FNP-BC [facility] Stay Surgical [facility]/Anesthesiology Phone: [phone] 02/19/2021 3:01 PM

## 2021-02-20 ENCOUNTER — Encounter (HOSPITAL_COMMUNITY)
Admission: RE | Disposition: A | Discharge status: Home / Self Care | Payer: Self-pay | Source: Home / Self Care | Attending: Surgery

## 2021-02-20 ENCOUNTER — Other Ambulatory Visit: Payer: Self-pay

## 2021-02-20 ENCOUNTER — Inpatient Hospital Stay (HOSPITAL_COMMUNITY): Payer: PPO | Admitting: Emergency Medicine

## 2021-02-20 ENCOUNTER — Inpatient Hospital Stay (HOSPITAL_COMMUNITY)
Admission: RE | Admit: 2021-02-20 | Discharge: 2021-02-22 | DRG: 336 | Disposition: A | Discharge status: Home / Self Care | Payer: PPO | Attending: Surgery | Admitting: Surgery

## 2021-02-20 ENCOUNTER — Encounter (HOSPITAL_COMMUNITY): Payer: Self-pay | Admitting: Surgery

## 2021-02-20 DIAGNOSIS — Z823 Family history of stroke: Secondary | ICD-10-CM

## 2021-02-20 DIAGNOSIS — Z20822 Contact with and (suspected) exposure to covid-19: Secondary | ICD-10-CM | POA: Diagnosis present

## 2021-02-20 DIAGNOSIS — I10 Essential (primary) hypertension: Secondary | ICD-10-CM | POA: Diagnosis present

## 2021-02-20 DIAGNOSIS — Z833 Family history of diabetes mellitus: Secondary | ICD-10-CM | POA: Diagnosis not present

## 2021-02-20 DIAGNOSIS — K660 Peritoneal adhesions (postprocedural) (postinfection): Secondary | ICD-10-CM | POA: Diagnosis present

## 2021-02-20 DIAGNOSIS — C787 Secondary malignant neoplasm of liver and intrahepatic bile duct: Secondary | ICD-10-CM | POA: Diagnosis present

## 2021-02-20 DIAGNOSIS — K219 Gastro-esophageal reflux disease without esophagitis: Secondary | ICD-10-CM | POA: Diagnosis present

## 2021-02-20 DIAGNOSIS — Z934 Other artificial openings of gastrointestinal tract status: Secondary | ICD-10-CM | POA: Diagnosis not present

## 2021-02-20 DIAGNOSIS — Z8249 Family history of ischemic heart disease and other diseases of the circulatory system: Secondary | ICD-10-CM | POA: Diagnosis not present

## 2021-02-20 DIAGNOSIS — Z87891 Personal history of nicotine dependence: Secondary | ICD-10-CM | POA: Diagnosis not present

## 2021-02-20 DIAGNOSIS — J449 Chronic obstructive pulmonary disease, unspecified: Secondary | ICD-10-CM | POA: Diagnosis present

## 2021-02-20 DIAGNOSIS — Z9221 Personal history of antineoplastic chemotherapy: Secondary | ICD-10-CM | POA: Diagnosis not present

## 2021-02-20 DIAGNOSIS — C164 Malignant neoplasm of pylorus: Secondary | ICD-10-CM | POA: Diagnosis present

## 2021-02-20 DIAGNOSIS — Z79899 Other long term (current) drug therapy: Secondary | ICD-10-CM

## 2021-02-20 DIAGNOSIS — C169 Malignant neoplasm of stomach, unspecified: Secondary | ICD-10-CM | POA: Diagnosis present

## 2021-02-20 DIAGNOSIS — R110 Nausea: Secondary | ICD-10-CM | POA: Diagnosis not present

## 2021-02-20 HISTORY — PX: LAPAROSCOPY: SHX197

## 2021-02-20 HISTORY — PX: LAPAROTOMY: SHX154

## 2021-02-20 HISTORY — PX: LIVER BIOPSY: SHX301

## 2021-02-20 SURGERY — LAPAROSCOPY, DIAGNOSTIC
Anesthesia: Regional | Site: Abdomen

## 2021-02-20 MED ORDER — PANTOPRAZOLE SODIUM 40 MG PO TBEC
40.0000 mg | DELAYED_RELEASE_TABLET | Freq: Two times a day (BID) | ORAL | Status: DC
  Administered 2021-02-20 – 2021-02-22 (×4): 40 mg via ORAL
  Filled 2021-02-20 (×4): qty 1

## 2021-02-20 MED ORDER — TRAZODONE HCL 100 MG PO TABS: 100.0000 mg | ORAL_TABLET | Freq: Every evening | ORAL | Status: DC | PRN

## 2021-02-20 MED ORDER — 0.9 % SODIUM CHLORIDE (POUR BTL) OPTIME
TOPICAL | Status: DC | PRN
  Administered 2021-02-20 (×3): 1000 mL

## 2021-02-20 MED ORDER — BUPIVACAINE-EPINEPHRINE (PF) 0.25% -1:200000 IJ SOLN
INTRAMUSCULAR | Status: AC
  Filled 2021-02-20: qty 30

## 2021-02-20 MED ORDER — LIDOCAINE 2% (20 MG/ML) 5 ML SYRINGE
INTRAMUSCULAR | Status: AC
  Filled 2021-02-20: qty 5

## 2021-02-20 MED ORDER — PHENYLEPHRINE HCL-NACL 20-0.9 MG/250ML-% IV SOLN
INTRAVENOUS | Status: DC | PRN
  Administered 2021-02-20: 40 ug/min via INTRAVENOUS

## 2021-02-20 MED ORDER — ENOXAPARIN SODIUM 40 MG/0.4ML IJ SOSY
40.0000 mg | PREFILLED_SYRINGE | INTRAMUSCULAR | Status: DC
  Administered 2021-02-21 – 2021-02-22 (×2): 40 mg via SUBCUTANEOUS
  Filled 2021-02-20 (×2): qty 0.4

## 2021-02-20 MED ORDER — DEXAMETHASONE SODIUM PHOSPHATE 10 MG/ML IJ SOLN
INTRAMUSCULAR | Status: DC | PRN
  Administered 2021-02-20: 10 mg via INTRAVENOUS

## 2021-02-20 MED ORDER — ACETAMINOPHEN 500 MG PO TABS
1000.0000 mg | ORAL_TABLET | Freq: Once | ORAL | Status: AC
  Administered 2021-02-20: 1000 mg via ORAL
  Filled 2021-02-20: qty 2

## 2021-02-20 MED ORDER — PHENYLEPHRINE 40 MCG/ML (10ML) SYRINGE FOR IV PUSH (FOR BLOOD PRESSURE SUPPORT)
PREFILLED_SYRINGE | INTRAVENOUS | Status: AC
  Filled 2021-02-20: qty 10

## 2021-02-20 MED ORDER — PHENYLEPHRINE 40 MCG/ML (10ML) SYRINGE FOR IV PUSH (FOR BLOOD PRESSURE SUPPORT)
PREFILLED_SYRINGE | INTRAVENOUS | Status: DC | PRN
  Administered 2021-02-20: 160 ug via INTRAVENOUS

## 2021-02-20 MED ORDER — MIDAZOLAM HCL 2 MG/2ML IJ SOLN
INTRAMUSCULAR | Status: AC
  Filled 2021-02-20: qty 2

## 2021-02-20 MED ORDER — PROPOFOL 10 MG/ML IV BOLUS
INTRAVENOUS | Status: DC | PRN
  Administered 2021-02-20: 150 mg via INTRAVENOUS

## 2021-02-20 MED ORDER — LACTATED RINGERS IV SOLN: INTRAVENOUS | Status: DC

## 2021-02-20 MED ORDER — FENTANYL CITRATE (PF) 250 MCG/5ML IJ SOLN
INTRAMUSCULAR | Status: DC | PRN
  Administered 2021-02-20 (×4): 50 ug via INTRAVENOUS
  Administered 2021-02-20: 25 ug via INTRAVENOUS
  Administered 2021-02-20 (×2): 50 ug via INTRAVENOUS
  Administered 2021-02-20: 25 ug via INTRAVENOUS

## 2021-02-20 MED ORDER — HYDROMORPHONE HCL 1 MG/ML IJ SOLN
0.5000 mg | INTRAMUSCULAR | Status: DC | PRN
Start: 2021-02-20 — End: 2021-02-21

## 2021-02-20 MED ORDER — MIDAZOLAM HCL 2 MG/2ML IJ SOLN
INTRAMUSCULAR | Status: DC | PRN
  Administered 2021-02-20 (×2): 1 mg via INTRAVENOUS

## 2021-02-20 MED ORDER — LIDOCAINE 2% (20 MG/ML) 5 ML SYRINGE
INTRAMUSCULAR | Status: DC | PRN
  Administered 2021-02-20: 20 mg via INTRAVENOUS

## 2021-02-20 MED ORDER — ROSUVASTATIN CALCIUM 5 MG PO TABS
10.0000 mg | ORAL_TABLET | Freq: Every day | ORAL | Status: DC
  Administered 2021-02-21 – 2021-02-22 (×2): 10 mg via ORAL
  Filled 2021-02-20 (×2): qty 2

## 2021-02-20 MED ORDER — ONDANSETRON HCL 4 MG/2ML IJ SOLN
INTRAMUSCULAR | Status: AC
  Filled 2021-02-20: qty 2

## 2021-02-20 MED ORDER — ONDANSETRON 4 MG PO TBDP: 4.0000 mg | ORAL_TABLET | Freq: Four times a day (QID) | ORAL | Status: DC | PRN

## 2021-02-20 MED ORDER — ONDANSETRON HCL 4 MG/2ML IJ SOLN
4.0000 mg | Freq: Four times a day (QID) | INTRAMUSCULAR | Status: DC | PRN
  Administered 2021-02-21: 4 mg via INTRAVENOUS
  Filled 2021-02-20: qty 2

## 2021-02-20 MED ORDER — DOCUSATE SODIUM 100 MG PO CAPS
100.0000 mg | ORAL_CAPSULE | Freq: Two times a day (BID) | ORAL | Status: DC
  Administered 2021-02-20 – 2021-02-22 (×4): 100 mg via ORAL
  Filled 2021-02-20 (×4): qty 1

## 2021-02-20 MED ORDER — CHLORHEXIDINE GLUCONATE CLOTH 2 % EX PADS
6.0000 | MEDICATED_PAD | Freq: Every day | CUTANEOUS | Status: DC
  Administered 2021-02-20 – 2021-02-22 (×3): 6 via TOPICAL

## 2021-02-20 MED ORDER — SUGAMMADEX SODIUM 200 MG/2ML IV SOLN
INTRAVENOUS | Status: DC | PRN
  Administered 2021-02-20: 200 mg via INTRAVENOUS

## 2021-02-20 MED ORDER — EPHEDRINE SULFATE-NACL 50-0.9 MG/10ML-% IV SOSY
PREFILLED_SYRINGE | INTRAVENOUS | Status: DC | PRN
  Administered 2021-02-20 (×2): 5 mg via INTRAVENOUS

## 2021-02-20 MED ORDER — CHLORHEXIDINE GLUCONATE 0.12 % MT SOLN
15.0000 mL | Freq: Once | OROMUCOSAL | Status: AC
  Administered 2021-02-20: 15 mL via OROMUCOSAL
  Filled 2021-02-20: qty 15

## 2021-02-20 MED ORDER — OXYCODONE HCL 5 MG PO TABS
ORAL_TABLET | ORAL | Status: AC
  Filled 2021-02-20: qty 2

## 2021-02-20 MED ORDER — FENTANYL CITRATE (PF) 100 MCG/2ML IJ SOLN
INTRAMUSCULAR | Status: AC
  Filled 2021-02-20: qty 2

## 2021-02-20 MED ORDER — FENTANYL CITRATE (PF) 250 MCG/5ML IJ SOLN
INTRAMUSCULAR | Status: AC
  Filled 2021-02-20: qty 5

## 2021-02-20 MED ORDER — ROCURONIUM BROMIDE 10 MG/ML (PF) SYRINGE
PREFILLED_SYRINGE | INTRAVENOUS | Status: DC | PRN
  Administered 2021-02-20: 60 mg via INTRAVENOUS

## 2021-02-20 MED ORDER — LABETALOL HCL 5 MG/ML IV SOLN: 10.0000 mg | INTRAVENOUS | Status: DC | PRN

## 2021-02-20 MED ORDER — KETOROLAC TROMETHAMINE 15 MG/ML IJ SOLN
INTRAMUSCULAR | Status: AC
  Filled 2021-02-20: qty 1

## 2021-02-20 MED ORDER — PROPOFOL 10 MG/ML IV BOLUS
INTRAVENOUS | Status: AC
  Filled 2021-02-20: qty 20

## 2021-02-20 MED ORDER — KETOROLAC TROMETHAMINE 15 MG/ML IJ SOLN
15.0000 mg | Freq: Four times a day (QID) | INTRAMUSCULAR | Status: DC
Start: 2021-02-20 — End: 2021-02-22
  Administered 2021-02-20 – 2021-02-22 (×9): 15 mg via INTRAVENOUS
  Filled 2021-02-20 (×7): qty 1

## 2021-02-20 MED ORDER — ENSURE PRE-SURGERY PO LIQD: 296.0000 mL | Freq: Once | ORAL | Status: DC

## 2021-02-20 MED ORDER — FENTANYL CITRATE (PF) 100 MCG/2ML IJ SOLN
25.0000 ug | INTRAMUSCULAR | Status: DC | PRN
  Administered 2021-02-20 (×3): 50 ug via INTRAVENOUS

## 2021-02-20 MED ORDER — ORAL CARE MOUTH RINSE: 15.0000 mL | Freq: Once | OROMUCOSAL | Status: AC

## 2021-02-20 MED ORDER — ONDANSETRON HCL 4 MG/2ML IJ SOLN
INTRAMUSCULAR | Status: DC | PRN
  Administered 2021-02-20: 4 mg via INTRAVENOUS

## 2021-02-20 MED ORDER — DEXAMETHASONE SODIUM PHOSPHATE 10 MG/ML IJ SOLN
INTRAMUSCULAR | Status: AC
  Filled 2021-02-20: qty 1

## 2021-02-20 MED ORDER — METRONIDAZOLE 500 MG/100ML IV SOLN
500.0000 mg | INTRAVENOUS | Status: AC
  Administered 2021-02-20: 500 mg via INTRAVENOUS
  Filled 2021-02-20: qty 100

## 2021-02-20 MED ORDER — OXYCODONE HCL 5 MG PO TABS
5.0000 mg | ORAL_TABLET | ORAL | Status: DC | PRN
  Administered 2021-02-20 – 2021-02-22 (×6): 10 mg via ORAL
  Filled 2021-02-20 (×4): qty 2

## 2021-02-20 MED ORDER — DIPHENHYDRAMINE HCL 50 MG/ML IJ SOLN: 12.5000 mg | Freq: Four times a day (QID) | INTRAMUSCULAR | Status: DC | PRN

## 2021-02-20 MED ORDER — ACETAMINOPHEN 500 MG PO TABS
1000.0000 mg | ORAL_TABLET | Freq: Three times a day (TID) | ORAL | Status: DC
  Administered 2021-02-20 – 2021-02-22 (×5): 1000 mg via ORAL
  Filled 2021-02-20 (×5): qty 2

## 2021-02-20 MED ORDER — EPHEDRINE 5 MG/ML INJ
INTRAVENOUS | Status: AC
  Filled 2021-02-20: qty 5

## 2021-02-20 MED ORDER — CEFAZOLIN SODIUM-DEXTROSE 2-4 GM/100ML-% IV SOLN
2.0000 g | INTRAVENOUS | Status: AC
  Administered 2021-02-20: 2 g via INTRAVENOUS
  Filled 2021-02-20: qty 100

## 2021-02-20 MED ORDER — ENSURE PRE-SURGERY PO LIQD: 592.0000 mL | Freq: Once | ORAL | Status: DC

## 2021-02-20 MED ORDER — LACTATED RINGERS IV SOLN: INTRAVENOUS | Status: DC | PRN

## 2021-02-20 MED ORDER — ROCURONIUM BROMIDE 10 MG/ML (PF) SYRINGE
PREFILLED_SYRINGE | INTRAVENOUS | Status: AC
  Filled 2021-02-20: qty 10

## 2021-02-20 MED ORDER — DIPHENHYDRAMINE HCL 12.5 MG/5ML PO ELIX: 12.5000 mg | ORAL_SOLUTION | Freq: Four times a day (QID) | ORAL | Status: DC | PRN

## 2021-02-20 SURGICAL SUPPLY — 112 items
BAG BILE T-TUBES STRL (MISCELLANEOUS) IMPLANT
BAG COUNTER SPONGE SURGICOUNT (BAG) ×3 IMPLANT
BAG DRAINAGE 600ML DEPOT (BAG) IMPLANT
BIOPATCH RED 1 DISK 7.0 (GAUZE/BANDAGES/DRESSINGS) ×3 IMPLANT
BLADE CLIPPER SURG (BLADE) IMPLANT
BOOT SUTURE AID YELLOW STND (SUTURE) ×6 IMPLANT
CANISTER SUCT 3000ML PPV (MISCELLANEOUS) ×3 IMPLANT
CHLORAPREP W/TINT 26 (MISCELLANEOUS) ×3 IMPLANT
CLIP TI LARGE 6 (CLIP) ×3 IMPLANT
CLIP TI MEDIUM 24 (CLIP) ×3 IMPLANT
CLIP TI WIDE RED SMALL 24 (CLIP) ×3 IMPLANT
CLIP VESOCCLUDE LG 6/CT (CLIP) IMPLANT
CNTNR URN SCR LID CUP LEK RST (MISCELLANEOUS) IMPLANT
CONT SPEC 4OZ STRL OR WHT (MISCELLANEOUS)
COUNTER NEEDLE 20 DBL MAG RED (NEEDLE) IMPLANT
COVER MAYO STAND STRL (DRAPES) IMPLANT
COVER SURGICAL LIGHT HANDLE (MISCELLANEOUS) ×3 IMPLANT
DERMABOND ADVANCED (GAUZE/BANDAGES/DRESSINGS) ×1
DERMABOND ADVANCED .7 DNX12 (GAUZE/BANDAGES/DRESSINGS) ×2 IMPLANT
DRAIN CHANNEL 19F RND (DRAIN) IMPLANT
DRAIN PENROSE 0.5X18 (DRAIN) IMPLANT
DRAPE INCISE IOBAN 66X45 STRL (DRAPES) ×3 IMPLANT
DRAPE LAPAROSCOPIC ABDOMINAL (DRAPES) ×3 IMPLANT
DRAPE WARM FLUID 44X44 (DRAPES) ×6 IMPLANT
DRSG COVADERM PLUS 2X2 (GAUZE/BANDAGES/DRESSINGS) IMPLANT
DRSG TEGADERM 4X4.75 (GAUZE/BANDAGES/DRESSINGS) ×3 IMPLANT
DRSG TELFA 3X8 NADH (GAUZE/BANDAGES/DRESSINGS) ×3 IMPLANT
ELECT BLADE 4.0 EZ CLEAN MEGAD (MISCELLANEOUS) ×3
ELECT BLADE 6.5 EXT (BLADE) ×3 IMPLANT
ELECT CAUTERY BLADE 6.4 (BLADE) ×3 IMPLANT
ELECT NEEDLE BLADE 2-5/6 (NEEDLE) ×3 IMPLANT
ELECT PAD DSPR THERM+ ADLT (MISCELLANEOUS) ×3 IMPLANT
ELECT REM PT RETURN 9FT ADLT (ELECTROSURGICAL) ×3
ELECTRODE BLDE 4.0 EZ CLN MEGD (MISCELLANEOUS) ×2 IMPLANT
ELECTRODE REM PT RTRN 9FT ADLT (ELECTROSURGICAL) ×2 IMPLANT
EVACUATOR SILICONE 100CC (DRAIN) IMPLANT
GAUZE 4X4 16PLY ~~LOC~~+RFID DBL (SPONGE) ×3 IMPLANT
GAUZE SPONGE 4X4 12PLY STRL (GAUZE/BANDAGES/DRESSINGS) IMPLANT
GEL ULTRASOUND 20GR AQUASONIC (MISCELLANEOUS) IMPLANT
GLOVE SURG POLY MICRO LF SZ5.5 (GLOVE) ×3 IMPLANT
GLOVE SURG SYN 5.5 (GLOVE) ×6 IMPLANT
GLOVE SURG UNDER POLY LF SZ6 (GLOVE) ×3 IMPLANT
GOWN STRL REUS W/ TWL LRG LVL3 (GOWN DISPOSABLE) ×10 IMPLANT
GOWN STRL REUS W/TWL LRG LVL3 (GOWN DISPOSABLE) ×5
HAND PENCIL TRP OPTION (MISCELLANEOUS) IMPLANT
HANDLE SUCTION POOLE (INSTRUMENTS) ×2 IMPLANT
HEMOSTAT SNOW SURGICEL 2X4 (HEMOSTASIS) IMPLANT
HEMOSTAT SURGICEL 2X14 (HEMOSTASIS) IMPLANT
KIT BASIN OR (CUSTOM PROCEDURE TRAY) ×3 IMPLANT
KIT TUBE JEJUNAL 16FR (CATHETERS) IMPLANT
KIT TURNOVER KIT B (KITS) ×3 IMPLANT
L-HOOK LAP DISP 36CM (ELECTROSURGICAL) ×3
LHOOK LAP DISP 36CM (ELECTROSURGICAL) ×2 IMPLANT
LIGASURE IMPACT 36 18CM CVD LR (INSTRUMENTS) IMPLANT
LOOP VESSEL MAXI BLUE (MISCELLANEOUS) ×3 IMPLANT
LOOP VESSEL MINI RED (MISCELLANEOUS) ×3 IMPLANT
MARKER SKIN DUAL TIP RULER LAB (MISCELLANEOUS) ×3 IMPLANT
NEEDLE INSUFFLATION 14GA 120MM (NEEDLE) ×3 IMPLANT
NS IRRIG 1000ML POUR BTL (IV SOLUTION) ×6 IMPLANT
PACK GENERAL/GYN (CUSTOM PROCEDURE TRAY) IMPLANT
PAD ARMBOARD 7.5X6 YLW CONV (MISCELLANEOUS) ×6 IMPLANT
PENCIL SMOKE EVACUATOR (MISCELLANEOUS) ×3 IMPLANT
RELOAD PROXIMATE 75MM BLUE (ENDOMECHANICALS) IMPLANT
RELOAD PROXIMATE 75MM GREEN (ENDOMECHANICALS) IMPLANT
RETRACTOR WND ALEXIS 25 LRG (MISCELLANEOUS) IMPLANT
RETRACTOR WOUND ALXS 34CM XLRG (MISCELLANEOUS) ×2 IMPLANT
RTRCTR WOUND ALEXIS 25CM LRG (MISCELLANEOUS)
RTRCTR WOUND ALEXIS 34CM XLRG (MISCELLANEOUS) ×3
SCISSORS LAP 5X35 DISP (ENDOMECHANICALS) ×3 IMPLANT
SET IRRIG TUBING LAPAROSCOPIC (IRRIGATION / IRRIGATOR) IMPLANT
SET TUBE SMOKE EVAC HIGH FLOW (TUBING) ×3 IMPLANT
SHEARS FOC LG CVD HARMONIC 17C (MISCELLANEOUS) ×3 IMPLANT
SLEEVE ENDOPATH XCEL 5M (ENDOMECHANICALS) ×3 IMPLANT
SPONGE INTESTINAL PEANUT (DISPOSABLE) IMPLANT
SPONGE SURGIFOAM ABS GEL 100 (HEMOSTASIS) IMPLANT
SPONGE T-LAP 18X18 ~~LOC~~+RFID (SPONGE) ×6 IMPLANT
STAPLER PROXIMATE 75MM BLUE (STAPLE) IMPLANT
STAPLER VISISTAT 35W (STAPLE) IMPLANT
SUCTION POOLE HANDLE (INSTRUMENTS) ×3
SUT ETHILON 2 0 FS 18 (SUTURE) IMPLANT
SUT ETHILON 2 LR (SUTURE) IMPLANT
SUT MNCRL AB 4-0 PS2 18 (SUTURE) ×3 IMPLANT
SUT PDS AB 1 TP1 96 (SUTURE) ×6 IMPLANT
SUT PDS AB 3-0 SH 27 (SUTURE) IMPLANT
SUT PDS AB 4-0 RB1 27 (SUTURE) IMPLANT
SUT PDS II 5-0 RB-2 VIOLET (SUTURE) IMPLANT
SUT PROLENE 3 0 SH 48 (SUTURE) ×9 IMPLANT
SUT PROLENE 4 0 RB 1 (SUTURE) ×3
SUT PROLENE 4-0 RB1 .5 CRCL 36 (SUTURE) ×6 IMPLANT
SUT SILK 2 0 TIES 10X30 (SUTURE) ×3 IMPLANT
SUT SILK 2 0SH CR/8 30 (SUTURE) ×3 IMPLANT
SUT SILK 3 0 TIES 10X30 (SUTURE) ×3 IMPLANT
SUT SILK 3 0SH CR/8 30 (SUTURE) IMPLANT
SUT VIC AB 2-0 CT1 27 (SUTURE)
SUT VIC AB 2-0 CT1 TAPERPNT 27 (SUTURE) IMPLANT
SUT VIC AB 2-0 SH 18 (SUTURE) IMPLANT
SUT VIC AB 3-0 MH 27 (SUTURE) IMPLANT
SUT VIC AB 3-0 SH 18 (SUTURE) IMPLANT
SUT VIC AB 3-0 SH 27 (SUTURE) ×2
SUT VIC AB 3-0 SH 27X BRD (SUTURE) ×4 IMPLANT
SUT VIC AB 3-0 SH 8-18 (SUTURE) ×3 IMPLANT
SUT VICRYL AB 2 0 TIES (SUTURE) IMPLANT
TAPE UMBILICAL 1/8 X36 TWILL (MISCELLANEOUS) IMPLANT
TOWEL GREEN STERILE (TOWEL DISPOSABLE) ×3 IMPLANT
TOWEL GREEN STERILE FF (TOWEL DISPOSABLE) ×3 IMPLANT
TRAY FOLEY MTR SLVR 14FR STAT (SET/KITS/TRAYS/PACK) ×3 IMPLANT
TRAY LAPAROSCOPIC MC (CUSTOM PROCEDURE TRAY) ×3 IMPLANT
TROCAR XCEL BLUNT TIP 100MML (ENDOMECHANICALS) ×3 IMPLANT
TROCAR XCEL NON-BLD 5MMX100MML (ENDOMECHANICALS) ×3 IMPLANT
TUBE FEEDING 8FR 16IN STR KANG (MISCELLANEOUS) IMPLANT
TUBE FEEDING ENTERAL 5FR 16IN (TUBING) IMPLANT
WARMER LAPAROSCOPE (MISCELLANEOUS) ×3 IMPLANT

## 2021-02-20 NOTE — Transfer of Care (Signed)
Immediate Anesthesia Transfer of Care Note  Patient: Alexander [name]  Procedure(s) Performed: STAGING LAPAROSCOPY (Abdomen) LIVER BIOPSY (Abdomen) EXPLORATORY LAPAROTOMY (Abdomen)  Patient Location: PACU  Anesthesia Type:General  Level of Consciousness: awake and alert   Airway & Oxygen Therapy: Patient Spontanous Breathing and Patient connected to face mask oxygen  Post-op Assessment: Report given to RN and Post -op Vital signs reviewed and stable  Post vital signs: Reviewed and stable  Last Vitals:  Vitals Value Taken Time  BP 157/91 02/20/21 1016  Temp    Pulse 71 02/20/21 1019  Resp 15 02/20/21 1019  SpO2 97 % 02/20/21 1019  Vitals shown include unvalidated device data.  Last Pain:  Vitals:   02/20/21 0558  TempSrc:   PainSc: 0-No pain         Complications: No notable events documented.

## 2021-02-20 NOTE — Op Note (Signed)
Date: 02/20/21  Patient: Alexander [name] MRN: [id number]  Preoperative Diagnosis: Gastric adenocarcinoma Postoperative Diagnosis: Stage IV gastric adenocarcinoma with liver metastases  Procedure: Staging laparoscopy with liver biopsy and lysis of adhesions; exploratory laparotomy with liver biopsy  Surgeon: Michaelle Birks, MD Assistant: Stark Klein, MD  EBL: 50 mL  Anesthesia: General endotracheal  Specimens:  Liver nodules Left liver nodule Right liver nodule  Indications: [name] is a 65 year old male who presented in April 2022 with gastric outlet obstruction and was found to have an adenocarcinoma of the pyloric channel.  He underwent abdominal exploration in early May with attempted resection via a distal gastrectomy, however intraoperatively the first portion of the duodenum was involved with tumor, thus a negative margin cannot be achieved with a distal gastrectomy alone.  Because the patient had poor nutrition at that point, the decision was made to perform a gastrojejunal bypass with feeding jejunostomy tube.  The patient subsequently underwent 3 cycles of FLOT chemotherapy, but was not able to tolerate any further chemotherapy.  Restaging scans showed downsize of the primary tumor with no evidence of metastatic disease. The patient had a prolonged recovery time following chemotherapy due to a significant decline in performance status, but was able to have significant improvement in performance status and nutrition.  After an extensive discussion of treatment options including radiation to the primary tumor versus re-exploration with resection, which would likely require a Whipple to achieve negative margin, the patient agreed to proceed with surgical resection.  Findings: Multiple nodules on the surface of both lobes of the liver.  A total of five nodules were sent for frozen section, two of which were positive for adenocarcinoma.  The planned gastric resection was thus  aborted.  Procedure details: Informed consent was obtained in the preoperative area prior to the procedure. The patient was brought to the operating room and placed on the table in the supine position. General anesthesia was induced and appropriate lines and drains were placed for intraoperative monitoring. Perioperative antibiotics were administered per SCIP guidelines. The abdomen was prepped and draped in the usual sterile fashion. A pre-procedure timeout was taken verifying patient identity, surgical site and procedure to be performed.  A small incision was made in the right upper quadrant, the fascia was grasped and elevated, and a Veress needle was inserted.  Intraperitoneal placement was confirmed with the saline drop test, but on insufflation there was immediately high pressure.  The needle was advanced but the pressure remained high, thus further attempts to enter the abdomen in this location were aborted.  An infraumbilical skin incision was made for her to the previous laparotomy scar, subcutaneous tissue was divided and the fascia was grasped and elevated.  Fascia was sharply incised and the peritoneal cavity was entered.  A 12 mm Hassan trocar was placed.  The abdomen was inspected and there was no evidence of visceral or vascular injury.  There were omental adhesions to the abdominal wall the previous midline incision.  There were no nodules on the diaphragm or the peritoneal surface.  The liver was examined and there were multiple subcentimeter nodules on the surface of both lobes.  Two 5 mm ports were placed in the right upper quadrant under direct visualization.  A biopsy forceps was used to sample 3 nodules from the right liver.  The stasis was achieved at each biopsy site using cautery.  These were sent for frozen analysis and were all negative for malignant cells.  While waiting for the frozen  analysis to be completed, the omental adhesions to the midline were taken down laparoscopically  using scissors.  After the frozen specimens returned benign, the ports were removed and the abdomen was desufflated.  A midline laparotomy incision was made through the previous surgical scar.  The subcutaneous tissue was divided with cautery, and the fascia was opened along the linea alba.  Some remaining omental adhesions were taken down from the abdominal wall using cautery at the superior aspect of the incision.  A Bookwalter fixed retractor was placed.  The small bowel was adherent to the abdominal wall the previous J-tube site in the left upper quadrant and this was left intact.  Liver was again palpated and visually inspected, and a larger firm nodule was identified on the undersurface of the left lateral segment, which had not been visible laparoscopically.  This nodule was about a centimeter in diameter.  This was excised sharply and sent for frozen analysis.  Another larger nodule is identified on the dome of the right liver which had not been visible laparoscopically.  This was also excised sharply and sent for frozen analysis.  Both of the specimens confirmed metastatic adenocarcinoma.  Thus the remainder of the procedure was aborted.  Hemostasis was obtained at all the liver biopsy sites using argon.  The abdomen was irrigated with warm saline and appeared hemostatic.  The retractors were removed.  The fascia was closed at midline using a running looped 1 PDS suture.  Scarpa's fascia was closed with a running 3-0 Vicryl suture.  The skin at the midline incision and the port sites was closed with a running subcuticular 4-0 Monocryl suture.  Dermabond was applied.  The patient tolerated the procedure with no apparent complications.  All counts were correct x2 at the end of the procedure. The patient was extubated and taken to PACU in stable condition.  Michaelle Birks, MD 02/20/21 10:23 AM

## 2021-02-20 NOTE — Anesthesia Procedure Notes (Signed)
Procedure Name: Intubation Date/Time: 02/20/2021 7:46 AM Performed by: Charyl Dancer, RN Pre-anesthesia Checklist: Patient identified, Emergency Drugs available, Suction available and Patient being monitored Patient Re-evaluated:Patient Re-evaluated prior to induction Oxygen Delivery Method: Circle System Utilized Preoxygenation: Pre-oxygenation with 100% oxygen Induction Type: IV induction Ventilation: Mask ventilation without difficulty and Oral airway inserted - appropriate to patient size Laryngoscope Size: Mac and 3 Grade View: Grade I Tube type: Oral Tube size: 7.5 mm Number of attempts: 1 Airway Equipment and Method: Stylet and Oral airway Placement Confirmation: ETT inserted through vocal cords under direct vision, positive ETCO2 and breath sounds checked- equal and bilateral Secured at: 22 cm Tube secured with: Tape Dental Injury: Teeth and Oropharynx as per pre-operative assessment

## 2021-02-20 NOTE — Progress Notes (Signed)
Dr. Lanetta Inch aware of blood pressure.  No order received.

## 2021-02-20 NOTE — Interval H&P Note (Signed)
History and Physical Interval Note:  02/20/2021 6:57 AM  Alexander [name]  has presented today for surgery, with the diagnosis of gastric cancer.  The various methods of treatment have been discussed with the patient and family. After consideration of risks, benefits and other options for treatment, the patient has consented to  Procedure(s): WHIPPLE PROCEDURE (N/A) STAGING LAPAROSCOPY (N/A) as a surgical intervention.  The patient's history has been reviewed, patient examined, no change in status, stable for surgery.  I have reviewed the patient's chart and labs.  Questions were answered to the patient's satisfaction.  Proceed to surgery, admit to inpatient postoperatively.   Dwan Bolt

## 2021-02-20 NOTE — Anesthesia Procedure Notes (Signed)
Arterial Line Insertion Start/End10/10/2020 7:45 AM, 02/20/2021 7:50 AM Performed by: Reece Agar, CRNA, CRNA  Patient location: OR. Preanesthetic checklist: patient identified, IV checked, site marked, risks and benefits discussed, surgical consent, monitors and equipment checked, pre-op evaluation, timeout performed and anesthesia consent Right, radial was placed Catheter size: 20 G Hand hygiene performed , maximum sterile barriers used  and Seldinger technique used Allen's test indicative of satisfactory collateral circulation Attempts: 1 Procedure performed without using ultrasound guided technique. Following insertion, dressing applied and Biopatch. Post procedure assessment: normal and unchanged  Patient tolerated the procedure well with no immediate complications.

## 2021-02-21 ENCOUNTER — Encounter (HOSPITAL_COMMUNITY): Payer: Self-pay | Admitting: Surgery

## 2021-02-21 LAB — CBC
HCT: 27.4 % — ABNORMAL LOW (ref 39.0–52.0)
Hemoglobin: 9.2 g/dL — ABNORMAL LOW (ref 13.0–17.0)
MCH: 33.7 pg (ref 26.0–34.0)
MCHC: 33.6 g/dL (ref 30.0–36.0)
MCV: 100.4 fL — ABNORMAL HIGH (ref 80.0–100.0)
Platelets: 275 10*3/uL (ref 150–400)
RBC: 2.73 MIL/uL — ABNORMAL LOW (ref 4.22–5.81)
RDW: 17 % — ABNORMAL HIGH (ref 11.5–15.5)
WBC: 15 10*3/uL — ABNORMAL HIGH (ref 4.0–10.5)
nRBC: 0 % (ref 0.0–0.2)

## 2021-02-21 LAB — BASIC METABOLIC PANEL
Anion gap: 7 (ref 5–15)
BUN: 7 mg/dL — ABNORMAL LOW (ref 8–23)
CO2: 26 mmol/L (ref 22–32)
Calcium: 9.1 mg/dL (ref 8.9–10.3)
Chloride: 103 mmol/L (ref 98–111)
Creatinine, Ser: 0.75 mg/dL (ref 0.61–1.24)
GFR, Estimated: >60 mL/min (ref >60)
Glucose, Bld: 126 mg/dL — ABNORMAL HIGH (ref 70–99)
Potassium: 4.9 mmol/L (ref 3.5–5.1)
Sodium: 136 mmol/L (ref 135–145)

## 2021-02-21 LAB — SURGICAL PATHOLOGY

## 2021-02-21 MED ORDER — HYDROMORPHONE HCL 1 MG/ML IJ SOLN: 0.5000 mg | INTRAMUSCULAR | Status: DC | PRN

## 2021-02-21 MED ORDER — POLYETHYLENE GLYCOL 3350 17 G PO PACK: 17.0000 g | PACK | Freq: Every day | ORAL | Status: DC | PRN

## 2021-02-21 MED ORDER — BISACODYL 5 MG PO TBEC
5.0000 mg | DELAYED_RELEASE_TABLET | Freq: Every day | ORAL | Status: DC
  Administered 2021-02-21 – 2021-02-22 (×2): 5 mg via ORAL
  Filled 2021-02-21 (×2): qty 1

## 2021-02-21 MED ORDER — OXYCODONE HCL 5 MG PO TABS: 5.0000 mg | ORAL_TABLET | ORAL | 0 refills | Status: DC | PRN

## 2021-02-21 MED ORDER — ACETAMINOPHEN 500 MG PO TABS: 1000.0000 mg | ORAL_TABLET | Freq: Three times a day (TID) | ORAL | 0 refills | Status: AC | PRN

## 2021-02-21 NOTE — Discharge Instructions (Addendum)
[facility] DISCHARGE INSTRUCTIONS  Activity No heavy lifting greater than 15 pounds for 6 weeks after surgery. Ok to shower, but do not bathe or submerge incisions underwater. Do not drive while taking narcotic pain medication.  Wound Care Your incision is covered with skin glue called Dermabond. This will peel off on its own over time. You may shower and allow warm soapy water to run over your incisions. Gently pat dry. Do not submerge your incision underwater. Monitor your incision for any new redness, tenderness, or drainage.  When to Call us: Fever greater than 100.5 New redness, drainage, or swelling at incision site Severe pain, nausea, or vomiting Jaundice (yellowing of the whites of the eyes or skin)  Follow-up You have an appointment scheduled with Dr. Zenia Resides on March 11, 2021 at 9:30am. This [facility] be at the [facility] Surgery office at [address], [place], [place], [place]. Please arrive at least 15 minutes prior to your scheduled appointment time.  For questions or concerns, please call the office at [phone].

## 2021-02-21 NOTE — Progress Notes (Signed)
    1 Day Post-Op  Subjective: No acute complaints this morning. Pain well-controlled. One episode of nausea with movement but no vomiting. Tolerated clear liquids yesterday.   Objective: Vital signs in last 24 hours: Temp:  [97.4 F (36.3 C)-98.4 F (36.9 C)] 98.4 F (36.9 C) (10/07 0416) Pulse Rate:  [64-79] 64 (10/07 0416) Resp:  [8-25] 20 (10/06 1720) BP: (125-183)/(80-104) 142/85 (10/07 0416) SpO2:  [84 %-100 %] 97 % (10/07 0416) Arterial Line BP: (178-191)/(87-94) 187/93 (10/06 1031)    Intake/Output from previous day: 10/06 0701 - 10/07 0700 In: 1749.5 [P.O.:60; I.V.:1489.5; IV Piggyback:200] Out: 2170 [Urine:2120; Blood:50] Intake/Output this shift: Total I/O In: 60 [P.O.:60] Out: 1050 [Urine:1050]  PE: General: resting comfortably, NAD Neuro: alert and oriented, no focal deficits Resp: normal work of breathing on room air Abdomen: soft, nondistended, appropriately tender to palpation at incision. Midline incision is clean and dry with mild surrounding ecchymosis but no induration or drainage. Lap incisions clean and dry. Extremities: warm and well-perfused    Lab Results:  Recent Labs    02/18/21 1520 02/21/21 0134  WBC 9.1 15.0*  HGB 10.7* 9.2*  HCT 32.5* 27.4*  PLT 318 275   BMET Recent Labs    02/18/21 1520 02/21/21 0134  NA 136 136  K 3.6 4.9  CL 104 103  CO2 23 26  GLUCOSE 146* 126*  BUN 12 7*  CREATININE 0.70 0.75  CALCIUM 8.7* 9.1   PT/INR No results for input(s): LABPROT, INR in the last 72 hours. CMP     Component Value Date/Time   NA 136 02/21/2021 0134   NA 134 (A) 01/31/2021 0000   K 4.9 02/21/2021 0134   CL 103 02/21/2021 0134   CO2 26 02/21/2021 0134   GLUCOSE 126 (H) 02/21/2021 0134   BUN 7 (L) 02/21/2021 0134   BUN 14 01/31/2021 0000   CREATININE 0.75 02/21/2021 0134   CALCIUM 9.1 02/21/2021 0134   PROT 5.8 (L) 09/26/2020 0418   ALBUMIN 3.4 (A) 01/31/2021 0000   AST 28 01/31/2021 0000   ALT 19 01/31/2021 0000    ALKPHOS 82 01/31/2021 0000   BILITOT 0.7 09/26/2020 0418   GFRNONAA >60 02/21/2021 0134   Lipase  No results found for: LIPASE     Studies/Results: No results found.      Assessment/Plan 65 yo male with stage IV gastric adenocarcinoma, POD1 s/p diagnostic laparoscopy, exploratory laparotomy with liver biopsy. - Intraop findings discussed with patient and his wife at bedside last night. Will ensure patient has follow up with Dr. Bobby Rumpf to discuss further chemotherapy options. - Advance to regular diet this morning, SLIV when tolerating PO - Remove foley catheter - Multimodal pain control - Ambulate, PT ordered - VTE: Lovenox, SCDs - Dispo: inpatient, med-surg floor    LOS: 1 day    Michaelle Birks, MD [facility] Surgery General, Hepatobiliary and Pancreatic Surgery 02/21/21 6:34 AM

## 2021-02-21 NOTE — Progress Notes (Signed)
   02/21/21 1019  PT Recommendation  Follow Up Recommendations No PT follow up  PT equipment None recommended by PT  Evaluation completed. Full note to follow.   Arsenia Goracke M,PT Acute Rehab Services [phone] (pager)

## 2021-02-21 NOTE — Evaluation (Signed)
Physical Therapy Evaluation Patient Details Name: Alexander [name] MRN: [id number] DOB: 01-16-1956 Today's Date: 02/21/2021  History of Present Illness  65 yo male admitted 10/6 with stage IV gastric adenocarcinoma, POD1 s/p diagnostic laparoscopy, exploratory laparotomy with liver biopsy. PMH: COPD, GERD  Clinical Impression  Pt admitted with above diagnosis. Pt was able to ambulate with and without RW (feels more steady with RW and had one at home).  Pt should progress well.  Pt currently with functional limitations due to the deficits listed below (see PT Problem List). Pt will benefit from skilled PT to increase their independence and safety with mobility to allow discharge to the venue listed below.          Recommendations for follow up therapy are one component of a multi-disciplinary discharge planning process, led by the attending physician.  Recommendations may be updated based on patient status, additional functional criteria and insurance authorization.  Follow Up Recommendations No PT follow up    Equipment Recommendations  None recommended by PT    Recommendations for Other Services       Precautions / Restrictions Precautions Precautions: Fall Restrictions Weight Bearing Restrictions: No      Mobility  Bed Mobility Overal bed mobility: Needs Assistance Bed Mobility: Rolling;Sidelying to Sit Rolling: Min guard Sidelying to sit: Min guard       General bed mobility comments: Taught pt to use pillow on abdomen for comfort to sit up to EOB. Cues for technqiues given    Transfers Overall transfer level: Needs assistance Equipment used: Rolling walker (2 wheeled) Transfers: Sit to/from Stand Sit to Stand: Min guard         General transfer comment: cues for hand placement  Ambulation/Gait Ambulation/Gait assistance: Min guard Gait Distance (Feet): 500 Feet Assistive device: Rolling walker (2 wheeled);None Gait Pattern/deviations: Step-through  pattern;Decreased stride length   Gait velocity interpretation: 1.31 - 2.62 ft/sec, indicative of limited community ambulator General Gait Details: Pt began ambulation using RW however by end of treatment walked without RW. Pt felt more comfortable with the RW per pt but was able to walk without the rW with good stabiltiy. States he uses his rollator when needed at home.  Stairs            Wheelchair Mobility    Modified Rankin (Stroke Patients Only)       Balance Overall balance assessment: Needs assistance Sitting-balance support: No upper extremity supported;Feet supported Sitting balance-Leahy Scale: Fair     Standing balance support: Bilateral upper extremity supported;No upper extremity supported;During functional activity Standing balance-Leahy Scale: Fair Standing balance comment: pt can stand staticallyw ithout UE support                             Pertinent Vitals/Pain Pain Assessment: Faces Faces Pain Scale: Hurts whole lot Pain Location: incision Pain Descriptors / Indicators: Aching;Grimacing;Guarding Pain Intervention(s): Limited activity within patient's tolerance;Monitored during session;Repositioned    Home Living Family/patient expects to be discharged to:: Private residence Living Arrangements: Spouse/significant other Available Help at Discharge: Family;Available 24 hours/day Type of Home: House Home Access: Stairs to enter Entrance Stairs-Rails: None Entrance Stairs-Number of Steps: 2 Home Layout: One level Home Equipment: Walker - 4 wheels;Other (comment);Shower seat;Bedside commode;Cane - single point (lift chair)      Prior Function Level of Independence: Independent               Hand Dominance   Dominant Hand:  Right    Extremity/Trunk Assessment   Upper Extremity Assessment Upper Extremity Assessment: Defer to OT evaluation    Lower Extremity Assessment Lower Extremity Assessment: Generalized weakness     Cervical / Trunk Assessment Cervical / Trunk Assessment: Normal  Communication   Communication: No difficulties  Cognition Arousal/Alertness: Awake/alert Behavior During Therapy: WFL for tasks assessed/performed Overall Cognitive Status: Within Functional Limits for tasks assessed                                        General Comments      Exercises General Exercises - Lower Extremity Ankle Circles/Pumps: AROM;Both;10 reps;Seated Long Arc Quad: AROM;Both;10 reps;Seated Hip Flexion/Marching: AROM;Both;10 reps;Seated   Assessment/Plan    PT Assessment Patient needs continued PT services  PT Problem List Decreased activity tolerance;Decreased balance;Decreased mobility;Decreased knowledge of use of DME;Decreased safety awareness;Pain       PT Treatment Interventions DME instruction;Gait training;Functional mobility training;Therapeutic activities;Therapeutic exercise;Balance training;Stair training;Patient/family education    PT Goals (Current goals can be found in the Care Plan section)  Acute Rehab PT Goals Patient Stated Goal: to go home PT Goal Formulation: With patient Time For Goal Achievement: 03/07/21 Potential to Achieve Goals: Good    Frequency Min 3X/week   Barriers to discharge        Co-evaluation               AM-PAC PT "6 Clicks" Mobility  Outcome Measure Help needed turning from your back to your side while in a flat bed without using bedrails?: A Little Help needed moving from lying on your back to sitting on the side of a flat bed without using bedrails?: A Little Help needed moving to and from a bed to a chair (including a wheelchair)?: A Little Help needed standing up from a chair using your arms (e.g., wheelchair or bedside chair)?: A Little Help needed to walk in hospital room?: A Little Help needed climbing 3-5 steps with a railing? : A Little 6 Click Score: 18    End of Session Equipment Utilized During Treatment:  Gait belt Activity Tolerance: Patient tolerated treatment well Patient left: in chair;with call bell/phone within reach;with chair alarm set Nurse Communication: Mobility status PT Visit Diagnosis: Muscle weakness (generalized) (M62.81);Pain Pain - part of body:  (abdomen)    Time: 1002-1020 PT Time Calculation (min) (ACUTE ONLY): 18 min   Charges:   PT Evaluation $PT Eval Moderate Complexity: 1 Mod          Jaysean Manville M,PT Acute Rehab Services [phone] (pager)   Alvira Philips 02/21/2021, 1:03 PM

## 2021-02-22 NOTE — Progress Notes (Signed)
Discharge instructions given to pt and wife. Both verbalized understanding of all teaching. Abdominal binder given to pt at discharge. Pt discharge to home with wife via wheelchair with all belongings.

## 2021-02-22 NOTE — Discharge Summary (Signed)
Physician Discharge Summary   Patient ID: Alexander [name] MRN: [id number] DOB/AGE: 02/02/1956 65 y.o.  Admit date: 02/20/2021  Discharge date: 02/22/2021  Discharge Diagnoses:  Active Problems:   Gastric adenocarcinoma ([place])   Gastric cancer [facility])   Discharged Condition: good  Hospital Course: Patient was admitted for observation following gastric surgery.  Post op course was uncomplicated.  Pain was well controlled.  Tolerated diet.  Patient was prepared for discharge home on POD#2.   Consults: None  Treatments: surgery: ex lap with liver biopsy  Discharge Exam: Blood pressure (!) 149/97, pulse 78, temperature 98.5 F (36.9 C), temperature source Oral, resp. rate 17, height 5\' 3"  (1.6 m), weight 61.2 kg, SpO2 95 %. HEENT - clear Neck - soft Chest - clear bilaterally Cor - RRR Abd - soft without distension; wounds dry and intact with Dermabond in place; passing flatus  Disposition: Home  Discharge Instructions     Diet - low sodium heart healthy   Complete by: As directed    Increase activity slowly   Complete by: As directed    No dressing needed   Complete by: As directed    May shower.      Allergies as of 02/22/2021   No Known Allergies      Medication List     TAKE these medications    acetaminophen 500 MG tablet Commonly known as: TYLENOL Take 2 tablets (1,000 mg total) by mouth every 8 (eight) hours as needed for mild pain.   cloNIDine 0.3 mg/24hr patch Commonly known as: CATAPRES - Dosed in mg/24 hr Place 1 patch (0.3 mg total) onto the skin every Friday.   dexamethasone 4 MG tablet Commonly known as: DECADRON TAKE 2 TABLETS BY MOUTH THE MORNING BEFORE AND 2 TABS THE MORNING OF CHEMOTHERAPY   feeding supplement Liqd Take 237 mLs by mouth 2 (two) times daily between meals.   ondansetron 4 MG disintegrating tablet Commonly known as: Zofran ODT Take 1 tablet (4 mg total) by mouth every 8 (eight) hours as needed for nausea or  vomiting.   ondansetron 4 MG tablet Commonly known as: ZOFRAN Take 1 tablet (4 mg total) by mouth every 4 (four) hours as needed for nausea.   oxyCODONE 5 MG immediate release tablet Commonly known as: Oxy IR/ROXICODONE Take 1-2 tablets (5-10 mg total) by mouth every 4 (four) hours as needed (5mg  for moderate pain, 10mg  for severe pain).   pantoprazole 40 MG tablet Commonly known as: PROTONIX Take 40 mg by mouth in the morning and at bedtime.   prochlorperazine 10 MG tablet Commonly known as: COMPAZINE Take 1 tablet (10 mg total) by mouth every 6 (six) hours as needed for nausea or vomiting.   rosuvastatin 10 MG tablet Commonly known as: CRESTOR Take 10 mg by mouth every morning.   traZODone 100 MG tablet Commonly known as: DESYREL Take 100 mg by mouth at bedtime.               Discharge Care Instructions  (From admission, onward)           Start     Ordered   02/22/21 0000  No dressing needed       Comments: May shower.   02/22/21 7209            Follow-up Information     Dwan Bolt, MD. Schedule an appointment as soon as possible for a visit in 2 week(s).   Specialty: General Surgery Why:  For wound re-check Contact information: [address] [place] [address] [phone]                 Wash Nienhaus, [place] Office: [phone]   Signed: Armandina Gemma 02/22/2021, 9:26 AM

## 2021-02-24 MED ORDER — BUPIVACAINE LIPOSOME 1.3 % IJ SUSP
INTRAMUSCULAR | Status: DC | PRN
Start: 2021-02-20 — End: 2021-02-24
  Administered 2021-02-20 (×2): 10 mL via PERINEURAL

## 2021-02-24 MED ORDER — BUPIVACAINE HCL (PF) 0.5 % IJ SOLN
INTRAMUSCULAR | Status: DC | PRN
  Administered 2021-02-20 (×2): 15 mL via PERINEURAL

## 2021-02-24 NOTE — Anesthesia Postprocedure Evaluation (Signed)
Anesthesia Post Note  Patient: Alexander [name]  Procedure(s) Performed: STAGING LAPAROSCOPY (Abdomen) LIVER BIOPSY (Abdomen) EXPLORATORY LAPAROTOMY (Abdomen)     Patient location during evaluation: PACU Anesthesia Type: Regional and General Level of consciousness: awake and alert Pain management: pain level controlled Vital Signs Assessment: post-procedure vital signs reviewed and stable Respiratory status: spontaneous breathing, nonlabored ventilation, respiratory function stable and patient connected to nasal cannula oxygen Cardiovascular status: blood pressure returned to baseline and stable Postop Assessment: no apparent nausea or vomiting Anesthetic complications: no   No notable events documented.  Last Vitals:  Vitals:   02/22/21 0415 02/22/21 0859  BP: (!) 174/99 (!) 149/97  Pulse: 75 78  Resp: 17 17  Temp: 36.7 C 36.9 C  SpO2: 91% 95%    Last Pain:  Vitals:   02/22/21 0859  TempSrc: Oral  PainSc:                  Laryah Neuser L Teresina Bugaj

## 2021-02-24 NOTE — Anesthesia Procedure Notes (Signed)
Anesthesia Regional Block: Quadratus lumborum   Pre-Anesthetic Checklist: , timeout performed,  Correct Patient, Correct Site, Correct Laterality,  Correct Procedure, Correct Position, site marked,  Risks and benefits discussed,  Surgical consent,  Pre-op evaluation,  At surgeon's request and post-op pain management  Laterality: Left  Prep: Maximum Sterile Barrier Precautions used, chloraprep       Needles:  Injection technique: Single-shot  Needle Type: Echogenic Stimulator Needle     Needle Length: 9cm  Needle Gauge: 22     Additional Needles:   Procedures:,,,, ultrasound used (permanent image in chart),,    Narrative:  Start time: 02/20/2021 7:10 AM End time: 02/20/2021 7:17 AM Injection made incrementally with aspirations every 5 mL.  Performed by: Personally  Anesthesiologist: Freddrick March, MD  Additional Notes: Monitors applied. No increased pain on injection. No increased resistance to injection. Injection made in 5cc increments. Good needle visualization. Patient tolerated procedure well.

## 2021-02-24 NOTE — Anesthesia Procedure Notes (Signed)
Anesthesia Regional Block: Quadratus lumborum   Pre-Anesthetic Checklist: , timeout performed,  Correct Patient, Correct Site, Correct Laterality,  Correct Procedure, Correct Position, site marked,  Risks and benefits discussed,  Surgical consent,  Pre-op evaluation,  At surgeon's request and post-op pain management  Laterality: Right  Prep: Maximum Sterile Barrier Precautions used, chloraprep       Needles:  Injection technique: Single-shot  Needle Type: Echogenic Stimulator Needle     Needle Length: 9cm  Needle Gauge: 22     Additional Needles:   Procedures:,,,, ultrasound used (permanent image in chart),,    Narrative:  Start time: 02/20/2021 7:17 AM End time: 02/20/2021 7:22 AM Injection made incrementally with aspirations every 5 mL.  Performed by: Personally  Anesthesiologist: Freddrick March, MD  Additional Notes: Monitors applied. No increased pain on injection. No increased resistance to injection. Injection made in 5cc increments. Good needle visualization. Patient tolerated procedure well.

## 2021-02-25 LAB — BPAM RBC
Blood Product Expiration Date: 202210302359
Blood Product Expiration Date: 202211022359
ISSUE DATE / TIME: 202209300812
Unit Type and Rh: 5100
Unit Type and Rh: 5100

## 2021-02-25 LAB — TYPE AND SCREEN
ABO/RH(D): O POS
Antibody Screen: NEGATIVE
Unit division: 0
Unit division: 0

## 2021-03-13 NOTE — Progress Notes (Signed)
[facility]  [address] [place],  [place]  [address] [phone]  Clinic Day:  03/19/2021  Referring physician: Burney Gauze, MD  This document serves as a record of services personally performed by Lourene Hoston Macarthur Critchley, MD. It was created on their behalf by [facility] E, a trained medical scribe. The creation of this record is based on the scribe's personal observations and the provider's statements to them.  HISTORY OF PRESENT ILLNESS:  The patient is a 65 y.o. male who now has metastatic gastric cancer.  While undergoing potential surgery to remove his cancer, inspection showed lesions on his liver surface, which were biopsy proven to be metastatic gastric cancer.  Based upon these findings, his surgery was cancelled.  The patient now comes in today to determine what to do next.  Overall, the patient feels great.  His daily quality of life remains very good.  He only takes oxycodone sparingly in the morning for occasional abdominal discomfort.    This gentleman was initially diagnosed with stage IVA (T4b N0 M0) gastric adenocarcinoma, with his disease abutting his pancreas.  He received 3 of 4 planned cycles of neoadjuvant FLOT chemotherapy.  The significant decline in his health prevented him from receiving his last cycle of FLOT.    PHYSICAL EXAM:  Blood pressure (!) 147/66, pulse 70, temperature 98.7 F (37.1 C), resp. rate 16, height 5\' 3"  (1.6 m), weight 130 lb 12.8 oz (59.3 kg), SpO2 97 %. Wt Readings from Last 3 Encounters:  03/19/21 130 lb 12.8 oz (59.3 kg)  02/20/21 135 lb (61.2 kg)  02/18/21 136 lb 6.4 oz (61.9 kg)   Body mass index is 23.17 kg/m. Performance status (ECOG): 3 Physical Exam Constitutional:      Appearance: He is not ill-appearing.     Comments: The patient looks stronger vs previous visits   HENT:     Mouth/Throat:     Mouth: Mucous membranes are moist.     Pharynx: Oropharynx is clear. No oropharyngeal exudate or  posterior oropharyngeal erythema.  Cardiovascular:     Rate and Rhythm: Normal rate and regular rhythm.     Heart sounds: No murmur heard.   No friction rub. No gallop.  Pulmonary:     Effort: Pulmonary effort is normal. No respiratory distress.     Breath sounds: Normal breath sounds. No wheezing, rhonchi or rales.  Abdominal:     General: Bowel sounds are normal. There is no distension.     Palpations: Abdomen is soft. There is no mass.     Tenderness: There is no abdominal tenderness.     Comments: No palpable abdominal masses.    Musculoskeletal:        General: No swelling.     Right lower leg: No edema.     Left lower leg: No edema.  Lymphadenopathy:     Cervical: No cervical adenopathy.     Upper Body:     Right upper body: No supraclavicular or axillary adenopathy.     Left upper body: No supraclavicular or axillary adenopathy.     Lower Body: No right inguinal adenopathy. No left inguinal adenopathy.  Skin:    General: Skin is warm.     Coloration: Skin is not jaundiced.     Findings: No lesion or rash.  Neurological:     General: No focal deficit present.     Mental Status: He is alert and oriented to person, place, and time. Mental status is at  baseline.  Psychiatric:        Mood and Affect: Mood normal.        Behavior: Behavior normal.        Thought Content: Thought content normal.   PATHOLOGY:  Recent biopsies from his planned surgery at [facility] revealed the following: Surgical Pathology: A. LIVER, NODULES, BIOPSY:  - Benign mesothelial inclusion cyst  - Negative for carcinoma   B. LIVER, LEFT #2, BIOPSY:  - Metastatic adenocarcinoma to liver, consistent with patient's clinical  history of primary gastric adenocarcinoma   C. LIVER, RIGHT #3, BIOPSY:  - Metastatic adenocarcinoma to liver, consistent with patient's clinical  history of primary gastric adenocarcinoma   ASSESSMENT & PLAN:  A 65 y.o. male with metastatic gastric adenocarcinoma.  It is very  concerning that his disease has returned despite receiving the 4-drug  FLOT regimen.  I will have his biopsied metastatic liver disease sent to [facility] to undergo testing to determine if some form of targeted therapy could be used to treat his disease.  I will also have him undergo a PET scan to determine if he has other areas of disease metastasis.  I will see him back in 4 weeks to go over his PET scan images and his [facility] results, which will be used to formulate his next course of action. The patient and his wife understand all the plans discussed today and are in agreement with them.    I, Rita Ohara, am acting as scribe for [name], MD    I have reviewed this report as typed by the medical scribe, and it is complete and accurate.  Maddix Kliewer Macarthur Critchley, MD

## 2021-03-19 ENCOUNTER — Other Ambulatory Visit: Payer: Self-pay | Admitting: Oncology

## 2021-03-19 ENCOUNTER — Telehealth: Payer: Self-pay | Admitting: Oncology

## 2021-03-19 ENCOUNTER — Inpatient Hospital Stay: Payer: PPO | Attending: Oncology | Admitting: Oncology

## 2021-03-19 VITALS — BP 147/66 | HR 70 | Temp 98.7°F | Resp 16 | Ht 63.0 in | Wt 130.8 lb

## 2021-03-19 DIAGNOSIS — C169 Malignant neoplasm of stomach, unspecified: Secondary | ICD-10-CM | POA: Diagnosis not present

## 2021-03-19 NOTE — Telephone Encounter (Signed)
Per 11/2 los next appt scheduled and given to patient 

## 2021-03-27 ENCOUNTER — Other Ambulatory Visit: Payer: Self-pay | Admitting: Hematology and Oncology

## 2021-03-27 ENCOUNTER — Telehealth: Payer: Self-pay

## 2021-03-27 MED ORDER — OXYCODONE HCL 5 MG PO TABS: 5.0000 mg | ORAL_TABLET | ORAL | 0 refills | Status: DC | PRN

## 2021-03-27 NOTE — Telephone Encounter (Signed)
RE: Pain med refill Received: Today Melodye Ped, NP  Dairl Ponder, RN Prescription sent    Pt's wife called to ask if Dr Bobby Rumpf would send in oxycodone for pain. She states that pt's surgeon gave him the last prescription. She is hoping since Dr Bobby Rumpf has seen him last, he will fill the pain med.

## 2021-04-01 ENCOUNTER — Encounter (HOSPITAL_COMMUNITY): Payer: Self-pay | Admitting: Oncology

## 2021-04-01 ENCOUNTER — Encounter (HOSPITAL_COMMUNITY): Payer: Self-pay

## 2021-04-02 ENCOUNTER — Other Ambulatory Visit: Payer: Self-pay | Admitting: Hematology and Oncology

## 2021-04-02 DIAGNOSIS — C169 Malignant neoplasm of stomach, unspecified: Secondary | ICD-10-CM

## 2021-04-03 ENCOUNTER — Other Ambulatory Visit: Payer: PPO

## 2021-04-03 ENCOUNTER — Ambulatory Visit: Payer: PPO | Admitting: Oncology

## 2021-04-04 NOTE — Progress Notes (Signed)
[place]  [address] [place],  [place]  [address] [phone]  Clinic Day:  04/15/2021  Referring physician: Burney Gauze, MD  This document serves as a record of services personally performed by Iyonnah Ferrante Macarthur Critchley, MD. It was created on their behalf by [facility] E, a trained medical scribe. The creation of this record is based on the scribe's personal observations and the provider's statements to them.  HISTORY OF PRESENT ILLNESS:  The patient is a 65 y.o. male who now has metastatic gastric cancer.  While undergoing potential surgery to remove his cancer, inspection showed lesions on his liver surface, which were biopsy proven to be metastatic gastric cancer.  Based upon these findings, his surgery was cancelled.  The patient now comes in today to go over his PET scan images and their implications.  Since his last visit, the patient has been doing fairly well.  However, he has begun to have more abdominal pain to where he has become more reliant upon his oxycodone pain medication.    This gentleman was initially diagnosed with stage IVA (T4b N0 M0) gastric adenocarcinoma, with his disease abutting his pancreas.  He received 3 of 4 planned cycles of neoadjuvant FLOT chemotherapy.  The significant decline in his health prevented him from receiving his last cycle of FLOT.    PHYSICAL EXAM:  Blood pressure 140/89, pulse 75, temperature 98.3 F (36.8 C), resp. rate 16, height 5\' 3"  (1.6 m), weight 125 lb 9.6 oz (57 kg), SpO2 93 %. Wt Readings from Last 3 Encounters:  04/15/21 125 lb 9.6 oz (57 kg)  03/19/21 130 lb 12.8 oz (59.3 kg)  02/20/21 135 lb (61.2 kg)   Body mass index is 22.25 kg/m. Performance status (ECOG): 3 Physical Exam Constitutional:      Appearance: He is not ill-appearing.  HENT:     Mouth/Throat:     Mouth: Mucous membranes are moist.     Pharynx: Oropharynx is clear. No oropharyngeal exudate or posterior oropharyngeal  erythema.  Cardiovascular:     Rate and Rhythm: Normal rate and regular rhythm.     Heart sounds: No murmur heard.   No friction rub. No gallop.  Pulmonary:     Effort: Pulmonary effort is normal. No respiratory distress.     Breath sounds: Normal breath sounds. No wheezing, rhonchi or rales.  Abdominal:     General: Bowel sounds are normal. There is no distension.     Palpations: Abdomen is soft. There is no mass.     Tenderness: There is no abdominal tenderness.     Comments: No palpable abdominal masses.    Musculoskeletal:        General: No swelling.     Right lower leg: No edema.     Left lower leg: No edema.  Lymphadenopathy:     Cervical: No cervical adenopathy.     Upper Body:     Right upper body: No supraclavicular or axillary adenopathy.     Left upper body: No supraclavicular or axillary adenopathy.     Lower Body: No right inguinal adenopathy. No left inguinal adenopathy.  Skin:    General: Skin is warm.     Coloration: Skin is not jaundiced.     Findings: No lesion or rash.  Neurological:     General: No focal deficit present.     Mental Status: He is alert and oriented to person, place, and time. Mental status is at baseline.  Psychiatric:  Mood and Affect: Mood normal.        Behavior: Behavior normal.        Thought Content: Thought content normal.   SCANS: Recent PET imaging has revealed the following: FINDINGS: Mediastinal blood pool activity: SUV max 2.1 Liver activity: SUV max NA  NECK: No hypermetabolic lymph nodes in the neck. Incidental CT findings: Port in the anterior chest wall with tip in distal SVC.  CHEST: No hypermetabolic mediastinal or hilar nodes. No suspicious pulmonary nodules on the CT scan. Incidental CT findings: None  ABDOMEN/PELVIS: Persistent intense metabolic activity in the region of the gastric pylorus with SUV max equal 6.7. (Image 111). The current region of metabolic activity is decreased in volume  from comparison PET-CT scan and activity with SUV max equal 12.8 on prior. In the dome of the liver medially there is activity between the medial portion of the liver and the heart with SUV max equal 4.3 (image 99). This is new from comparison exam. Several smaller peripheral foci metabolic activity noted in the dome liver slightly more laterally SUV max equal 3.6. No corresponding lesions on diagnostic CT 12/04/2020 More inferiorly in the RIGHT hepatic lobe there is a focus of intense metabolic activity which is clearly within the parenchyma of the liver measuring 2.6 cm with SUV max equal 5.6. This lesion is also not present on comparison diagnostic CT scan. No hypermetabolic upper abdominal lymph nodes no hypermetabolic periaortic or iliac lymph nodes.  SKELETON: No focal hypermetabolic activity to suggest skeletal metastasis. Incidental CT findings: none  IMPRESSION: 1. Persistent intense metabolic activity through the gastric pyloric region. While the region of activity is decreased in volume and intensity there is persistent significant activity concerning for residual carcinoma. 2. New hypermetabolic lesion in the inferior RIGHT hepatic lobe with corresponding CT findings consistent hepatic metastasis. 3. More subtly activity in the dome liver is concerning for hepatic metastasis given the RIGHT hepatic lobe findings. Consider MRI of the liver with contrast if determination of multifocal hepatic metastasis is clinically relevant.  ASSESSMENT & PLAN:  A 65 y.o. male with metastatic gastric adenocarcinoma.  In clinic today, I went over all of his PET scan images with him, as well as their implications.  The patient could clearly see that he has multiple metastatic lesions within his liver.  What concerns me is that his disease progressed despite being on a 4-drug regimen of FLOT chemotherapy.  Moving forward, he understands the main goal of his treatment is disease control.  Of  note, this gentleman's tissue was sent to [facility] for additional testing.  Unfortunately, testing did not show where his cancer would be sensitive to any form of targeted therapy.  Moving forward, his second-line treatment will consist of paclitaxel/ramucirumab.  Paclitaxel would be given weekly, every 3 out of 4 weeks; ramucirumab would be given every 2 weeks.  He was made aware of the side effects which go along with this regimen, including peripheral neuropathy, alopecia, cytopenias, and hypertension.  I also gave the patient the option of seeking a second opinion, particularly to determine if there may be a clinical trial for which he may be eligible for at a local institution.  Currently, the patient is uncertain as to what he wished to do.  He wants to take a few days to consider all of his options before coming to a decision.  I will tentatively see him back in 1 month for repeat clinical assessment.  However, he knows to call us  as soon as possible when he has made a decision about what he wishes to do next for his palliative disease management.  Both the patient and his wife understand all the plans discussed today and are in agreement with him.    I, Rita Ohara, am acting as scribe for [name], MD    I have reviewed this report as typed by the medical scribe, and it is complete and accurate.  Chantale Leugers Macarthur Critchley, MD

## 2021-04-14 DIAGNOSIS — C169 Malignant neoplasm of stomach, unspecified: Secondary | ICD-10-CM | POA: Diagnosis not present

## 2021-04-14 DIAGNOSIS — K7689 Other specified diseases of liver: Secondary | ICD-10-CM | POA: Diagnosis not present

## 2021-04-15 ENCOUNTER — Other Ambulatory Visit: Payer: Self-pay

## 2021-04-15 ENCOUNTER — Telehealth: Payer: Self-pay | Admitting: Oncology

## 2021-04-15 ENCOUNTER — Inpatient Hospital Stay (INDEPENDENT_AMBULATORY_CARE_PROVIDER_SITE_OTHER): Payer: PPO | Admitting: Oncology

## 2021-04-15 VITALS — BP 140/89 | HR 75 | Temp 98.3°F | Resp 16 | Ht 63.0 in | Wt 125.6 lb

## 2021-04-15 DIAGNOSIS — C169 Malignant neoplasm of stomach, unspecified: Secondary | ICD-10-CM | POA: Diagnosis not present

## 2021-04-15 NOTE — Telephone Encounter (Signed)
Per 11/29 LOS, patient scheduled for Dec Appt's.  Gave patient Appt Calendar

## 2021-04-16 ENCOUNTER — Telehealth: Payer: Self-pay

## 2021-04-16 ENCOUNTER — Other Ambulatory Visit: Payer: Self-pay | Admitting: Hematology and Oncology

## 2021-04-16 MED ORDER — NITROFURANTOIN MONOHYD MACRO 100 MG PO CAPS
100.0000 mg | ORAL_CAPSULE | Freq: Two times a day (BID) | ORAL | 0 refills | Status: DC
Start: 2021-04-16 — End: 2021-04-25

## 2021-04-16 NOTE — Telephone Encounter (Addendum)
RE: Req prescription for UTI Received: Today Melodye Ped, NP  Dairl Ponder, RN I sent in [place]. They just saw Lewis yesterday.          Pt's wife states, "We need another prescription for an UTI. His urine is amber in color and has a strong odor. I am retired [employer] so I know what UTI looks likre".  Afebrile. NKDA. Uses [facility].

## 2021-04-18 ENCOUNTER — Telehealth: Payer: Self-pay

## 2021-04-18 NOTE — Telephone Encounter (Signed)
Dr. Bobby Rumpf aware of patient decision.  Pt has appt on 05-15-21.

## 2021-04-22 ENCOUNTER — Other Ambulatory Visit: Payer: Self-pay

## 2021-04-22 DIAGNOSIS — T451X5A Adverse effect of antineoplastic and immunosuppressive drugs, initial encounter: Secondary | ICD-10-CM

## 2021-04-22 MED ORDER — ONDANSETRON HCL 4 MG PO TABS: 4.0000 mg | ORAL_TABLET | ORAL | 3 refills | Status: AC | PRN

## 2021-04-25 ENCOUNTER — Telehealth: Payer: Self-pay

## 2021-04-25 ENCOUNTER — Other Ambulatory Visit: Payer: Self-pay

## 2021-04-25 ENCOUNTER — Inpatient Hospital Stay: Payer: PPO | Attending: Oncology | Admitting: Hematology and Oncology

## 2021-04-25 ENCOUNTER — Encounter: Payer: Self-pay | Admitting: Hematology and Oncology

## 2021-04-25 ENCOUNTER — Telehealth: Payer: Self-pay | Admitting: Hematology and Oncology

## 2021-04-25 ENCOUNTER — Inpatient Hospital Stay: Payer: PPO

## 2021-04-25 ENCOUNTER — Other Ambulatory Visit: Payer: Self-pay | Admitting: Hematology and Oncology

## 2021-04-25 DIAGNOSIS — N390 Urinary tract infection, site not specified: Secondary | ICD-10-CM | POA: Diagnosis not present

## 2021-04-25 DIAGNOSIS — C169 Malignant neoplasm of stomach, unspecified: Secondary | ICD-10-CM

## 2021-04-25 DIAGNOSIS — R300 Dysuria: Secondary | ICD-10-CM

## 2021-04-25 DIAGNOSIS — G8929 Other chronic pain: Secondary | ICD-10-CM

## 2021-04-25 DIAGNOSIS — D649 Anemia, unspecified: Secondary | ICD-10-CM | POA: Diagnosis not present

## 2021-04-25 DIAGNOSIS — R8290 Unspecified abnormal findings in urine: Secondary | ICD-10-CM

## 2021-04-25 LAB — HEPATIC FUNCTION PANEL
ALT: 33 (ref 10–40)
AST: 50 — AB (ref 14–40)
Alkaline Phosphatase: 211 — AB (ref 25–125)
Bilirubin, Total: 0.9

## 2021-04-25 LAB — CBC AND DIFFERENTIAL
HCT: 39 — AB (ref 41–53)
Hemoglobin: 13.4 — AB (ref 13.5–17.5)
Neutrophils Absolute: 7.98
Platelets: 251 (ref 150–399)
WBC: 10.5

## 2021-04-25 LAB — BASIC METABOLIC PANEL
BUN: 15 (ref 4–21)
CO2: 20 (ref 13–22)
Chloride: 99 (ref 99–108)
Creatinine: 0.7 (ref 0.6–1.3)
Glucose: 152
Potassium: 3.9 (ref 3.4–5.3)
Sodium: 134 — AB (ref 137–147)

## 2021-04-25 LAB — COMPREHENSIVE METABOLIC PANEL
Albumin: 4.3 (ref 3.5–5.0)
Calcium: 9.2 (ref 8.7–10.7)

## 2021-04-25 LAB — CBC: RBC: 4.23 (ref 3.87–5.11)

## 2021-04-25 MED ORDER — OXYCODONE HCL 5 MG PO TABS: 5.0000 mg | ORAL_TABLET | ORAL | 0 refills | Status: DC | PRN

## 2021-04-25 MED ORDER — NITROFURANTOIN MONOHYD MACRO 100 MG PO CAPS: 100.0000 mg | ORAL_CAPSULE | Freq: Two times a day (BID) | ORAL | 0 refills | Status: DC

## 2021-04-25 NOTE — Telephone Encounter (Signed)
I called pt's wife back, appt given for 130p today.  Melissa,NP:  Can he come in? I think he was already on a dose of macrobid, so we probably do need to check him  Pt's wife called to report pt is still having dysuria. His urine is amber colored and has sediment in it. I don't see where a UA has been done.

## 2021-04-25 NOTE — Assessment & Plan Note (Signed)
He was recently treated for UTI like symptoms and has completed a course of macrobid for 7 days. Symptoms including dark colored urine with sediment and lower back pain have returned. Urinalysis today reveals 1+ protein, 1+ bilirubin, hyaline cast 13, WBC 3, moderate mucus and urobilinogen 4. He has known liver lesions which can account for the bilirubin spillover. He will repeat a course of macrobid while we wait for the culture.

## 2021-04-25 NOTE — Progress Notes (Signed)
Patient Care Team: Serita Grammes, MD as PCP - General (Family Medicine) Marin Olp Rudell Cobb, MD as Consulting Physician (Oncology) [name], MD as Consulting Physician (Oncology) Lavena Bullion, DO as Consulting Physician (Gastroenterology)  Clinic Day:  04/25/2021  Referring physician: Serita Grammes, MD  ASSESSMENT & PLAN:   Assessment & Plan: Gastric adenocarcinoma [facility]) Gastric adenocarcinoma which has unfortunately spread to the liver. He is thinking about if he wants to continue with treatment. He will discuss this issue with Dr. Bobby Rumpf at his scheduled follow up after Christmas.   UTI (urinary tract infection) He was recently treated for UTI like symptoms and has completed a course of macrobid for 7 days. Symptoms including dark colored urine with sediment and lower back pain have returned. Urinalysis today reveals 1+ protein, 1+ bilirubin, hyaline cast 13, WBC 3, moderate mucus and urobilinogen 4. He has known liver lesions which can account for the bilirubin spillover. He will repeat a course of macrobid while we wait for the culture.     The patient understands the plans discussed today and is in agreement with them.  He knows to contact our office if he develops concerns prior to his next appointment.    Melodye Ped, NP  [facility] [address] [place] [address] Dept: [phone] Dept Fax: [phone]   Orders Placed This Encounter  Procedures   CBC and differential    This external order was created through the Results Console.   CBC    This external order was created through the Results Console.   Basic metabolic panel    This external order was created through the Results Console.   Comprehensive metabolic panel    This external order was created through the Results Console.   Hepatic function panel    This external order was created through the Results Console.       CHIEF COMPLAINT:  CC: A 65 year old male with history of gastric adenocarcinoma  Current Treatment:  Awaiting decision for continued treatment  INTERVAL HISTORY:  Baraa is here today for repeat clinical assessment. He denies fevers or chills. He denies pain. His appetite is good. His weight has been stable.  I have reviewed the past medical history, past surgical history, social history and family history with the patient and they are unchanged from previous note.  ALLERGIES:  has no allergies on file.  MEDICATIONS:  Current Outpatient Medications  Medication Sig Dispense Refill   dexamethasone (DECADRON) 4 MG tablet TAKE 2 TABLETS BY MOUTH THE MORNING BEFORE AND 2 TABS THE MORNING OF CHEMOTHERAPY (Patient not taking: No sig reported) 20 tablet 0   acetaminophen (TYLENOL) 500 MG tablet Take 2 tablets (1,000 mg total) by mouth every 8 (eight) hours as needed for mild pain. 30 tablet 0   nitrofurantoin, macrocrystal-monohydrate, (MACROBID) 100 MG capsule Take 1 capsule (100 mg total) by mouth 2 (two) times daily. 14 capsule 0   ondansetron (ZOFRAN) 4 MG tablet Take 1 tablet (4 mg total) by mouth every 4 (four) hours as needed for nausea. 90 tablet 3   oxyCODONE (OXY IR/ROXICODONE) 5 MG immediate release tablet Take 1-2 tablets (5-10 mg total) by mouth every 4 (four) hours as needed (5mg  for moderate pain, 10mg  for severe pain). 90 tablet 0   pantoprazole (PROTONIX) 40 MG tablet Take 40 mg by mouth in the morning and at bedtime.     prochlorperazine (COMPAZINE) 10 MG tablet Take 1  tablet (10 mg total) by mouth every 6 (six) hours as needed for nausea or vomiting. 90 tablet 3   rosuvastatin (CRESTOR) 10 MG tablet Take 10 mg by mouth every morning.     traZODone (DESYREL) 100 MG tablet Take 100 mg by mouth at bedtime.     No current facility-administered medications for this visit.    HISTORY OF PRESENT ILLNESS:   Oncology History  Gastric adenocarcinoma ([place])  09/12/2020 Initial  Diagnosis   Gastric adenocarcinoma ([place])   10/08/2020 Cancer Staging   Staging form: Stomach, AJCC 8th Edition - Clinical stage from 10/08/2020: Stage IVA (cT4b, cN0, cM0) - Signed by [name], MD on 10/08/2020 Histopathologic type: Adenocarcinoma, NOS Stage prefix: Initial diagnosis Total positive nodes: 0    10/24/2020 - 11/22/2020 Chemotherapy   Patient is on Treatment Plan : GASTROESOPHAGEAL FLOT q14d X 4 cycles         REVIEW OF SYSTEMS:   Constitutional: Denies fevers, chills or abnormal weight loss Eyes: Denies blurriness of vision Ears, nose, mouth, throat, and face: Denies mucositis or sore throat Respiratory: Denies cough, dyspnea or wheezes Cardiovascular: Denies palpitation, chest discomfort or lower extremity swelling Gastrointestinal:  Denies nausea, heartburn or change in bowel habits Skin: Denies abnormal skin rashes Lymphatics: Denies new lymphadenopathy or easy bruising Neurological:Denies numbness, tingling or new weaknesses Behavioral/Psych: Mood is stable, no new changes  All other systems were reviewed with the patient and are negative.   VITALS:  Blood pressure (!) 158/88, pulse 82, temperature 98.2 F (36.8 C), temperature source Oral, resp. rate 18, height 5\' 3"  (1.6 m), weight 125 lb 9.6 oz (57 kg), SpO2 96 %.  Wt Readings from Last 3 Encounters:  04/25/21 125 lb 9.6 oz (57 kg)  04/15/21 125 lb 9.6 oz (57 kg)  03/19/21 130 lb 12.8 oz (59.3 kg)    Body mass index is 22.25 kg/m.  Performance status (ECOG): 1 - Symptomatic but completely ambulatory  PHYSICAL EXAM:   GENERAL:alert, no distress and comfortable SKIN: skin color, texture, turgor are normal, no rashes or significant lesions EYES: normal, Conjunctiva are pink and non-injected, sclera clear OROPHARYNX:no exudate, no erythema and lips, buccal mucosa, and tongue normal  NECK: supple, thyroid normal size, non-tender, without nodularity LYMPH:  no palpable lymphadenopathy in the  cervical, axillary or inguinal LUNGS: clear to auscultation and percussion with normal breathing effort HEART: regular rate & rhythm and no murmurs and no lower extremity edema ABDOMEN:abdomen soft, non-tender and normal bowel sounds Musculoskeletal:no cyanosis of digits and no clubbing  NEURO: alert & oriented x 3 with fluent speech, no focal motor/sensory deficits  LABORATORY DATA:  I have reviewed the data as listed    Component Value Date/Time   NA 134 (A) 04/25/2021 0000   K 3.9 04/25/2021 0000   CL 99 04/25/2021 0000   CO2 20 04/25/2021 0000   GLUCOSE 126 (H) 02/21/2021 0134   BUN 15 04/25/2021 0000   CREATININE 0.7 04/25/2021 0000   CREATININE 0.75 02/21/2021 0134   CALCIUM 9.2 04/25/2021 0000   PROT 5.8 (L) 09/26/2020 0418   ALBUMIN 4.3 04/25/2021 0000   AST 50 (A) 04/25/2021 0000   ALT 33 04/25/2021 0000   ALKPHOS 211 (A) 04/25/2021 0000   BILITOT 0.7 09/26/2020 0418   GFRNONAA >60 02/21/2021 0134    No results found for: SPEP, UPEP  Lab Results  Component Value Date   WBC 10.5 04/25/2021   NEUTROABS 7.98 04/25/2021   HGB 13.4 (A) 04/25/2021  HCT 39 (A) 04/25/2021   MCV 100.4 (H) 02/21/2021   PLT 251 04/25/2021      Chemistry      Component Value Date/Time   NA 134 (A) 04/25/2021 0000   K 3.9 04/25/2021 0000   CL 99 04/25/2021 0000   CO2 20 04/25/2021 0000   BUN 15 04/25/2021 0000   CREATININE 0.7 04/25/2021 0000   CREATININE 0.75 02/21/2021 0134   GLU 152 04/25/2021 0000      Component Value Date/Time   CALCIUM 9.2 04/25/2021 0000   ALKPHOS 211 (A) 04/25/2021 0000   AST 50 (A) 04/25/2021 0000   ALT 33 04/25/2021 0000   BILITOT 0.7 09/26/2020 0418       RADIOGRAPHIC STUDIES: I have personally reviewed the radiological images as listed and agreed with the findings in the report. No results found.

## 2021-04-25 NOTE — Assessment & Plan Note (Signed)
Gastric adenocarcinoma which has unfortunately spread to the liver. He is thinking about if he wants to continue with treatment. He will discuss this issue with Dr. Bobby Rumpf at his scheduled follow up after Christmas.

## 2021-04-25 NOTE — Telephone Encounter (Signed)
No LOS Entered 

## 2021-04-28 ENCOUNTER — Other Ambulatory Visit: Payer: Self-pay | Admitting: Hematology and Oncology

## 2021-04-30 ENCOUNTER — Telehealth: Payer: Self-pay | Admitting: Oncology

## 2021-04-30 NOTE — Telephone Encounter (Signed)
Patient rescheduled for 12/29 to 1/3 due to Dr Bobby Rumpf being out-of-town.  Patient notified

## 2021-05-14 NOTE — Progress Notes (Incomplete)
[facility]  [address] [place],  [place]  [address] [phone]  Clinic Day:  05/14/2021  Referring physician: Burney Gauze, MD  This document serves as a record of services personally performed by Dequincy Macarthur Critchley, MD. It was created on their behalf by [facility] E, a trained medical scribe. The creation of this record is based on the scribe's personal observations and the provider's statements to them.  HISTORY OF PRESENT ILLNESS:  The patient is a 65 y.o. male who now has metastatic gastric cancer.  While undergoing potential surgery to remove his cancer, inspection showed lesions on his liver surface, which were biopsy proven to be metastatic gastric cancer.  Based upon these findings, his surgery was cancelled. I recommended second line therapy consisting of paclitaxel/ramucirumab, but the patient wished to take some time to deliberate. He comes in today for follow up.  Since his last visit, the patient has been doing fairly well.  However, he has begun to have more abdominal pain to where he has become more reliant upon his oxycodone pain medication.    This gentleman was initially diagnosed with stage IVA (T4b N0 M0) gastric adenocarcinoma, with his disease abutting his pancreas.  He received 3 of 4 planned cycles of neoadjuvant FLOT chemotherapy.  The significant decline in his health prevented him from receiving his last cycle of FLOT.    PHYSICAL EXAM:  There were no vitals taken for this visit. Wt Readings from Last 3 Encounters:  04/25/21 125 lb 9.6 oz (57 kg)  04/15/21 125 lb 9.6 oz (57 kg)  03/19/21 130 lb 12.8 oz (59.3 kg)   There is no height or weight on file to calculate BMI. Performance status (ECOG): 3 Physical Exam Constitutional:      Appearance: He is not ill-appearing.  HENT:     Mouth/Throat:     Mouth: Mucous membranes are moist.     Pharynx: Oropharynx is clear. No oropharyngeal exudate or posterior oropharyngeal  erythema.  Cardiovascular:     Rate and Rhythm: Normal rate and regular rhythm.     Heart sounds: No murmur heard.   No friction rub. No gallop.  Pulmonary:     Effort: Pulmonary effort is normal. No respiratory distress.     Breath sounds: Normal breath sounds. No wheezing, rhonchi or rales.  Abdominal:     General: Bowel sounds are normal. There is no distension.     Palpations: Abdomen is soft. There is no mass.     Tenderness: There is no abdominal tenderness.     Comments: No palpable abdominal masses.    Musculoskeletal:        General: No swelling.     Right lower leg: No edema.     Left lower leg: No edema.  Lymphadenopathy:     Cervical: No cervical adenopathy.     Upper Body:     Right upper body: No supraclavicular or axillary adenopathy.     Left upper body: No supraclavicular or axillary adenopathy.     Lower Body: No right inguinal adenopathy. No left inguinal adenopathy.  Skin:    General: Skin is warm.     Coloration: Skin is not jaundiced.     Findings: No lesion or rash.  Neurological:     General: No focal deficit present.     Mental Status: He is alert and oriented to person, place, and time. Mental status is at baseline.  Psychiatric:  Mood and Affect: Mood normal.        Behavior: Behavior normal.        Thought Content: Thought content normal.    ASSESSMENT & PLAN:  A 65 y.o. male with metastatic gastric adenocarcinoma.  He has multiple metastatic lesions within his liver.  What concerns me is that his disease progressed despite being on a 4-drug regimen of FLOT chemotherapy.  Moving forward, he understands the main goal of his treatment is disease control.  Of note, this gentleman's tissue was sent to [facility] for additional testing.  Unfortunately, testing did not show where his cancer would be sensitive to any form of targeted therapy.  Moving forward, his second-line treatment will consist of paclitaxel/ramucirumab.  Paclitaxel would be  given weekly, every 3 out of 4 weeks; ramucirumab would be given every 2 weeks.  He was made aware of the side effects which go along with this regimen, including peripheral neuropathy, alopecia, cytopenias, and hypertension.  I also gave the patient the option of seeking a second opinion, particularly to determine if there may be a clinical trial for which he may be eligible for at a local institution.  Currently, the patient is uncertain as to what he wished to do.  He wants to take a few days to consider all of his options before coming to a decision.  I will tentatively see him back in 1 month for repeat clinical assessment.  However, he knows to call us as soon as possible when he has made a decision about what he wishes to do next for his palliative disease management.  Both the patient and his wife understand all the plans discussed today and are in agreement with him.    I, Rita Ohara, am acting as scribe for [name], MD    I have reviewed this report as typed by the medical scribe, and it is complete and accurate.  Dequincy Macarthur Critchley, MD

## 2021-05-15 ENCOUNTER — Ambulatory Visit: Payer: PPO | Admitting: Oncology

## 2021-05-20 ENCOUNTER — Inpatient Hospital Stay: Payer: PPO | Attending: Oncology | Admitting: Oncology

## 2021-05-20 ENCOUNTER — Encounter: Payer: Self-pay | Admitting: Oncology

## 2021-05-20 ENCOUNTER — Other Ambulatory Visit (INDEPENDENT_AMBULATORY_CARE_PROVIDER_SITE_OTHER): Payer: PPO | Admitting: Oncology

## 2021-05-20 VITALS — BP 208/99 | HR 105 | Temp 98.0°F | Resp 16 | Ht 63.0 in | Wt 112.0 lb

## 2021-05-20 DIAGNOSIS — Z79899 Other long term (current) drug therapy: Secondary | ICD-10-CM | POA: Insufficient documentation

## 2021-05-20 DIAGNOSIS — C160 Malignant neoplasm of cardia: Secondary | ICD-10-CM | POA: Insufficient documentation

## 2021-05-20 DIAGNOSIS — Z87891 Personal history of nicotine dependence: Secondary | ICD-10-CM | POA: Insufficient documentation

## 2021-05-20 DIAGNOSIS — R1013 Epigastric pain: Secondary | ICD-10-CM | POA: Diagnosis not present

## 2021-05-20 DIAGNOSIS — G8929 Other chronic pain: Secondary | ICD-10-CM

## 2021-05-20 DIAGNOSIS — I10 Essential (primary) hypertension: Secondary | ICD-10-CM | POA: Insufficient documentation

## 2021-05-20 DIAGNOSIS — Z808 Family history of malignant neoplasm of other organs or systems: Secondary | ICD-10-CM | POA: Insufficient documentation

## 2021-05-20 DIAGNOSIS — Z833 Family history of diabetes mellitus: Secondary | ICD-10-CM | POA: Insufficient documentation

## 2021-05-20 DIAGNOSIS — C169 Malignant neoplasm of stomach, unspecified: Secondary | ICD-10-CM

## 2021-05-20 DIAGNOSIS — Z8249 Family history of ischemic heart disease and other diseases of the circulatory system: Secondary | ICD-10-CM | POA: Insufficient documentation

## 2021-05-20 MED ORDER — OXYCODONE HCL 5 MG PO TABS: ORAL_TABLET | ORAL | 0 refills | Status: AC

## 2021-05-20 MED ORDER — MORPHINE SULFATE ER 30 MG PO TBCR: 30.0000 mg | EXTENDED_RELEASE_TABLET | Freq: Two times a day (BID) | ORAL | 0 refills | Status: AC

## 2021-05-20 MED ORDER — MEGESTROL ACETATE 400 MG/10ML PO SUSP
800.0000 mg | Freq: Every day | ORAL | 0 refills | Status: AC
Start: 2021-05-20 — End: ?

## 2021-05-20 NOTE — Progress Notes (Signed)
Spouse states that patient has very little appetite, no PEG, eating very little, increased fatigue (sleeping most of the time) cold, some confusion and hallucinations.

## 2021-05-20 NOTE — Progress Notes (Deleted)
DISCONTINUE OFF PATHWAY REGIMEN - Gastroesophageal ° ° °OFF11089:Docetaxel + Oxaliplatin + Leucovorin + Fluorouracil CIV q14 Days (FLOT): °  A cycle is every 14 days: °    Docetaxel  °    Oxaliplatin  °    Leucovorin  °    Fluorouracil  ° °**Always confirm dose/schedule in your pharmacy ordering system** ° °REASON: Disease Progression °PRIOR TREATMENT: Off Pathway: Docetaxel + Oxaliplatin + Leucovorin + Fluorouracil CIV q14 Days (FLOT) °TREATMENT RESPONSE: Progressive Disease (PD) ° °START ON PATHWAY REGIMEN - Gastroesophageal ° ° °  A cycle is every 28 days: °    Ramucirumab  °    Paclitaxel  ° °**Always confirm dose/schedule in your pharmacy ordering system** ° °Patient Characteristics: °Distant Metastases (cM1/pM1) / Locally Recurrent Disease, Adenocarcinoma - Esophageal, GE Junction, and Gastric, Second Line, MSS/pMMR or MSI Unknown °Histology: Adenocarcinoma °Disease Classification: Gastric °Therapeutic Status: Distant Metastases (No Additional Staging) °Line of Therapy: Second Line °Microsatellite/Mismatch Repair Status: MSS/pMMR °Intent of Therapy: °Non-Curative / Palliative Intent, Discussed with Patient °

## 2021-05-20 NOTE — Progress Notes (Signed)
[name]  [address] [place],  [place]  [address] [phone]  Clinic Day:  05/20/2021  Referring physician: Burney Gauze, MD  This document serves as a record of services personally performed by [name], MD. It was created on their behalf by [facility] E, a trained medical scribe. The creation of this record is based on the scribe's personal observations and the provider's statements to them.  HISTORY OF PRESENT ILLNESS:  The patient is a 66 y.o. male with metastatic gastric cancer.  Recent scans showed disease progression within his liver.  He comes in today to discuss what he wishes to do for his disease management.  The patient admits that he is weaker versus what he has been previously.  His wife is also concerned as she has noticed progressive weight loss, as well as decreased appetite.  He is also beginning to have more right upper quadrant abdominal discomfort.  Although he feels weak, he wishes to trial anything to see if his disease burden can be reduced.  This gentleman was initially diagnosed with stage IVA (T4b N0 M0) gastric adenocarcinoma, with his disease abutting his pancreas.  He received 3 of 4 planned cycles of neoadjuvant FLOT chemotherapy.  The significant decline in his health prevented him from receiving his last cycle of FLOT.  In the process of undergoing surgery to remove his gastric mass, intraoperative inspection showed disease studding his rubber, which led to his surgery being discontinued.  PHYSICAL EXAM:  There were no vitals taken for this visit. Wt Readings from Last 3 Encounters:  05/20/21 112 lb (50.8 kg)  04/25/21 125 lb 9.6 oz (57 kg)  04/15/21 125 lb 9.6 oz (57 kg)   There is no height or weight on file to calculate BMI. Performance status (ECOG): 3 Physical Exam Constitutional:      Appearance: He is ill-appearing.     Comments: He looks weaker and better versus previous visits.  HENT:      Mouth/Throat:     Mouth: Mucous membranes are moist.     Pharynx: Oropharynx is clear. No oropharyngeal exudate or posterior oropharyngeal erythema.  Cardiovascular:     Rate and Rhythm: Normal rate and regular rhythm.     Heart sounds: No murmur heard.   No friction rub. No gallop.  Pulmonary:     Effort: Pulmonary effort is normal. No respiratory distress.     Breath sounds: Normal breath sounds. No wheezing, rhonchi or rales.  Abdominal:     General: Bowel sounds are normal. There is no distension.     Palpations: Abdomen is soft. There is no mass.     Tenderness: There is no abdominal tenderness.     Comments: No palpable abdominal masses.    Musculoskeletal:        General: No swelling.     Right lower leg: No edema.     Left lower leg: No edema.  Lymphadenopathy:     Cervical: No cervical adenopathy.     Upper Body:     Right upper body: No supraclavicular or axillary adenopathy.     Left upper body: No supraclavicular or axillary adenopathy.     Lower Body: No right inguinal adenopathy. No left inguinal adenopathy.  Skin:    General: Skin is warm.     Coloration: Skin is not jaundiced.     Findings: No lesion or rash.  Neurological:     General: No focal deficit present.  Mental Status: He is alert and oriented to person, place, and time. Mental status is at baseline.  Psychiatric:        Mood and Affect: Mood normal.        Behavior: Behavior normal.        Thought Content: Thought content normal.    ASSESSMENT & PLAN:  A 66 y.o. male with metastatic gastric adenocarcinoma.  Per his request, I will give him second line palliative chemotherapy, which will consist of paclitaxel/ramucirumab.  As this gentleman has an ECOG 3 performance status, I do not feel comfortable giving him standard doses of paclitaxel.  His weekly paclitaxel  will initially start with a 25% dose reduction.  With respect to his increasing abdominal discomfort, I will start him on long-acting  morphine 30 mg twice daily.  He knows to continue taking oxycodone at 10 to 15 mg every 4-6 hours as needed for breakthrough pain.  Due to his waning appetite, Megace 800 mg will be prescribed daily.  The patient understands I am apprehensive with respect to starting second line palliative treatment, particularly as his ECOG performance status has fallen over these past months.  If I see any signs of him poorly tolerating his paclitaxel/ramucirumab therapy, I will make ultimate decision to discontinue this regimen and place him under hospice care.  His first cycle of treatment will commence next week.  I will see him back 4 weeks later before he heads into his second cycle of paclitaxel/ramucirumab.  Both the patient and his wife understand all the plans discussed today and are in agreement with him.    I, Rita Ohara, am acting as scribe for [name], MD    I have reviewed this report as typed by the medical scribe, and it is complete and accurate.  Dillen Belmontes Macarthur Critchley, MD

## 2021-05-20 NOTE — Progress Notes (Signed)
DISCONTINUE OFF PATHWAY REGIMEN - Gastroesophageal   [id number]:Docetaxel + Oxaliplatin + Leucovorin + Fluorouracil CIV q14 Days (FLOT):   A cycle is every 14 days:     Docetaxel      Oxaliplatin      Leucovorin      Fluorouracil   **Always confirm dose/schedule in your pharmacy ordering system**  REASON: Disease Progression PRIOR TREATMENT: Off Pathway: Docetaxel + Oxaliplatin + Leucovorin + Fluorouracil CIV q14 Days (FLOT) TREATMENT RESPONSE: Progressive Disease (PD)  START ON PATHWAY REGIMEN - Gastroesophageal     A cycle is every 28 days:     Ramucirumab      Paclitaxel   **Always confirm dose/schedule in your pharmacy ordering system**  Patient Characteristics: Distant Metastases (cM1/pM1) / Locally Recurrent Disease, Adenocarcinoma - Esophageal, GE Junction, and Gastric, Second Line, MSS/pMMR or MSI Unknown Histology: Adenocarcinoma Disease Classification: Gastric Therapeutic Status: Distant Metastases (No Additional Staging) Line of Therapy: Second Line Microsatellite/Mismatch Repair Status: MSS/pMMR Intent of Therapy: Non-Curative / Palliative Intent, Discussed with Patient

## 2021-05-21 ENCOUNTER — Other Ambulatory Visit: Payer: Self-pay

## 2021-05-21 ENCOUNTER — Inpatient Hospital Stay: Payer: PPO

## 2021-05-21 DIAGNOSIS — Z87891 Personal history of nicotine dependence: Secondary | ICD-10-CM | POA: Diagnosis not present

## 2021-05-21 DIAGNOSIS — I10 Essential (primary) hypertension: Secondary | ICD-10-CM | POA: Diagnosis not present

## 2021-05-21 DIAGNOSIS — Z833 Family history of diabetes mellitus: Secondary | ICD-10-CM | POA: Diagnosis not present

## 2021-05-21 DIAGNOSIS — Z8249 Family history of ischemic heart disease and other diseases of the circulatory system: Secondary | ICD-10-CM | POA: Diagnosis not present

## 2021-05-21 DIAGNOSIS — C160 Malignant neoplasm of cardia: Secondary | ICD-10-CM | POA: Diagnosis not present

## 2021-05-21 DIAGNOSIS — Z808 Family history of malignant neoplasm of other organs or systems: Secondary | ICD-10-CM | POA: Diagnosis not present

## 2021-05-21 DIAGNOSIS — Z79899 Other long term (current) drug therapy: Secondary | ICD-10-CM | POA: Diagnosis not present

## 2021-05-21 DIAGNOSIS — C169 Malignant neoplasm of stomach, unspecified: Secondary | ICD-10-CM

## 2021-05-21 LAB — TOTAL PROTEIN, URINE DIPSTICK: Protein, ur: NEGATIVE mg/dL

## 2021-05-26 ENCOUNTER — Other Ambulatory Visit: Payer: Self-pay

## 2021-05-26 ENCOUNTER — Encounter: Payer: Self-pay | Admitting: Hematology and Oncology

## 2021-05-26 ENCOUNTER — Inpatient Hospital Stay (INDEPENDENT_AMBULATORY_CARE_PROVIDER_SITE_OTHER): Payer: PPO | Admitting: Hematology and Oncology

## 2021-05-26 ENCOUNTER — Inpatient Hospital Stay: Payer: PPO

## 2021-05-26 VITALS — BP 120/80 | HR 88 | Temp 98.3°F | Resp 20 | Ht 63.0 in | Wt 110.7 lb

## 2021-05-26 DIAGNOSIS — C169 Malignant neoplasm of stomach, unspecified: Secondary | ICD-10-CM | POA: Diagnosis not present

## 2021-05-26 DIAGNOSIS — C160 Malignant neoplasm of cardia: Secondary | ICD-10-CM | POA: Diagnosis not present

## 2021-05-26 LAB — COMPREHENSIVE METABOLIC PANEL
Albumin: 4.2 (ref 3.5–5.0)
Calcium: 9.2 (ref 8.7–10.7)

## 2021-05-26 LAB — BASIC METABOLIC PANEL WITH GFR
BUN: 18 (ref 4–21)
CO2: 28 — AB (ref 13–22)
Chloride: 85 — AB (ref 99–108)
Creatinine: 0.9 (ref 0.6–1.3)
Glucose: 112
Potassium: 3.3 — AB (ref 3.4–5.3)
Sodium: 128 — AB (ref 137–147)

## 2021-05-26 LAB — CBC: RBC: 4.68 (ref 3.87–5.11)

## 2021-05-26 LAB — HEPATIC FUNCTION PANEL
ALT: 61 — AB (ref 10–40)
AST: 91 — AB (ref 14–40)
Alkaline Phosphatase: 432 — AB (ref 25–125)
Bilirubin, Total: 2

## 2021-05-26 LAB — CBC AND DIFFERENTIAL
HCT: 41 (ref 41–53)
Hemoglobin: 13.6 (ref 13.5–17.5)
Neutrophils Absolute: 8.47
Platelets: 278 (ref 150–399)
WBC: 11.6

## 2021-05-26 LAB — TSH: TSH: 1.411 u[IU]/mL (ref 0.350–4.500)

## 2021-05-26 MED ORDER — BACLOFEN 10 MG PO TABS: 10.0000 mg | ORAL_TABLET | Freq: Three times a day (TID) | ORAL | 0 refills | Status: DC

## 2021-05-26 NOTE — Progress Notes (Cosign Needed Addendum)
[facility] NOTE Patient Care Team: Serita Grammes, MD as PCP - General (Family Medicine) Marin Olp Rudell Cobb, MD as Consulting Physician (Oncology) [name], MD as Consulting Physician (Oncology) Lavena Bullion, DO as Consulting Physician (Gastroenterology)   Name of the patient: Alexander [name]  [id number]  1956-02-24   Date of visit: 05/26/21  Diagnosis- Gastroesophageal cancer  Chief complaint/Reason for visit- Initial Meeting for [facility], preparing for starting chemotherapy   Heme/Onc history:  Oncology History  Gastric adenocarcinoma ([place])  09/12/2020 Initial Diagnosis   Gastric adenocarcinoma ([place])   10/08/2020 Cancer Staging   Staging form: Stomach, AJCC 8th Edition - Clinical stage from 10/08/2020: Stage IVA (cT4b, cN0, cM0) - Signed by [name], MD on 10/08/2020 Histopathologic type: Adenocarcinoma, NOS Stage prefix: Initial diagnosis Total positive nodes: 0    10/24/2020 - 11/22/2020 Chemotherapy   Patient is on Treatment Plan : GASTROESOPHAGEAL FLOT q14d X 4 cycles     05/28/2021 -  Chemotherapy   Patient is on Treatment Plan : GASTROESOPHAGEAL Ramucirumab D1, 15  / PACLitaxel D1,8,15 q28d       Interval history-  Patient presents to chemo care clinic today for initial meeting in preparation for starting chemotherapy. I introduced the chemo care clinic and we discussed that the role of the clinic is to assist those who are at an increased risk of emergency room visits and/or complications during the course of chemotherapy treatment. We discussed that the increased risk takes into account factors such as age, performance status, and co-morbidities. We also discussed that for some, this might include barriers to care such as not having a primary care provider, lack of insurance/transportation, or not being able to afford medications. We discussed that the goal of the program is to help prevent unplanned ER visits and help reduce  complications during chemotherapy. We do this by discussing specific risk factors to each individual and identifying ways that we can help improve these risk factors and reduce barriers to care.   No Known Allergies  Past Medical History:  Diagnosis Date   Cancer ([place])    gastric cancer   COPD (chronic obstructive pulmonary disease) ([place])    GERD without esophagitis 01/04/2020   [facility]   Hypertension     Past Surgical History:  Procedure Laterality Date   APPENDECTOMY     GASTROJEJUNOSTOMY  09/20/2020   Procedure: GASTROJEJUNOSTOMY;  Surgeon: Dwan Bolt, MD;  Location: [facility] OR;  Service: General;;   HERNIA REPAIR     age 81 or 2    IR IMAGING GUIDED PORT INSERTION  10/01/2020   JEJUNOSTOMY N/A 09/20/2020   Procedure: FEEDING JEJUNOSTOMY;  Surgeon: Dwan Bolt, MD;  Location: [place] [facility] OR;  Service: General;  Laterality: N/A;   LAPAROSCOPY N/A 02/20/2021   Procedure: STAGING LAPAROSCOPY;  Surgeon: Dwan Bolt, MD;  Location: [place] OR;  Service: General;  Laterality: N/A;   LAPAROTOMY N/A 02/20/2021   Procedure: EXPLORATORY LAPAROTOMY;  Surgeon: Dwan Bolt, MD;  Location: [place];  Service: General;  Laterality: N/A;   LIVER BIOPSY N/A 02/20/2021   Procedure: LIVER BIOPSY;  Surgeon: Dwan Bolt, MD;  Location: [place];  Service: General;  Laterality: N/A;    Social History   Socioeconomic History   Marital status: Married    Spouse name: Not on file   Number of children: Not on file   Years of education: Not on file   Highest education level: Not  on file  Occupational History   Not on file  Tobacco Use   Smoking status: Former    Types: Cigars    Quit date: 04/2019    Years since quitting: 2.1   Smokeless tobacco: Never   Tobacco comments:    35-40 pack years  Vaping Use   Vaping Use: Never used  Substance and Sexual Activity   Alcohol use: Not Currently    Comment: doesnt drink anymore. stopped in 2021.  Previously drinking 6 pack of beer on  almost daily basis.   Drug use: Not Currently   Sexual activity: Not on file  Other Topics Concern   Not on file  Social History Narrative   Not on file   Social Determinants of Health   Financial Resource Strain: Not on file  Food Insecurity: Not on file  Transportation Needs: Not on file  Physical Activity: Not on file  Stress: Not on file  Social Connections: Not on file  Intimate Partner Violence: Not on file    Family History  Problem Relation Age of Onset   Diabetes Sister    Hypertension Brother    Brain cancer Father    Cancer Neg Hx    Stroke Neg Hx    Heart attack Neg Hx      Current Outpatient Medications:    dexamethasone (DECADRON) 4 MG tablet, TAKE 2 TABLETS BY MOUTH THE MORNING BEFORE AND 2 TABS THE MORNING OF CHEMOTHERAPY (Patient not taking: No sig reported), Disp: 20 tablet, Rfl: 0   acetaminophen (TYLENOL) 500 MG tablet, Take 2 tablets (1,000 mg total) by mouth every 8 (eight) hours as needed for mild pain., Disp: 30 tablet, Rfl: 0   megestrol (MEGACE) 400 MG/10ML suspension, Take 20 mLs (800 mg total) by mouth daily., Disp: 600 mL, Rfl: 0   morphine (MS CONTIN) 30 MG 12 hr tablet, Take 1 tablet (30 mg total) by mouth every 12 (twelve) hours., Disp: 60 tablet, Rfl: 0   nitrofurantoin, macrocrystal-monohydrate, (MACROBID) 100 MG capsule, Take 1 capsule (100 mg total) by mouth 2 (two) times daily., Disp: 14 capsule, Rfl: 0   ondansetron (ZOFRAN) 4 MG tablet, Take 1 tablet (4 mg total) by mouth every 4 (four) hours as needed for nausea., Disp: 90 tablet, Rfl: 3   oxyCODONE (OXY IR/ROXICODONE) 5 MG immediate release tablet, 2-3 tabs po  q4-6 hours prn breakthrough pain, Disp: 360 tablet, Rfl: 0   pantoprazole (PROTONIX) 40 MG tablet, Take 40 mg by mouth in the morning and at bedtime., Disp: , Rfl:    prochlorperazine (COMPAZINE) 10 MG tablet, Take 1 tablet (10 mg total) by mouth every 6 (six) hours as needed for nausea or vomiting., Disp: 90 tablet, Rfl: 3    rosuvastatin (CRESTOR) 10 MG tablet, Take 10 mg by mouth every morning., Disp: , Rfl:    traZODone (DESYREL) 100 MG tablet, Take 100 mg by mouth at bedtime., Disp: , Rfl:   CMP Latest Ref Rng & Units 04/25/2021  Glucose 70 - 99 mg/dL -  BUN 4 - 21 15  Creatinine 0.6 - 1.3 0.7  Sodium 137 - 147 134(A)  Potassium 3.4 - 5.3 3.9  Chloride 99 - 108 99  CO2 13 - 22 20  Calcium 8.7 - 10.7 9.2  Total Protein 6.5 - 8.1 g/dL -  Total Bilirubin 0.3 - 1.2 mg/dL -  Alkaline Phos 25 - 125 211(A)  AST 14 - 40 50(A)  ALT 10 - 40 33   CBC Latest  Ref Rng & Units 04/25/2021  WBC - 10.5  Hemoglobin 13.5 - 17.5 13.4(A)  Hematocrit 41 - 53 39(A)  Platelets 150 - 399 251    No images are attached to the encounter.  No results found.   Assessment and plan- Patient is a 66 y.o. male who presents to [facility] for initial meeting in preparation for starting chemotherapy for the treatment of gastroesophageal cancer.   Chemo Care Clinic/High Risk for ER/Hospitalization during chemotherapy- We discussed the role of the chemo care clinic and identified patient specific risk factors. I discussed that patient was identified as high risk primarily based on:  Patient has past medical history positive for: Past Medical History:  Diagnosis Date   Cancer ([place])    gastric cancer   COPD (chronic obstructive pulmonary disease) ([place])    GERD without esophagitis 01/04/2020   [facility]   Hypertension     Patient has past surgical history positive for: Past Surgical History:  Procedure Laterality Date   APPENDECTOMY     GASTROJEJUNOSTOMY  09/20/2020   Procedure: GASTROJEJUNOSTOMY;  Surgeon: Dwan Bolt, MD;  Location: [facility] OR;  Service: General;;   HERNIA REPAIR     age 58 or 2    IR IMAGING GUIDED PORT INSERTION  10/01/2020   JEJUNOSTOMY N/A 09/20/2020   Procedure: FEEDING JEJUNOSTOMY;  Surgeon: Dwan Bolt, MD;  Location: [place];  Service: General;  Laterality: N/A;   LAPAROSCOPY  N/A 02/20/2021   Procedure: STAGING LAPAROSCOPY;  Surgeon: Dwan Bolt, MD;  Location: [facility] OR;  Service: General;  Laterality: N/A;   LAPAROTOMY N/A 02/20/2021   Procedure: EXPLORATORY LAPAROTOMY;  Surgeon: Dwan Bolt, MD;  Location: [place];  Service: General;  Laterality: N/A;   LIVER BIOPSY N/A 02/20/2021   Procedure: LIVER BIOPSY;  Surgeon: Dwan Bolt, MD;  Location: [place];  Service: General;  Laterality: N/A;   Provided general information including the following: 1.  Date of education: 05/26/2021 2.  Physician name: Dr. Bobby Rumpf 3.  Diagnosis: Gastroesophageal Cancer 4.  Stage: Stage IVA 5.  Palliative 6.  Chemotherapy plan including drugs and how often: Ramucirumab/ Paclitaxel every 4 weeks x 6 cycles 7.  Start date: 05/28/2021 8.  Other referrals: None at this time 9.  The patient is to call our office with any questions or concerns.  Our office number [phone], if after hours or on the weekend, call the same number and wait for the answering service.  There is always an oncologist on call 10.  Medications prescribed: Ondansetron, Prochlorperazine 11.  The patient has verbalized understanding of the treatment plan and has no barriers to adherence or understanding.  Obtained signed consent from patient.  Discussed symptoms including 1.  Low blood counts including red blood cells, white blood cells and platelets. 2. Infection including to avoid large crowds, wash hands frequently, and stay away from people who were sick.  If fever develops of 100.4 or higher, call our office. 3.  Mucositis-given instructions on mouth rinse (baking soda and salt mixture).  Keep mouth clean.  Use soft bristle toothbrush.  If mouth sores develop, call our clinic. 4.  Nausea/vomiting-gave prescriptions for ondansetron 4 mg every 4 hours as needed for nausea, may take around the clock if persistent.  Compazine 10 mg every 6 hours, may take around the clock if persistent. 5.  Diarrhea-use  over-the-counter Imodium.  Call clinic if not controlled. 6.  Constipation-use senna, 1 to 2 tablets twice  a day.  If no BM in 2 to 3 days call the clinic. 7.  Loss of appetite-try to eat small meals every 2-3 hours.  Call clinic if not eating. 8.  Taste changes-zinc 500 mg daily.  If becomes severe call clinic. 9.  Alcoholic beverages. 10.  Drink 2 to 3 quarts of water per day. 11.  Peripheral neuropathy-patient to call if numbness or tingling in hands or feet is persistent  Neulasta-will be given 24 to 48 hours after chemotherapy.  Gave information sheet on bone and joint pain.  Use Claritin or Pepcid.  May use ibuprofen or Aleve.  Call if symptoms persist or are unbearable.  Answered questions to patient satisfaction.  Patient is to call with any further questions or concerns.   The medication prescribed to the patient will be printed out from [facility] This will give the following information: Name of your medication Approved uses Dose and schedule Storage and handling Handling body fluids and waste Drug and food interactions Possible side effects and management Pregnancy, sexual activity, and contraception Obtaining medication   We discussed that social determinants of health may have significant impacts on health and outcomes for cancer patients.  Today we discussed specific social determinants of performance status, alcohol use, depression, financial needs, food insecurity, housing, interpersonal violence, social connections, stress, tobacco use, and transportation.    After lengthy discussion the following were identified as areas of need:   Outpatient services: We discussed options including home based and outpatient services, DME and care program. We discusssed that patients who participate in regular physical activity report fewer negative impacts of cancer and treatments and report less fatigue.   Financial Concerns: We discussed that living with cancer can create  tremendous financial burden.  We discussed options for assistance. I asked that if assistance is needed in affording medications or paying bills to please let us know so that we can provide assistance. We discussed options for food including social services.  We will also notify Mort Sawyers to see if cancer center can provide additional support.  Referral to Social work: Introduced Education officer, museum Mort Sawyers and the services he can provide such as support with utility bill, cell phone and gas vouchers.   Support groups: We discussed options for support groups at the cancer center. If interested, please notify nurse navigator to enroll. We discussed options for managing stress including healthy eating, exercise as well as participating in no charge counseling services at the cancer center and support groups.  If these are of interest, patient can notify either myself or primary nursing team.We discussed options for management including medications and referral to quit Smart program  Transportation: We discussed options for transportation.  I have notified primary oncology team who will help assist with arranging Lucianne Lei transportation for appointments when/if needed. We also discussed options for transportation on short notice/acute visits.  Palliative care services: We have palliative care services available in the cancer center to discuss goals of care and advanced care planning.  Please let us know if you have any questions or would like to speak to our palliative nurse practitioner.  Symptom Management Clinic: We discussed our symptom management clinic which is available for acute concerns while receiving treatment such as nausea, vomiting or diarrhea.  We can be reached via telephone at [phone] or through my chart. This number may also be used for rescheduling or cancellation of appointments. We are available for virtual or in person visits on the same day from  830 to 4 PM Monday through Friday. He  denies needing specific assistance at this time and He will be followed by Dr. Bobby Rumpf clinical team.  Plan: Discussed symptom management clinic. Discussed palliative care services. Discussed resources that are available here at the cancer center. Discussed medications and new prescriptions to begin treatment such as anti-nausea or steroids.   Disposition: RTC on 06/10/2020 for mid-cycle visit.  Visit Diagnosis Gastroesophageal cancer  Patient expressed understanding and was in agreement with this plan. He also understands that He can call clinic at any time with any questions, concerns, or complaints.   I provided 30 minutes of  face to face  during this encounter, and > 50% was spent counseling as documented under my assessment & plan.   Dayton Scrape, FNP-BC

## 2021-05-27 ENCOUNTER — Inpatient Hospital Stay: Payer: PPO

## 2021-05-27 ENCOUNTER — Encounter: Payer: Self-pay | Admitting: Hematology and Oncology

## 2021-05-27 ENCOUNTER — Encounter: Payer: Self-pay | Admitting: Oncology

## 2021-05-27 DIAGNOSIS — C160 Malignant neoplasm of cardia: Secondary | ICD-10-CM | POA: Diagnosis not present

## 2021-05-27 DIAGNOSIS — C169 Malignant neoplasm of stomach, unspecified: Secondary | ICD-10-CM

## 2021-05-27 LAB — T4: T4, Total: 13.5 ug/dL — ABNORMAL HIGH (ref 4.5–12.0)

## 2021-05-27 LAB — TOTAL PROTEIN, URINE DIPSTICK: Protein, ur: 30 mg/dL — AB

## 2021-05-27 MED ORDER — BACLOFEN 10 MG PO TABS: 10.0000 mg | ORAL_TABLET | Freq: Three times a day (TID) | ORAL | 0 refills | Status: AC

## 2021-05-27 MED ORDER — PROCHLORPERAZINE MALEATE 10 MG PO TABS
10.0000 mg | ORAL_TABLET | Freq: Four times a day (QID) | ORAL | 3 refills | Status: AC | PRN
Start: 2021-05-27 — End: ?

## 2021-05-27 MED ORDER — ONDANSETRON HCL 4 MG PO TABS
4.0000 mg | ORAL_TABLET | ORAL | 3 refills | Status: AC | PRN
Start: 2021-05-27 — End: ?

## 2021-05-27 MED ORDER — DEXAMETHASONE 4 MG PO TABS: ORAL_TABLET | ORAL | 0 refills | Status: AC

## 2021-05-27 MED FILL — Dexamethasone Sodium Phosphate Inj 100 MG/10ML: INTRAMUSCULAR | Qty: 1 | Status: AC

## 2021-05-27 MED FILL — Ramucirumab IV Soln 500 MG/50ML (For Infusion): INTRAVENOUS | Qty: 40 | Status: AC

## 2021-05-27 MED FILL — Paclitaxel IV Conc 300 MG/50ML (6 MG/ML): INTRAVENOUS | Qty: 15 | Status: AC

## 2021-05-27 MED FILL — Famotidine in NaCl 0.9% IV Soln 20 MG/50ML: INTRAVENOUS | Qty: 100 | Status: AC

## 2021-05-27 NOTE — Addendum Note (Signed)
Addended by: Dayton Scrape on: 05/27/2021 02:43 PM   Modules accepted: Orders

## 2021-05-28 ENCOUNTER — Inpatient Hospital Stay: Payer: PPO

## 2021-05-28 ENCOUNTER — Encounter: Payer: Self-pay | Admitting: Hematology and Oncology

## 2021-05-28 ENCOUNTER — Other Ambulatory Visit: Payer: Self-pay

## 2021-05-28 ENCOUNTER — Encounter: Payer: Self-pay | Admitting: Oncology

## 2021-05-28 VITALS — BP 122/79 | HR 62 | Temp 98.9°F | Resp 18 | Ht 63.0 in | Wt 113.0 lb

## 2021-05-28 DIAGNOSIS — C160 Malignant neoplasm of cardia: Secondary | ICD-10-CM | POA: Diagnosis not present

## 2021-05-28 DIAGNOSIS — C169 Malignant neoplasm of stomach, unspecified: Secondary | ICD-10-CM

## 2021-05-28 MED ORDER — SODIUM CHLORIDE 0.9 % IV SOLN
8.0000 mg/kg | Freq: Once | INTRAVENOUS | Status: AC
  Administered 2021-05-28: 400 mg via INTRAVENOUS
  Filled 2021-05-28: qty 40

## 2021-05-28 MED ORDER — HEPARIN SOD (PORK) LOCK FLUSH 100 UNIT/ML IV SOLN
500.0000 [IU] | Freq: Once | INTRAVENOUS | Status: AC | PRN
  Administered 2021-05-28: 500 [IU]

## 2021-05-28 MED ORDER — SODIUM CHLORIDE 0.9 % IV SOLN
60.0000 mg/m2 | Freq: Once | INTRAVENOUS | Status: AC
  Administered 2021-05-28: 90 mg via INTRAVENOUS
  Filled 2021-05-28: qty 15

## 2021-05-28 MED ORDER — POTASSIUM CHLORIDE 10 MEQ/100ML IV SOLN
10.0000 meq | Freq: Once | INTRAVENOUS | Status: AC
  Administered 2021-05-28: 10 meq via INTRAVENOUS
  Filled 2021-05-28: qty 100

## 2021-05-28 MED ORDER — SODIUM CHLORIDE 0.9% FLUSH
10.0000 mL | INTRAVENOUS | Status: DC | PRN
  Administered 2021-05-28: 10 mL

## 2021-05-28 MED ORDER — SODIUM CHLORIDE 0.9 % IV SOLN
10.0000 mg | Freq: Once | INTRAVENOUS | Status: AC
  Administered 2021-05-28: 10 mg via INTRAVENOUS
  Filled 2021-05-28: qty 10

## 2021-05-28 MED ORDER — SODIUM CHLORIDE 0.9 % IV SOLN: Freq: Once | INTRAVENOUS | Status: AC

## 2021-05-28 MED ORDER — DIPHENHYDRAMINE HCL 50 MG/ML IJ SOLN
50.0000 mg | Freq: Once | INTRAMUSCULAR | Status: AC
  Administered 2021-05-28: 50 mg via INTRAVENOUS
  Filled 2021-05-28: qty 1

## 2021-05-28 MED ORDER — FAMOTIDINE 20 MG IN NS 100 ML IVPB
20.0000 mg | Freq: Once | INTRAVENOUS | Status: AC
  Administered 2021-05-28: 20 mg via INTRAVENOUS
  Filled 2021-05-28: qty 20

## 2021-05-28 NOTE — Patient Instructions (Addendum)
Alexander [name]  Discharge Instructions: Thank you for choosing [facility] to provide your oncology and hematology care.  If you have a lab appointment with the [facility], please go directly to the [facility] and check in at the registration area.   Wear comfortable clothing and clothing appropriate for easy access to any Portacath or PICC line.   We strive to give you quality time with your provider. You may need to reschedule your appointment if you arrive late (15 or more minutes).  Arriving late affects you and other patients whose appointments are after yours.  Also, if you miss three or more appointments without notifying the office, you may be dismissed from the clinic at the providers discretion.      For prescription refill requests, have your pharmacy contact our office and allow 72 hours for refills to be completed.    Today you received the following chemotherapy and/or immunotherapy agents ramucirumab/taxol   To help prevent nausea and vomiting after your treatment, we encourage you to take your nausea medication as directed.  BELOW ARE SYMPTOMS THAT SHOULD BE REPORTED IMMEDIATELY: *FEVER GREATER THAN 100.4 F (38 C) OR HIGHER *CHILLS OR SWEATING *NAUSEA AND VOMITING THAT IS NOT CONTROLLED WITH YOUR NAUSEA MEDICATION *UNUSUAL SHORTNESS OF BREATH *UNUSUAL BRUISING OR BLEEDING *URINARY PROBLEMS (pain or burning when urinating, or frequent urination) *BOWEL PROBLEMS (unusual diarrhea, constipation, pain near the anus) TENDERNESS IN MOUTH AND THROAT WITH OR WITHOUT PRESENCE OF ULCERS (sore throat, sores in mouth, or a toothache) UNUSUAL RASH, SWELLING OR PAIN  UNUSUAL VAGINAL DISCHARGE OR ITCHING   Items with * indicate a potential emergency and should be followed up as soon as possible or go to the Emergency Department if any problems should occur.  Please show the CHEMOTHERAPY ALERT CARD or IMMUNOTHERAPY ALERT CARD at check-in to the  Emergency Department and triage nurse.  Should you have questions after your visit or need to cancel or reschedule your appointment, please contact [facility]  Dept: [phone]  and follow the prompts.  Office hours are 8:00 a.m. to 4:30 p.m. Monday - Friday. Please note that voicemails left after 4:00 p.m. may not be returned until the following business day.  We are closed weekends and major holidays. You have access to a nurse at all times for urgent questions. Please call the main number to the clinic Dept: [phone] and follow the prompts.  For any non-urgent questions, you may also contact your provider using MyChart. We now offer e-Visits for anyone 31 and older to request care online for non-urgent symptoms. For details visit [email or web address].   Also download the MyChart app! Go to the app store, search "MyChart", open the app, select [facility], and log in with your MyChart username and password.  Due to Covid, a mask is required upon entering the hospital/clinic. If you do not have a mask, one will be given to you upon arrival. For doctor visits, patients may have 1 support person aged 11 or older with them. For treatment visits, patients cannot have anyone with them due to current Covid guidelines and our immunocompromised population.   Ramucirumab injection What is this medication? RAMUCIRUMAB (ra mue SIR ue mab) is a monoclonal antibody. It is used to treat stomach cancer, colorectal cancer, liver cancer, and lung cancer. This medicine may be used for other purposes; ask your health care provider or pharmacist if you have questions. COMMON BRAND NAME(S): Cyramza What  should I tell my care team before I take this medication? They need to know if you have any of these conditions: bleeding disorders blood clots heart disease, including heart failure, heart attack, or chest pain (angina) high blood pressure infection (especially a virus infection  such as chickenpox, cold sores, or herpes) protein in your urine recent or planning to have surgery stroke an unusual or allergic reaction to ramucirumab, other medicines, foods, dyes, or preservatives pregnant or trying to get pregnant breast-feeding How should I use this medication? This medicine is for infusion into a vein. It is given by a health care professional in a hospital or clinic setting. Talk to your pediatrician regarding the use of this medicine in children. Special care may be needed. Overdosage: If you think you have taken too much of this medicine contact a poison control center or emergency room at once. NOTE: This medicine is only for you. Do not share this medicine with others. What if I miss a dose? It is important not to miss your dose. Call your doctor or health care professional if you are unable to keep an appointment. What may interact with this medication? Interactions have not been studied. This list may not describe all possible interactions. Give your health care provider a list of all the medicines, herbs, non-prescription drugs, or dietary supplements you use. Also tell them if you smoke, drink alcohol, or use illegal drugs. Some items may interact with your medicine. What should I watch for while using this medication? Your condition will be monitored carefully while you are receiving this medicine. You will need to to check your blood pressure and have your blood and urine tested while you are taking this medicine. Your condition will be monitored carefully while you are receiving this medicine. This medicine may increase your risk to bruise or bleed. Call your doctor or health care professional if you notice any unusual bleeding. Before having surgery, talk to your health care provider to make sure it is ok. This drug can increase the risk of poor healing of your surgical site or wound. You will need to stop this drug for 28 days before surgery. After surgery,  wait at least 2 weeks before restarting this drug. Make sure the surgical site or wound is healed enough before restarting this drug. Talk to your health care provider if questions. Do not become pregnant while taking this medicine or for 3 months after stopping it. Women should inform their doctor if they wish to become pregnant or think they might be pregnant. There is a potential for serious side effects to an unborn child. Talk to your health care professional or pharmacist for more information. Do not breast-feed an infant while taking this medicine or for 2 months after stopping it. This medicine may interfere with the ability to have a child. Talk with your doctor or health care professional if you are concerned about your fertility. What side effects may I notice from receiving this medication? Side effects that you should report to your doctor or health care professional as soon as possible: allergic reactions like skin rash, itching or hives, breathing problems, swelling of the face, lips, or tongue signs of infection - fever or chills, cough, sore throat chest pain or chest tightness confusion dizziness feeling faint or lightheaded, falls severe abdominal pain severe nausea, vomiting signs and symptoms of bleeding such as bloody or black, tarry stools; red or dark-brown urine; spitting up blood or brown material that looks like coffee  grounds; red spots on the skin; unusual bruising or bleeding from the eye, gums, or nose signs and symptoms of a blood clot such as breathing problems; changes in vision; chest pain; severe, sudden headache; pain, swelling, warmth in the leg; trouble speaking; sudden numbness or weakness of the face, arm or leg symptoms of a stroke: change in mental awareness, inability to talk or move one side of the body trouble walking, dizziness, loss of balance or coordination Side effects that usually do not require medical attention (report to your doctor or health  care professional if they continue or are bothersome): cold, clammy skin constipation diarrhea headache nausea, vomiting stomach pain unusually slow heartbeat unusually weak or tired This list may not describe all possible side effects. Call your doctor for medical advice about side effects. You may report side effects to FDA at 1-800-FDA-1088. Where should I keep my medication? This drug is given in a hospital or clinic and will not be stored at home. NOTE: This sheet is a summary. It may not cover all possible information. If you have questions about this medicine, talk to your doctor, pharmacist, or health care provider.  2022 Elsevier/Gold Standard (2021-01-21 00:00:00) Paclitaxel injection What is this medication? PACLITAXEL (PAK li TAX el) is a chemotherapy drug. It targets fast dividing cells, like cancer cells, and causes these cells to die. This medicine is used to treat ovarian cancer, breast cancer, lung cancer, Kaposi's sarcoma, and other cancers. This medicine may be used for other purposes; ask your health care provider or pharmacist if you have questions. COMMON BRAND NAME(S): Onxol, Taxol What should I tell my care team before I take this medication? They need to know if you have any of these conditions: history of irregular heartbeat liver disease low blood counts, like low white cell, platelet, or red cell counts lung or breathing disease, like asthma tingling of the fingers or toes, or other nerve disorder an unusual or allergic reaction to paclitaxel, alcohol, polyoxyethylated castor oil, other chemotherapy, other medicines, foods, dyes, or preservatives pregnant or trying to get pregnant breast-feeding How should I use this medication? This drug is given as an infusion into a vein. It is administered in a hospital or clinic by a specially trained health care professional. Talk to your pediatrician regarding the use of this medicine in children. Special care may be  needed. Overdosage: If you think you have taken too much of this medicine contact a poison control center or emergency room at once. NOTE: This medicine is only for you. Do not share this medicine with others. What if I miss a dose? It is important not to miss your dose. Call your doctor or health care professional if you are unable to keep an appointment. What may interact with this medication? Do not take this medicine with any of the following medications: live virus vaccines This medicine may also interact with the following medications: antiviral medicines for hepatitis, HIV or AIDS certain antibiotics like erythromycin and clarithromycin certain medicines for fungal infections like ketoconazole and itraconazole certain medicines for seizures like carbamazepine, phenobarbital, phenytoin gemfibrozil nefazodone rifampin St. John's wort This list may not describe all possible interactions. Give your health care provider a list of all the medicines, herbs, non-prescription drugs, or dietary supplements you use. Also tell them if you smoke, drink alcohol, or use illegal drugs. Some items may interact with your medicine. What should I watch for while using this medication? Your condition will be monitored carefully while you are  receiving this medicine. You will need important blood work done while you are taking this medicine. This medicine can cause serious allergic reactions. To reduce your risk you will need to take other medicine(s) before treatment with this medicine. If you experience allergic reactions like skin rash, itching or hives, swelling of the face, lips, or tongue, tell your doctor or health care professional right away. In some cases, you may be given additional medicines to help with side effects. Follow all directions for their use. This drug may make you feel generally unwell. This is not uncommon, as chemotherapy can affect healthy cells as well as cancer cells. Report any  side effects. Continue your course of treatment even though you feel ill unless your doctor tells you to stop. Call your doctor or health care professional for advice if you get a fever, chills or sore throat, or other symptoms of a cold or flu. Do not treat yourself. This drug decreases your body's ability to fight infections. Try to avoid being around people who are sick. This medicine may increase your risk to bruise or bleed. Call your doctor or health care professional if you notice any unusual bleeding. Be careful brushing and flossing your teeth or using a toothpick because you may get an infection or bleed more easily. If you have any dental work done, tell your dentist you are receiving this medicine. Avoid taking products that contain aspirin, acetaminophen, ibuprofen, naproxen, or ketoprofen unless instructed by your doctor. These medicines may hide a fever. Do not become pregnant while taking this medicine. Women should inform their doctor if they wish to become pregnant or think they might be pregnant. There is a potential for serious side effects to an unborn child. Talk to your health care professional or pharmacist for more information. Do not breast-feed an infant while taking this medicine. Men are advised not to father a child while receiving this medicine. This product may contain alcohol. Ask your pharmacist or healthcare provider if this medicine contains alcohol. Be sure to tell all healthcare providers you are taking this medicine. Certain medicines, like metronidazole and disulfiram, can cause an unpleasant reaction when taken with alcohol. The reaction includes flushing, headache, nausea, vomiting, sweating, and increased thirst. The reaction can last from 30 minutes to several hours. What side effects may I notice from receiving this medication? Side effects that you should report to your doctor or health care professional as soon as possible: allergic reactions like skin rash,  itching or hives, swelling of the face, lips, or tongue breathing problems changes in vision fast, irregular heartbeat high or low blood pressure mouth sores pain, tingling, numbness in the hands or feet signs of decreased platelets or bleeding - bruising, pinpoint red spots on the skin, black, tarry stools, blood in the urine signs of decreased red blood cells - unusually weak or tired, feeling faint or lightheaded, falls signs of infection - fever or chills, cough, sore throat, pain or difficulty passing urine signs and symptoms of liver injury like dark yellow or brown urine; general ill feeling or flu-like symptoms; light-colored stools; loss of appetite; nausea; right upper belly pain; unusually weak or tired; yellowing of the eyes or skin swelling of the ankles, feet, hands unusually slow heartbeat Side effects that usually do not require medical attention (report to your doctor or health care professional if they continue or are bothersome): diarrhea hair loss loss of appetite muscle or joint pain nausea, vomiting pain, redness, or irritation at site where  injected tiredness This list may not describe all possible side effects. Call your doctor for medical advice about side effects. You may report side effects to FDA at 1-800-FDA-1088. Where should I keep my medication? This drug is given in a hospital or clinic and will not be stored at home. NOTE: This sheet is a summary. It may not cover all possible information. If you have questions about this medicine, talk to your doctor, pharmacist, or health care provider.  2022 Elsevier/Gold Standard (2021-01-21 00:00:00) Potassium Chloride Injection What is this medication? POTASSIUM CHLORIDE (poe TASS i um KLOOR ide) prevents and treats low levels of potassium in your body. Potassium plays an important role in maintaining the health of your kidneys, heart, muscles, and nervous system. This medicine may be used for other purposes; ask  your health care provider or pharmacist if you have questions. COMMON BRAND NAME(S): PROAMP What should I tell my care team before I take this medication? They need to know if you have any of these conditions: Addison disease Dehydration Diabetes (high blood sugar) Heart disease High levels of potassium in the blood Irregular heartbeat or rhythm Kidney disease Large areas of burned skin An unusual or allergic reaction to potassium, other medications, foods, dyes, or preservatives Pregnant or trying to get pregnant Breast-feeding How should I use this medication? This medication is injected into a vein. It is given in a hospital or clinic setting. Talk to your care team about the use of this medication in children. Special care may be needed. Overdosage: If you think you have taken too much of this medicine contact a poison control center or emergency room at once. NOTE: This medicine is only for you. Do not share this medicine with others. What if I miss a dose? This does not apply. This medication is not for regular use. What may interact with this medication? Do not take this medication with any of the following: Certain diuretics such as spironolactone, triamterene Eplerenone Sodium polystyrene sulfonate This medication may also interact with the following: Certain medications for blood pressure or heart disease like lisinopril, losartan, quinapril, valsartan Medications that lower your chance of fighting infection such as cyclosporine, tacrolimus NSAIDs, medications for pain and inflammation, like ibuprofen or naproxen Other potassium supplements Salt substitutes This list may not describe all possible interactions. Give your health care provider a list of all the medicines, herbs, non-prescription drugs, or dietary supplements you use. Also tell them if you smoke, drink alcohol, or use illegal drugs. Some items may interact with your medicine. What should I watch for while  using this medication? Visit your care team for regular checks on your progress. Tell your care team if your symptoms do not start to get better or if they get worse. You may need blood work while you are taking this medication. Avoid salt substitutes unless you are told otherwise by your care team. What side effects may I notice from receiving this medication? Side effects that you should report to your care team as soon as possible: Allergic reactions--skin rash, itching, hives, swelling of the face, lips, tongue, or throat High potassium level--muscle weakness, fast or irregular heartbeat Side effects that usually do not require medical attention (report to your care team if they continue or are bothersome): Diarrhea Nausea Stomach pain Vomiting This list may not describe all possible side effects. Call your doctor for medical advice about side effects. You may report side effects to FDA at 1-800-FDA-1088. Where should I keep my medication? This  medication is given in a hospital or clinic. It will not be stored at home. NOTE: This sheet is a summary. It may not cover all possible information. If you have questions about this medicine, talk to your doctor, pharmacist, or health care provider.  2022 Elsevier/Gold Standard (2020-08-20 00:00:00) Hypokalemia Hypokalemia means that the amount of potassium in the blood is lower than normal. Potassium is a chemical (electrolyte) that helps regulate the amount of fluid in the body. It also stimulates muscle tightening (contraction) and helps nerves work properly. Normally, most of the body's potassium is inside cells, and only a very small amount is in the blood. Because the amount in the blood is so small, minor changes to potassium levels in the blood can be life-threatening. What are the causes? This condition may be caused by: Antibiotic medicine. Diarrhea or vomiting. Taking too much of a medicine that helps you have a bowel movement  (laxative) can cause diarrhea and lead to hypokalemia. Chronic kidney disease (CKD). Medicines that help the body get rid of excess fluid (diuretics). Eating disorders, such as bulimia. Low magnesium levels in the body. Sweating a lot. What are the signs or symptoms? Symptoms of this condition include: Weakness. Constipation. Fatigue. Muscle cramps. Mental confusion. Skipped heartbeats or irregular heartbeat (palpitations). Tingling or numbness. How is this diagnosed? This condition is diagnosed with a blood test. How is this treated? This condition may be treated by: Taking potassium supplements by mouth. Adjusting the medicines that you take. Eating more foods that contain a lot of potassium. If your potassium level is very low, you may need to get potassium through an IV and be monitored in the hospital. Follow these instructions at home:  Take over-the-counter and prescription medicines only as told by your health care provider. This includes vitamins and supplements. Eat a healthy diet. A healthy diet includes fresh fruits and vegetables, whole grains, healthy fats, and lean proteins. If instructed, eat more foods that contain a lot of potassium. This includes: Nuts, such as peanuts and pistachios. Seeds, such as sunflower seeds and pumpkin seeds. Peas, lentils, and lima beans. Whole grain and bran cereals and breads. Fresh fruits and vegetables, such as apricots, avocado, bananas, cantaloupe, kiwi, oranges, tomatoes, asparagus, and potatoes. Orange juice. Tomato juice. Red meats. Yogurt. Keep all follow-up visits as told by your health care provider. This is important. Contact a health care provider if you: Have weakness that gets worse. Feel your heart pounding or racing. Vomit. Have diarrhea. Have diabetes (diabetes mellitus) and you have trouble keeping your blood sugar (glucose) in your target range. Get help right away if you: Have chest pain. Have shortness  of breath. Have vomiting or diarrhea that lasts for more than 2 days. Faint. Summary Hypokalemia means that the amount of potassium in the blood is lower than normal. This condition is diagnosed with a blood test. Hypokalemia may be treated by taking potassium supplements, adjusting the medicines that you take, or eating more foods that are high in potassium. If your potassium level is very low, you may need to get potassium through an IV and be monitored in the hospital. This information is not intended to replace advice given to you by your health care provider. Make sure you discuss any questions you have with your health care provider. Document Revised: 12/14/2017 Document Reviewed: 12/15/2017 Elsevier Patient Education  [employer].

## 2021-05-29 ENCOUNTER — Telehealth: Payer: Self-pay

## 2021-05-29 NOTE — Telephone Encounter (Signed)
I spoke to pt's wife, Alexander [name], to see how he is doing since first infusion yesterday. Pt had a little nausea - but he took compazine & it was relieved. No headache, emesis, diarrhea, mouth sores, nor arthralgias/myalgias. I reminded her to call us if he develops a temp of 100.4 or higher, Day or Night. She verbalized understanding.  She mentioned that pt isn't taking the Megace, because it was $40 + , after insurance. He is also only taking MS Contin @ HS, and using 2-3 oxycodone PRN during the day, to keep from being too sleepy.

## 2021-06-02 ENCOUNTER — Telehealth: Payer: Self-pay

## 2021-06-02 NOTE — Telephone Encounter (Signed)
Patients wife called to inform us that patient has decided not to do Chemotherapy anymore. Dr. Bobby Rumpf and Raelyn Ensign, CMA made aware.

## 2021-06-03 ENCOUNTER — Other Ambulatory Visit: Payer: Self-pay | Admitting: Pharmacist

## 2021-06-03 ENCOUNTER — Encounter: Payer: Self-pay | Admitting: Oncology

## 2021-06-03 ENCOUNTER — Other Ambulatory Visit: Payer: PPO

## 2021-06-03 MED FILL — Dexamethasone Sodium Phosphate Inj 100 MG/10ML: INTRAMUSCULAR | Qty: 1 | Status: AC

## 2021-06-03 MED FILL — Famotidine in NaCl 0.9% IV Soln 20 MG/50ML: INTRAVENOUS | Qty: 100 | Status: AC

## 2021-06-04 ENCOUNTER — Telehealth: Payer: Self-pay | Admitting: Oncology

## 2021-06-04 ENCOUNTER — Inpatient Hospital Stay: Payer: PPO

## 2021-06-04 NOTE — Telephone Encounter (Signed)
Patient did not show for labs 01/17, treatment was canceled for 01/18. Will reschedule, pharmacy and RN notified.

## 2021-06-10 ENCOUNTER — Other Ambulatory Visit: Payer: PPO

## 2021-06-10 ENCOUNTER — Ambulatory Visit: Payer: PPO | Admitting: Hematology and Oncology

## 2021-06-11 ENCOUNTER — Ambulatory Visit: Payer: PPO

## 2021-06-23 ENCOUNTER — Ambulatory Visit: Payer: PPO | Admitting: Oncology

## 2021-06-23 ENCOUNTER — Other Ambulatory Visit: Payer: PPO

## 2021-06-25 ENCOUNTER — Ambulatory Visit: Payer: PPO

## 2021-07-16 DEATH — deceased

## 2023-03-11 IMAGING — DX DG CHEST 1V PORT
1 series · 1 of 1 positions shown · non-contrast
Comparison: Chest CT September 05, 2020

CLINICAL DATA: Cough and shortness of breath

EXAM:
PORTABLE CHEST 1 VIEW

[chest ap]
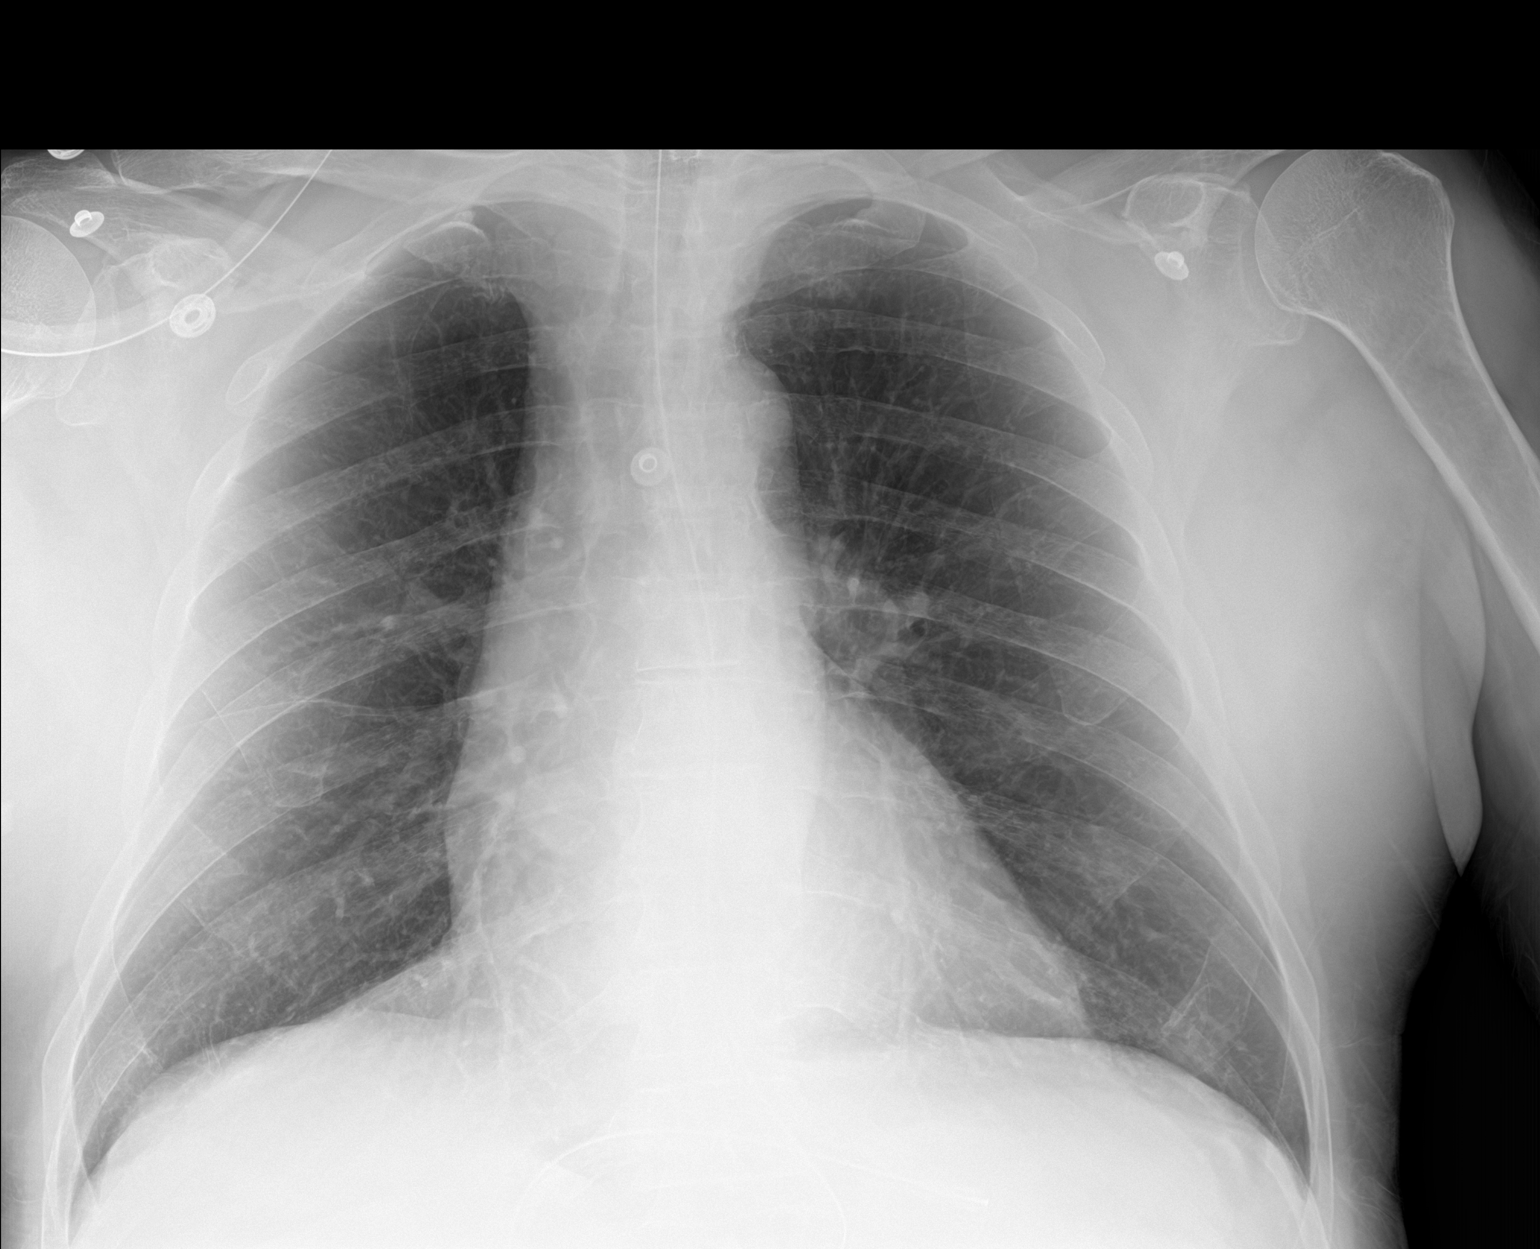

[1 of 1 positions shown; findings below may reference images not displayed]

FINDINGS: Nasogastric tube tip and side port in stomach. No edema or airspace
opacity. Heart size and pulmonary vascularity are normal. No
adenopathy. No bone lesions.
IMPRESSION: Nasogastric tube tip and side port in stomach. Lungs clear. Cardiac
silhouette within normal limits.

## 2023-03-15 IMAGING — DX DG ABDOMEN 1V
1 series · 1 of 1 positions shown · non-contrast
Comparison: September 12, 2020

CLINICAL DATA: Status post nasogastric tube placement

EXAM:
ABDOMEN - 1 VIEW

[abdomen]
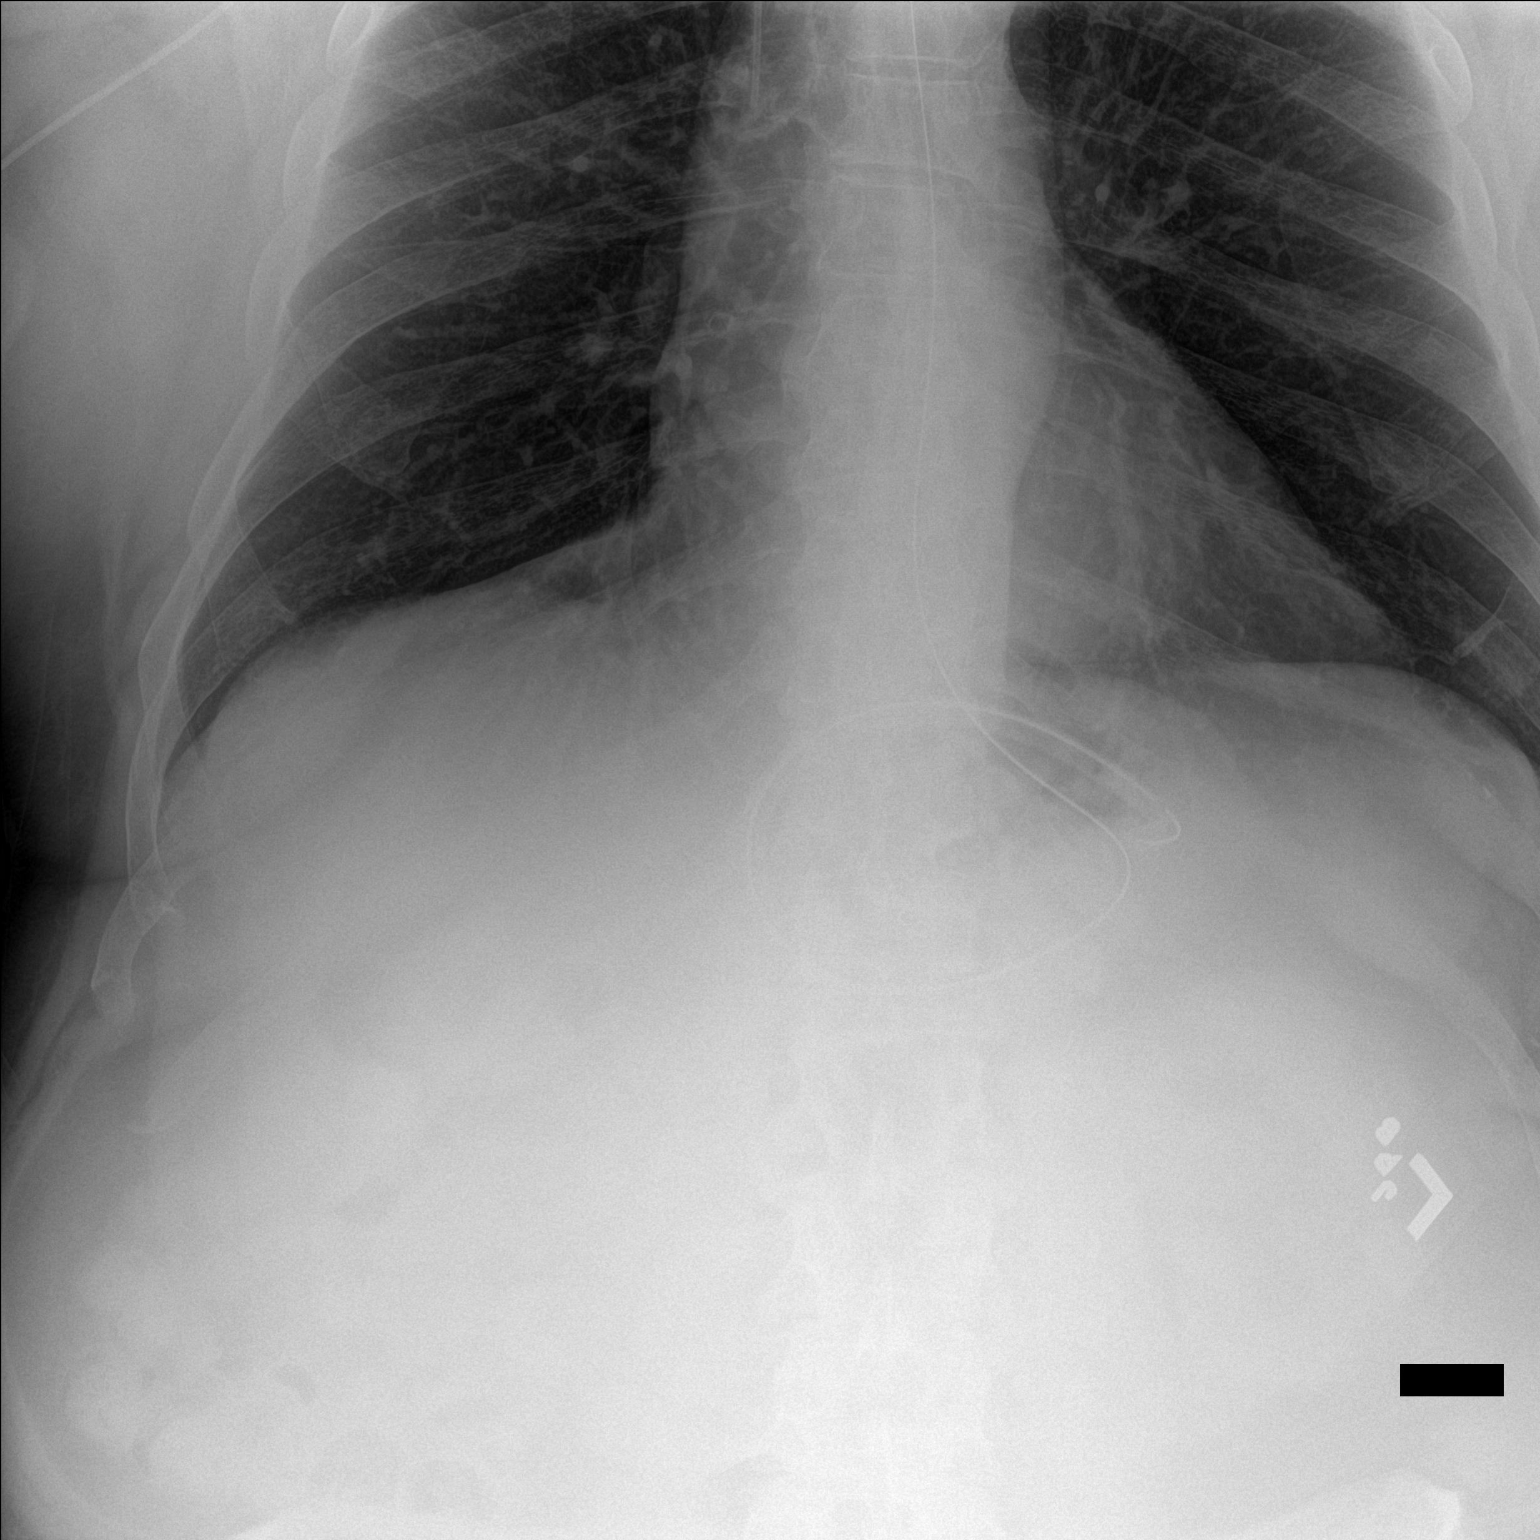

[1 of 1 positions shown; findings below may reference images not displayed]

FINDINGS: Nasogastric tube with tip and side port overlying the stomach. The
bowel gas pattern is normal. No radio-opaque calculi or other
significant radiographic abnormality are seen.
IMPRESSION: Nasogastric tube with tip and side port overlying the stomach.
